# Patient Record
Sex: Female | Born: 1945
Health system: Southern US, Community
[De-identification: ages and names within clinical notes are randomized; demographics above are authoritative.]

## PROBLEM LIST (undated history)

## (undated) DIAGNOSIS — M25552 Pain in left hip: Secondary | ICD-10-CM

## (undated) DIAGNOSIS — H9319 Tinnitus, unspecified ear: Secondary | ICD-10-CM

## (undated) DIAGNOSIS — M26629 Arthralgia of temporomandibular joint, unspecified side: Secondary | ICD-10-CM

## (undated) DIAGNOSIS — E785 Hyperlipidemia, unspecified: Secondary | ICD-10-CM

## (undated) DIAGNOSIS — R42 Dizziness and giddiness: Secondary | ICD-10-CM

## (undated) DIAGNOSIS — R011 Cardiac murmur, unspecified: Secondary | ICD-10-CM

## (undated) DIAGNOSIS — I10 Essential (primary) hypertension: Secondary | ICD-10-CM

## (undated) DIAGNOSIS — M169 Osteoarthritis of hip, unspecified: Secondary | ICD-10-CM

## (undated) DIAGNOSIS — E559 Vitamin D deficiency, unspecified: Secondary | ICD-10-CM

## (undated) DIAGNOSIS — M8580 Other specified disorders of bone density and structure, unspecified site: Secondary | ICD-10-CM

## (undated) DIAGNOSIS — R413 Other amnesia: Secondary | ICD-10-CM

## (undated) HISTORY — DX: Arthralgia of temporomandibular joint, unspecified side: M26.629

## (undated) HISTORY — DX: Tinnitus, unspecified ear: H93.19

## (undated) HISTORY — PX: COLONOSCOPY: SHX174

## (undated) HISTORY — DX: Hyperlipidemia, unspecified: E78.5

## (undated) HISTORY — PX: TUBAL LIGATION: SHX77

## (undated) HISTORY — DX: Pain in left hip: M25.552

## (undated) HISTORY — DX: Other specified disorders of bone density and structure, unspecified site: M85.80

## (undated) HISTORY — DX: Other amnesia: R41.3

## (undated) HISTORY — DX: Cardiac murmur, unspecified: R01.1

## (undated) HISTORY — DX: Essential (primary) hypertension: I10

## (undated) HISTORY — DX: Vitamin D deficiency, unspecified: E55.9

---

## 1998-03-27 ENCOUNTER — Ambulatory Visit (HOSPITAL_COMMUNITY): Admission: RE | Admit: 1998-03-27 | Discharge: 1998-03-27 | Payer: Self-pay | Admitting: *Deleted

## 1999-04-10 ENCOUNTER — Other Ambulatory Visit: Admission: RE | Admit: 1999-04-10 | Discharge: 1999-04-10 | Payer: Self-pay | Admitting: *Deleted

## 1999-05-23 ENCOUNTER — Ambulatory Visit (HOSPITAL_COMMUNITY): Admission: RE | Admit: 1999-05-23 | Discharge: 1999-05-23 | Payer: Self-pay | Admitting: *Deleted

## 1999-07-17 ENCOUNTER — Emergency Department (HOSPITAL_COMMUNITY): Admission: EM | Admit: 1999-07-17 | Discharge: 1999-07-17 | Payer: Self-pay | Admitting: Emergency Medicine

## 2000-06-02 ENCOUNTER — Ambulatory Visit (HOSPITAL_COMMUNITY): Admission: RE | Admit: 2000-06-02 | Discharge: 2000-06-02 | Payer: Self-pay | Admitting: Internal Medicine

## 2000-06-02 ENCOUNTER — Encounter: Payer: Self-pay | Admitting: Internal Medicine

## 2001-10-10 ENCOUNTER — Encounter: Payer: Self-pay | Admitting: Internal Medicine

## 2001-10-10 ENCOUNTER — Ambulatory Visit (HOSPITAL_COMMUNITY): Admission: RE | Admit: 2001-10-10 | Discharge: 2001-10-10 | Payer: Self-pay | Admitting: Internal Medicine

## 2002-07-14 ENCOUNTER — Encounter: Payer: Self-pay | Admitting: Internal Medicine

## 2002-07-14 ENCOUNTER — Ambulatory Visit (HOSPITAL_COMMUNITY): Admission: RE | Admit: 2002-07-14 | Discharge: 2002-07-14 | Payer: Self-pay | Admitting: Internal Medicine

## 2004-10-31 ENCOUNTER — Encounter: Admission: RE | Admit: 2004-10-31 | Discharge: 2004-10-31 | Payer: Self-pay | Admitting: Neurology

## 2005-01-13 ENCOUNTER — Encounter: Admission: RE | Admit: 2005-01-13 | Discharge: 2005-01-13 | Payer: Self-pay | Admitting: Neurology

## 2006-02-22 ENCOUNTER — Ambulatory Visit (HOSPITAL_COMMUNITY): Admission: RE | Admit: 2006-02-22 | Discharge: 2006-02-22 | Payer: Self-pay | Admitting: Neurology

## 2007-09-24 ENCOUNTER — Emergency Department (HOSPITAL_COMMUNITY): Admission: EM | Admit: 2007-09-24 | Discharge: 2007-09-24 | Payer: Self-pay | Admitting: Family Medicine

## 2007-12-06 ENCOUNTER — Encounter: Admission: RE | Admit: 2007-12-06 | Discharge: 2007-12-06 | Payer: Self-pay | Admitting: Family Medicine

## 2010-06-05 LAB — HM PAP SMEAR: HM Pap smear: NEGATIVE

## 2011-02-18 ENCOUNTER — Emergency Department (HOSPITAL_COMMUNITY): Payer: Self-pay

## 2011-02-18 ENCOUNTER — Emergency Department (HOSPITAL_COMMUNITY)
Admission: EM | Admit: 2011-02-18 | Discharge: 2011-02-18 | Disposition: A | Discharge status: Other Acute Inpatient Hospital | Payer: Self-pay | Source: Home / Self Care | Attending: Emergency Medicine | Admitting: Emergency Medicine

## 2011-02-18 ENCOUNTER — Inpatient Hospital Stay (HOSPITAL_COMMUNITY)
Admission: EM | Admit: 2011-02-18 | Discharge: 2011-02-20 | DRG: 313 | Disposition: A | Discharge status: Home / Self Care | Payer: Self-pay | Source: Other Acute Inpatient Hospital | Attending: Cardiology | Admitting: Cardiology

## 2011-02-18 DIAGNOSIS — R0789 Other chest pain: Principal | ICD-10-CM | POA: Diagnosis present

## 2011-02-18 DIAGNOSIS — Z8249 Family history of ischemic heart disease and other diseases of the circulatory system: Secondary | ICD-10-CM

## 2011-02-18 DIAGNOSIS — R748 Abnormal levels of other serum enzymes: Secondary | ICD-10-CM | POA: Insufficient documentation

## 2011-02-18 DIAGNOSIS — I498 Other specified cardiac arrhythmias: Secondary | ICD-10-CM | POA: Insufficient documentation

## 2011-02-18 DIAGNOSIS — F17200 Nicotine dependence, unspecified, uncomplicated: Secondary | ICD-10-CM | POA: Diagnosis present

## 2011-02-18 DIAGNOSIS — M549 Dorsalgia, unspecified: Secondary | ICD-10-CM | POA: Insufficient documentation

## 2011-02-18 DIAGNOSIS — I10 Essential (primary) hypertension: Secondary | ICD-10-CM | POA: Insufficient documentation

## 2011-02-18 DIAGNOSIS — E7800 Pure hypercholesterolemia, unspecified: Secondary | ICD-10-CM | POA: Diagnosis present

## 2011-02-18 DIAGNOSIS — R079 Chest pain, unspecified: Secondary | ICD-10-CM | POA: Insufficient documentation

## 2011-02-18 DIAGNOSIS — K219 Gastro-esophageal reflux disease without esophagitis: Secondary | ICD-10-CM | POA: Diagnosis present

## 2011-02-18 LAB — CARDIAC PANEL(CRET KIN+CKTOT+MB+TROPI)
Relative Index: 3.5 — ABNORMAL HIGH (ref 0.0–2.5)
Troponin I: <0.3 ng/mL (ref <0.30)

## 2011-02-18 LAB — PROTIME-INR: INR: 0.89 (ref 0.00–1.49)

## 2011-02-18 LAB — CK TOTAL AND CKMB (NOT AT ARMC)
CK, MB: 6.4 ng/mL (ref 0.3–4.0)
Relative Index: 3.5 — ABNORMAL HIGH (ref 0.0–2.5)

## 2011-02-18 LAB — COMPREHENSIVE METABOLIC PANEL
AST: 31 U/L (ref 0–37)
Albumin: 4.4 g/dL (ref 3.5–5.2)
CO2: 26 mEq/L (ref 19–32)
GFR calc Af Amer: >60 mL/min (ref >60)
GFR calc non Af Amer: >60 mL/min (ref >60)
Potassium: 3.6 mEq/L (ref 3.5–5.1)

## 2011-02-18 LAB — DIFFERENTIAL
Lymphs Abs: 2.4 10*3/uL (ref 0.7–4.0)
Monocytes Absolute: 0.4 10*3/uL (ref 0.1–1.0)
Monocytes Relative: 7 % (ref 3–12)
Neutro Abs: 2.8 10*3/uL (ref 1.7–7.7)

## 2011-02-18 LAB — CBC
Hemoglobin: 13.9 g/dL (ref 12.0–15.0)
MCH: 32 pg (ref 26.0–34.0)
RBC: 4.34 MIL/uL (ref 3.87–5.11)
RDW: 13 % (ref 11.5–15.5)
WBC: 5.8 10*3/uL (ref 4.0–10.5)

## 2011-02-18 LAB — APTT: aPTT: 31 seconds (ref 24–37)

## 2011-02-18 LAB — TROPONIN I: Troponin I: <0.3 ng/mL (ref <0.30)

## 2011-02-19 ENCOUNTER — Inpatient Hospital Stay (HOSPITAL_COMMUNITY): Payer: Self-pay

## 2011-02-19 LAB — LIPID PANEL
HDL: 89 mg/dL (ref >39)
Total CHOL/HDL Ratio: 2.2 RATIO

## 2011-02-19 LAB — CARDIAC PANEL(CRET KIN+CKTOT+MB+TROPI)
CK, MB: 4.6 ng/mL — ABNORMAL HIGH (ref 0.3–4.0)
Relative Index: 3.4 — ABNORMAL HIGH (ref 0.0–2.5)

## 2011-02-19 LAB — CBC
MCH: 32 pg (ref 26.0–34.0)
MCV: 91.7 fL (ref 78.0–100.0)
RDW: 13 % (ref 11.5–15.5)
WBC: 5.7 10*3/uL (ref 4.0–10.5)

## 2011-02-19 LAB — BASIC METABOLIC PANEL
BUN: 9 mg/dL (ref 6–23)
CO2: 26 mEq/L (ref 19–32)
Creatinine, Ser: 0.7 mg/dL (ref 0.4–1.2)
Potassium: 3.5 mEq/L (ref 3.5–5.1)

## 2011-02-19 LAB — HEPARIN LEVEL (UNFRACTIONATED): Heparin Unfractionated: 0.94 IU/mL — ABNORMAL HIGH (ref 0.30–0.70)

## 2011-02-19 MED ORDER — TECHNETIUM TC 99M TETROFOSMIN IV KIT
30.0000 | PACK | Freq: Once | INTRAVENOUS | Status: AC | PRN
  Administered 2011-02-19: 30 via INTRAVENOUS

## 2011-02-19 MED ORDER — TECHNETIUM TC 99M TETROFOSMIN IV KIT
10.0000 | PACK | Freq: Once | INTRAVENOUS | Status: AC | PRN
  Administered 2011-02-19: 10 via INTRAVENOUS

## 2011-02-20 ENCOUNTER — Other Ambulatory Visit (HOSPITAL_COMMUNITY): Payer: Self-pay

## 2011-03-12 NOTE — Discharge Summary (Signed)
NAMESHIANNA, BALLY              ACCOUNT NO.:  [id number]  MEDICAL RECORD NO.:  [id number]           PATIENT TYPE:  I  LOCATION:  3740                         FACILITY:  [facility]  PHYSICIAN:  Eadie Repetto N. Sharyn Lull, M.D. DATE OF BIRTH:  05/23/1946  DATE OF ADMISSION:  02/18/2011 DATE OF DISCHARGE:  02/20/2011                              DISCHARGE SUMMARY   ADMITTING DIAGNOSES: 1. Chest pain, rule out myocardial infarction. 2. Uncontrolled hypertension. 3. Hypercholesteremia. 4. Tobacco abuse. 5. Positive family history of coronary artery disease.  DISCHARGE DIAGNOSES: 1. Status post atypical chest pain, myocardial infarction ruled out. 2. Negative Persantine Myoview. 3. Gastroesophageal reflux disease. 4. Hypertension. 5. Hypercholesteremia. 6. History of tobacco abuse. 7. Positive family history of coronary artery disease.  DISCHARGE HOME MEDICATIONS: 1. Percocet 1-2 tabs every 8 hours as needed for pain. 2. Lovastatin 20 mg 2 tablets daily by mouth at bedtime as before. 3. Nexium 40 mg 1 capsule daily. 4. Norvasc 10 mg 1 tablet daily. 5. Vitamin D2 50,000 units every week as before.  DIET:  Low salt, low cholesterol.  ACTIVITY:  As tolerated.  CONDITION:  At discharge, is table.  Follow up with me in 1 week.  BRIEF HISTORY AND HOSPITAL COURSE:  Ms. Debbra Riding is a 65 year old black female with past medical history significant for hypertension, hypercholesteremia.  She complained of right-sided chest pain, radiating to the back off and on since Sunday, states pain occasionally increases with movement, went to Urgent Care, was treated for GERD with Nexium without much relief, so decided to go to the ER.  The patient was noted to have minimally elevated CPK-MB and was transferred from [facility] to [facility] for further evaluation.  The patient denies any exertional chest pain or exertional dyspnea.  Denies PND, orthopnea, or leg swelling.  Denies palpitation,  lightheadedness, or syncope.  PAST MEDICAL HISTORY:  As above.  PAST SURGICAL HISTORY:  None.  ALLERGIES:  None.  MEDICATION AT HOME:  She was on amlodipine, Nexium, lovastatin, and vitamin D.  SOCIAL HISTORY:  She is married, has 2 children.  Smoked 1/2 pack per day for 25 years and now 1-2 cigarettes per day.  Drinks beer socially. 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  FAMILY HISTORY:  Mother died of massive MI at the age of 60.  One brother has heart problems.  She does not know about her dad.  PHYSICAL EXAMINATION:  GENERAL:  She was alert, awake, and oriented x3, in no acute distress. VITAL SIGNS:  Blood pressure was 141/127, pulse was 83. HEENT:  Conjunctivae pink. NECK:  Supple.  No JVD, no bruit. LUNGS:  Clear to auscultation without rhonchi or rales. CARDIOVASCULAR:  S1 and S2 was normal.  There was soft systolic murmur and S4 gallop. ABDOMEN:  Soft.  Bowel sounds were present, nontender. EXTREMITIES:  There is no clubbing, cyanosis, or edema.  LABS:  Hemoglobin was 13.9, hematocrit 40.4, white count of 5.8.  Sodium was 140, potassium 3.6, glucose 81, BUN 14, creatinine 0.89.  Two sets of cardiac enzymes were negative.  Cholesterol was 198, LDL was 94, HDL was 89.  Persantine  Myoview showed no evidence of reversible inducible ischemia with EF of 58%.  Chest x-ray showed no acute abnormalities.  BRIEF HOSPITAL COURSE:  The patient was admitted to telemetry unit.  MI was ruled out by serial enzymes and EKG.  The patient had vague right- sided musculoskeletal pain, was started on Percocet with relief of chest pain.  The patient underwent Persantine Myoview which showed no evidence of ischemia with normal ejection fraction.  The patient has been ambulating in hallway without any problems.  The patient will be discharged home on above medications and will be followed up in my office in 1 week.     Eduardo Osier. Sharyn Lull, M.D.     [facility]  D:  02/20/2011  T:  02/21/2011   Job:  [id number]  Electronically Signed by Rinaldo Cloud M.D. on 03/12/2011 09:42:33 AM

## 2011-07-29 ENCOUNTER — Other Ambulatory Visit: Payer: Self-pay | Admitting: Physician Assistant

## 2011-07-29 DIAGNOSIS — Z1231 Encounter for screening mammogram for malignant neoplasm of breast: Secondary | ICD-10-CM

## 2011-08-28 ENCOUNTER — Ambulatory Visit (HOSPITAL_COMMUNITY)
Admission: RE | Admit: 2011-08-28 | Discharge: 2011-08-28 | Disposition: A | Discharge status: Home / Self Care | Payer: Self-pay | Source: Ambulatory Visit | Attending: Physician Assistant | Admitting: Physician Assistant

## 2011-08-28 DIAGNOSIS — Z1231 Encounter for screening mammogram for malignant neoplasm of breast: Secondary | ICD-10-CM

## 2011-09-10 ENCOUNTER — Ambulatory Visit: Payer: Self-pay | Admitting: Physician Assistant

## 2011-09-10 DIAGNOSIS — I10 Essential (primary) hypertension: Secondary | ICD-10-CM

## 2011-09-10 DIAGNOSIS — E782 Mixed hyperlipidemia: Secondary | ICD-10-CM

## 2011-10-09 ENCOUNTER — Ambulatory Visit: Payer: Self-pay

## 2011-10-09 DIAGNOSIS — M545 Low back pain, unspecified: Secondary | ICD-10-CM

## 2011-10-09 DIAGNOSIS — S339XXA Sprain of unspecified parts of lumbar spine and pelvis, initial encounter: Secondary | ICD-10-CM

## 2012-02-05 ENCOUNTER — Ambulatory Visit: Payer: Self-pay | Admitting: Physician Assistant

## 2012-02-05 VITALS — BP 121/77 | HR 78 | Temp 98.1°F | Resp 18 | Ht 60.5 in | Wt 152.6 lb

## 2012-02-05 DIAGNOSIS — J329 Chronic sinusitis, unspecified: Secondary | ICD-10-CM

## 2012-02-05 DIAGNOSIS — R05 Cough: Secondary | ICD-10-CM

## 2012-02-05 DIAGNOSIS — J019 Acute sinusitis, unspecified: Secondary | ICD-10-CM

## 2012-02-05 DIAGNOSIS — N393 Stress incontinence (female) (male): Secondary | ICD-10-CM

## 2012-02-05 DIAGNOSIS — R059 Cough, unspecified: Secondary | ICD-10-CM

## 2012-02-05 MED ORDER — AMOXICILLIN 875 MG PO TABS: 1750.0000 mg | ORAL_TABLET | Freq: Two times a day (BID) | ORAL | Status: AC

## 2012-02-05 MED ORDER — GUAIFENESIN ER 1200 MG PO TB12: 1.0000 | ORAL_TABLET | Freq: Two times a day (BID) | ORAL | Status: DC | PRN

## 2012-02-05 MED ORDER — IPRATROPIUM BROMIDE 0.03 % NA SOLN: 2.0000 | Freq: Two times a day (BID) | NASAL | Status: DC

## 2012-02-05 MED ORDER — BENZONATATE 100 MG PO CAPS: 100.0000 mg | ORAL_CAPSULE | Freq: Three times a day (TID) | ORAL | Status: AC | PRN

## 2012-02-05 NOTE — Patient Instructions (Signed)
Get LOTS of rest and drink at least 64 ounces of water daily.  QUIT SMOKING!!!

## 2012-02-05 NOTE — Progress Notes (Signed)
  Subjective:    Patient ID: Brandi Harrington, female    DOB: [birth year], 66 y.o.   MRN: [id number]  HPI  Presents with 7 days of nasal/sinus congestion, fatigue and cough.  Cough produces white sputum.  Nasal mucous is light yellow.  No HA.  No fever or chills.  No diarrhea.  Urinary leakage with coughing.  Review of Systems As above.    Objective:   Physical Exam  Vital signs noted. Well-developed, well nourished BF who is awake, alert and oriented, in NAD. HEENT: /AT, PERRL, EOMI.  Sclera and conjunctiva are clear.  EAC are patent, TMs are normal in appearance. Nasal mucosa is pink and moist. OP is clear. Neck: supple, non-tender, no lymphadenopathey, thyromegaly. Heart: RRR, no murmur Lungs: with coarse wheezes throughout. Abdomen: normo-active bowel sounds, supple, non-tender, no mass or organomegaly. Extremities: no cyanosis, clubbing or edema. Skin: warm and dry without rash. Lipoma 5 cm x 6 cm on the posterior left shoulder.     Assessment & Plan:   1. Sinusitis  ipratropium (ATROVENT) 0.03 % nasal spray, Guaifenesin (MUCINEX MAXIMUM STRENGTH) 1200 MG TB12, amoxicillin (AMOXIL) 875 MG tablet  2. Cough  benzonatate (TESSALON) 100 MG capsule  3. Urinary, incontinence, stress female     Patient Instructions  Get LOTS of rest and drink at least 64 ounces of water daily.  QUIT SMOKING!!!

## 2012-03-14 ENCOUNTER — Other Ambulatory Visit: Payer: Self-pay | Admitting: Physician Assistant

## 2012-03-28 ENCOUNTER — Telehealth: Payer: Self-pay

## 2012-03-28 NOTE — Telephone Encounter (Signed)
Pt called and states she is out of cholesterol med and prescribed vitamin. She wants to know if she has to come in for a refill Please call pt to advise

## 2012-03-29 MED ORDER — LOVASTATIN 40 MG PO TABS: 40.0000 mg | ORAL_TABLET | Freq: Every day | ORAL | Status: DC

## 2012-03-29 NOTE — Telephone Encounter (Signed)
LMOM THAT RX SENT INTO PHARMACY AND THAT SHE WILL NEED OV FOR MORE.

## 2012-03-29 NOTE — Telephone Encounter (Signed)
rx for lovastatin sent into pharmacy but pt needs office visit

## 2012-05-12 ENCOUNTER — Encounter: Payer: Self-pay | Admitting: Physician Assistant

## 2012-05-12 ENCOUNTER — Ambulatory Visit: Payer: Self-pay | Admitting: Physician Assistant

## 2012-05-12 VITALS — BP 130/90 | HR 71 | Temp 98.8°F | Resp 16 | Ht 61.0 in | Wt 150.2 lb

## 2012-05-12 DIAGNOSIS — E559 Vitamin D deficiency, unspecified: Secondary | ICD-10-CM | POA: Insufficient documentation

## 2012-05-12 DIAGNOSIS — E785 Hyperlipidemia, unspecified: Secondary | ICD-10-CM

## 2012-05-12 DIAGNOSIS — M8580 Other specified disorders of bone density and structure, unspecified site: Secondary | ICD-10-CM | POA: Insufficient documentation

## 2012-05-12 DIAGNOSIS — M26629 Arthralgia of temporomandibular joint, unspecified side: Secondary | ICD-10-CM | POA: Insufficient documentation

## 2012-05-12 DIAGNOSIS — I10 Essential (primary) hypertension: Secondary | ICD-10-CM

## 2012-05-12 MED ORDER — LOVASTATIN 40 MG PO TABS: 40.0000 mg | ORAL_TABLET | Freq: Every day | ORAL | Status: DC

## 2012-05-12 MED ORDER — AMLODIPINE BESYLATE 10 MG PO TABS: 10.0000 mg | ORAL_TABLET | Freq: Every day | ORAL | Status: DC

## 2012-05-12 MED ORDER — VITAMIN D (ERGOCALCIFEROL) 1.25 MG (50000 UNIT) PO CAPS: 50000.0000 [IU] | ORAL_CAPSULE | ORAL | Status: DC

## 2012-05-12 NOTE — Progress Notes (Signed)
Subjective:    Patient ID: Brandi Harrington, female    DOB: 06-17-1946, 66 y.o.   MRN: [id number]  HPI This 66 y.o. female presents for evaluation of hypertension and hyperlipidemia.  She is still not insured, and is experiencing significant financial strain.   She needs refills of her regular medications.  She has not been able to afford the shingles vaccine yet, and I think it's appropriate to defer that until she qualifies for Medicare next year.   Review of Systems No chest pain, SOB, HA, dizziness, vision change, N/V, diarrhea, constipation, dysuria, urinary urgency or frequency, myalgias, arthralgias or rash.    Past Medical History  Diagnosis Date  . Hypertension   . Hyperlipidemia   . Osteopenia   . TMJ syndrome   . Vitamin d deficiency     Past Surgical History  Procedure Date  . Tubal ligation     Prior to Admission medications   Medication Sig Start Date End Date Taking? Authorizing Provider  amLODipine (NORVASC) 10 MG tablet Take 10 mg by mouth daily.   Yes Historical Provider, MD  CALCIUM PO Take by mouth daily.   Yes Historical Provider, MD  fish oil-omega-3 fatty acids 1000 MG capsule Take 2 g by mouth daily.   Yes Historical Provider, MD  lovastatin (MEVACOR) 40 MG tablet Take 1 tablet (40 mg total) by mouth at bedtime. 03/29/12  Yes Heather Jaquita Rector, PA-C  Multiple Vitamin (MULTIVITAMIN) tablet Take 1 tablet by mouth daily.   Yes Historical Provider, MD  Vitamin D, Ergocalciferol, (DRISDOL) 50000 UNITS CAPS Take 50,000 Units by mouth every 7 (seven) days.   Yes Historical Provider, MD    No Known Allergies  History   Social History  . Marital Status: Married    Spouse Name: N/A    Number of Children: 2  . Years of Education: N/A   Occupational History  . Owner     [employer]   Social History Main Topics  . Smoking status: Current Some Day Smoker -- 0.3 packs/day    Types: Cigarettes  . Smokeless tobacco: Never Used  . Alcohol Use: Yes  . Drug  Use: No  . Sexually Active: Yes -- Female partner(s)    Birth Control/ Protection: Post-menopausal   Other Topics Concern  . Not on file   Social History Narrative   House on the [place] Chapter 13 bankruptcy (not her business)-couldn't get a loan modification.  Husband s/p CVA x several, residual left hemiparesis, DM diagnosed at age 67, ASCVD (severe), s/p CABG     Family History  Problem Relation Age of Onset  . Diabetes Sister   . Scoliosis Sister   . Arthritis Sister   . Alcohol abuse Daughter   . Heart disease Son     dysrrhythmia       Objective:   Physical Exam  Blood pressure 130/90, pulse 71, temperature 98.8 F (37.1 C), temperature source Oral, resp. rate 16, height 5\' 1"  (1.549 m), weight 150 lb 3.2 oz (68.13 kg), SpO2 100.00%. Body mass index is 28.38 kg/(m^2). Well-developed, well nourished BF who is awake, alert and oriented, in NAD. HEENT: [place]/AT, sclera and conjunctiva are clear.   Neck: supple, non-tender, no lymphadenopathy, thyromegaly. Heart: RRR, no murmur Lungs: normal effort, CTA Extremities: no cyanosis, clubbing or edema. Skin: warm and dry without rash.  Labs deferred.  Labs in 08/2011 were normal.     Assessment & Plan:   1. HTN (hypertension)  amLODipine (NORVASC) 10  MG tablet  2. Hyperlipidemia  lovastatin (MEVACOR) 40 MG tablet  3. Vitamin d deficiency  Vitamin D, Ergocalciferol, (DRISDOL) 50000 UNITS CAPS

## 2012-05-12 NOTE — Patient Instructions (Signed)
Keep your sense of humor!  Plan to get the shingles vaccine and update your labs once you turn 67.

## 2012-08-05 ENCOUNTER — Other Ambulatory Visit: Payer: Self-pay | Admitting: Physician Assistant

## 2012-08-29 ENCOUNTER — Other Ambulatory Visit: Payer: Self-pay | Admitting: Physician Assistant

## 2012-08-29 DIAGNOSIS — Z1231 Encounter for screening mammogram for malignant neoplasm of breast: Secondary | ICD-10-CM

## 2012-10-03 ENCOUNTER — Ambulatory Visit (HOSPITAL_COMMUNITY)
Admission: RE | Admit: 2012-10-03 | Discharge: 2012-10-03 | Disposition: A | Discharge status: Home / Self Care | Payer: Self-pay | Source: Ambulatory Visit | Attending: Physician Assistant | Admitting: Physician Assistant

## 2012-10-03 DIAGNOSIS — Z1231 Encounter for screening mammogram for malignant neoplasm of breast: Secondary | ICD-10-CM

## 2012-12-25 ENCOUNTER — Other Ambulatory Visit: Payer: Self-pay | Admitting: Physician Assistant

## 2013-02-02 ENCOUNTER — Telehealth: Payer: Self-pay

## 2013-02-02 NOTE — Telephone Encounter (Signed)
Pt set up an appointment to see Chelle on May 22.  She needs a refill on her medication though.  Says she is completely out.  Can we refill?

## 2013-02-03 MED ORDER — AMLODIPINE BESYLATE 10 MG PO TABS: ORAL_TABLET | ORAL | Status: DC

## 2013-02-03 MED ORDER — LOVASTATIN 20 MG PO TABS: ORAL_TABLET | ORAL | Status: DC

## 2013-02-03 NOTE — Telephone Encounter (Signed)
Sent in

## 2013-02-09 ENCOUNTER — Ambulatory Visit (INDEPENDENT_AMBULATORY_CARE_PROVIDER_SITE_OTHER): Payer: Medicare Other | Admitting: Physician Assistant

## 2013-02-09 ENCOUNTER — Encounter: Payer: Self-pay | Admitting: Physician Assistant

## 2013-02-09 VITALS — BP 116/76 | HR 96 | Temp 98.6°F | Resp 16 | Ht 61.0 in | Wt 145.6 lb

## 2013-02-09 DIAGNOSIS — I10 Essential (primary) hypertension: Secondary | ICD-10-CM

## 2013-02-09 DIAGNOSIS — M8580 Other specified disorders of bone density and structure, unspecified site: Secondary | ICD-10-CM

## 2013-02-09 DIAGNOSIS — E785 Hyperlipidemia, unspecified: Secondary | ICD-10-CM

## 2013-02-09 DIAGNOSIS — Z1211 Encounter for screening for malignant neoplasm of colon: Secondary | ICD-10-CM

## 2013-02-09 DIAGNOSIS — Z1159 Encounter for screening for other viral diseases: Secondary | ICD-10-CM

## 2013-02-09 DIAGNOSIS — E559 Vitamin D deficiency, unspecified: Secondary | ICD-10-CM

## 2013-02-09 DIAGNOSIS — M899 Disorder of bone, unspecified: Secondary | ICD-10-CM

## 2013-02-09 DIAGNOSIS — M949 Disorder of cartilage, unspecified: Secondary | ICD-10-CM

## 2013-02-09 DIAGNOSIS — Z23 Encounter for immunization: Secondary | ICD-10-CM

## 2013-02-09 MED ORDER — LOVASTATIN 40 MG PO TABS: 40.0000 mg | ORAL_TABLET | Freq: Every day | ORAL | Status: DC

## 2013-02-09 MED ORDER — ZOSTER VACCINE LIVE 19400 UNT/0.65ML ~~LOC~~ SOLR: 0.6500 mL | Freq: Once | SUBCUTANEOUS | Status: DC

## 2013-02-09 MED ORDER — AMLODIPINE BESYLATE 10 MG PO TABS: ORAL_TABLET | ORAL | Status: DC

## 2013-02-09 NOTE — Progress Notes (Signed)
  Subjective:    Patient ID: Brandi Harrington, female    DOB: 1946/05/06, 67 y.o.   MRN: [id number]  HPI  This 67 y.o. female presents for evaluation of HTN, Hyperlipidemia and vitamin D deficiency. Has filed Bankruptcy, chapter 13.  More relaxed, less stressed.  Tolerating her medications well without adverse effects.  Has health coverage now, for the first time since I've known her.  She's ready to start getting some health maintenance items completed.  Past medical history, surgical history, family history, social history and problem list reviewed.   Review of Systems Denies chest pain, shortness of breath, HA, dizziness, vision change, nausea, vomiting, diarrhea, constipation, melena, hematochezia, dysuria, increased urinary urgency or frequency, increased hunger or thirst, unintentional weight change, unexplained myalgias or arthralgias, rash.     Objective:   Physical Exam Blood pressure 116/76, pulse 96, temperature 98.6 F (37 C), temperature source Oral, resp. rate 16, height 5\' 1"  (1.549 m), weight 145 lb 9.6 oz (66.044 kg), SpO2 100.00%. Body mass index is 27.53 kg/(m^2). Well-developed, well nourished BF who is awake, alert and oriented, in NAD. HEENT: [place]/AT, PERRL, EOMI.  Sclera and conjunctiva are clear.  EAC are patent, TMs are normal in appearance. Nasal mucosa is pink and moist. OP is clear. Neck: supple, non-tender, no lymphadenopathy, thyromegaly. Heart: RRR, no murmur Lungs: normal effort, CTA Extremities: no cyanosis, clubbing or edema. Skin: warm and dry without rash. Psychologic: good mood and appropriate affect, normal speech and behavior.     Assessment & Plan:  HTN (hypertension) - Plan: CBC with Differential, Comprehensive metabolic panel, TSH, amLODipine (NORVASC) 10 MG tablet  Hyperlipidemia - Plan: Lipid panel, lovastatin (MEVACOR) 40 MG tablet  Osteopenia - Plan: DG Bone Density  Vitamin D deficiency - Plan: Vitamin D 25 hydroxy, DG Bone  Density  Need for hepatitis C screening test - Plan: Hepatitis C antibody  Screening for colon cancer - Plan: CANCELED: Ambulatory referral to Gastroenterology (not due for several more years)  Need for shingles vaccine - Plan: zoster vaccine live, PF, (ZOSTAVAX) 46962 UNT/0.65ML injection  Fernande Bras, PA-C Physician Assistant-Certified [facility]

## 2013-02-09 NOTE — Patient Instructions (Signed)
When you return, make sure the polish is off your RIGHT thumb nail so we can look at it.

## 2013-02-10 LAB — COMPREHENSIVE METABOLIC PANEL
CO2: 23 mEq/L (ref 19–32)
Calcium: 9.7 mg/dL (ref 8.4–10.5)
Creat: 1.1 mg/dL (ref 0.50–1.10)
Glucose, Bld: 74 mg/dL (ref 70–99)
Total Bilirubin: 0.4 mg/dL (ref 0.3–1.2)
Total Protein: 7.3 g/dL (ref 6.0–8.3)

## 2013-02-10 LAB — LIPID PANEL
Cholesterol: 216 mg/dL — ABNORMAL HIGH (ref 0–200)
Total CHOL/HDL Ratio: 3.1 Ratio
Triglycerides: 90 mg/dL (ref <150)
VLDL: 18 mg/dL (ref 0–40)

## 2013-02-10 LAB — CBC WITH DIFFERENTIAL/PLATELET
Eosinophils Relative: 6 % — ABNORMAL HIGH (ref 0–5)
HCT: 38.6 % (ref 36.0–46.0)
Hemoglobin: 13.4 g/dL (ref 12.0–15.0)
MCH: 31.7 pg (ref 26.0–34.0)
MCV: 91.3 fL (ref 78.0–100.0)
Monocytes Relative: 6 % (ref 3–12)
RBC: 4.23 MIL/uL (ref 3.87–5.11)

## 2013-02-10 LAB — HEPATITIS C ANTIBODY: HCV Ab: REACTIVE — AB

## 2013-02-15 ENCOUNTER — Telehealth: Payer: Self-pay | Admitting: Physician Assistant

## 2013-02-15 DIAGNOSIS — R768 Other specified abnormal immunological findings in serum: Secondary | ICD-10-CM

## 2013-02-15 DIAGNOSIS — E559 Vitamin D deficiency, unspecified: Secondary | ICD-10-CM

## 2013-02-15 NOTE — Telephone Encounter (Signed)
Brandi Harrington, patient states you tried to call her, I see an open phone message, from today, but can not view your documentation, do you want me to call her? [phone]

## 2013-02-15 NOTE — Telephone Encounter (Signed)
Reviewed results with patient. 1. Insurance will not pay for prescription dose vitamin D, but as her level has risen to 69, she'll switch to OTC 2000 IU daily. 2. Lipids have increased.  Advised her to increase the Fish Oil she takes from 1 daily to 2 daily. 3. Hepatitis C screening was POSITIVE.  Referral to Hepatitis Clinic for additional evaluation/treatment.  She notes that she hasn't heard yet about the DEXA or GI referrals.  I'll forward this to my referrals staff to help with the Hepatitis Clinic referral and the others.

## 2013-02-28 ENCOUNTER — Encounter: Payer: Self-pay | Admitting: Physician Assistant

## 2013-05-10 ENCOUNTER — Ambulatory Visit (INDEPENDENT_AMBULATORY_CARE_PROVIDER_SITE_OTHER): Payer: Medicare Other | Admitting: Family Medicine

## 2013-05-10 VITALS — BP 100/80 | HR 72 | Temp 98.1°F | Resp 16 | Ht 60.0 in | Wt 147.6 lb

## 2013-05-10 DIAGNOSIS — Z1211 Encounter for screening for malignant neoplasm of colon: Secondary | ICD-10-CM

## 2013-05-10 DIAGNOSIS — L309 Dermatitis, unspecified: Secondary | ICD-10-CM

## 2013-05-10 DIAGNOSIS — L608 Other nail disorders: Secondary | ICD-10-CM

## 2013-05-10 DIAGNOSIS — L601 Onycholysis: Secondary | ICD-10-CM

## 2013-05-10 DIAGNOSIS — L259 Unspecified contact dermatitis, unspecified cause: Secondary | ICD-10-CM

## 2013-05-10 MED ORDER — CLOBETASOL PROPIONATE 0.05 % EX CREA: TOPICAL_CREAM | Freq: Two times a day (BID) | CUTANEOUS | Status: DC

## 2013-05-10 NOTE — Progress Notes (Signed)
  Subjective:    Patient ID: Brandi Harrington, female    DOB: Aug 04, 1946, 67 y.o.   MRN: [id number]  HPI  This 67 y.o. female presents for evaluation of a rash on her arm and to discuss the discoloration of her RIGHT thumb nail.  3 weeks of an itchy rash on the LEFT neck and arm.  Neosporin without benefit. Has tried no antihistamines.  No new skin or cleaning products.  No new pets.  No pain.  No drainage.  Recall that the discoloration of her nail is suspected due to onychomycosis.  She generally covers it with polish.  The mail has begun to lift off the finger, and she has episodically had some pain the past few days.  Has not yet heard from referrals to Hepatitis Clinic or Gastroenterology, made in late May.   Did receive the Shingles vaccine, but did not bring the documentation with her.   Saw her cardiologist, who noted her BP was elevated, and prescribed Lisinopril 20 mg in addition to the amlodipine 10 mg she takes daily.  She filled the prescription, but hasn't started it. She has a home BP cuff, but doesn't use it regularly, "I can tell when my blood pressure is up."  Working on quitting smoking.  None today, estimates only 3 cigarettes in the past week.  Review of Systems No chest pain, SOB, HA, dizziness, vision change, N/V, diarrhea, constipation, dysuria, urinary urgency or frequency, myalgias, arthralgias or rash.    Objective:   Physical Exam Blood pressure 100/80, pulse 72, temperature 98.1 F (36.7 C), temperature source Oral, resp. rate 16, height 5' (1.524 m), weight 147 lb 9.6 oz (66.951 kg), SpO2 98.00%. Body mass index is 28.83 kg/(m^2). Well-developed, well nourished BF who is awake, alert and oriented, in NAD. HEENT: [place]/AT, sclera and conjunctiva are clear.   Neck: supple, non-tender, no lymphadenopathy, thyromegaly. Heart: RRR, no murmur Lungs: normal effort, CTA Extremities: no cyanosis, clubbing or edema. Skin: warm and dry. Normopigmented raised lesions  clustered at the base of the LEFT neck and along the LEFT arm.  At first blush, these appear vesicular, but are not. No crusting or scaling.  No drainage. No erythema. Lesions on the arm are excoriated. Nail on the RIGHT thumb is hyperpigmented, hypertrophic and 50% lysed from the nailbed. Non-tender. Psychologic: good mood and appropriate affect, normal speech and behavior.       Assessment & Plan:  Dermatitis - doubt shingles. Plan: clobetasol cream (TEMOVATE) 0.05 %  Onycholysis - she'll return for nail removal at her convenience  Screening for colon cancer - Plan: Ambulatory referral to Gastroenterology (per my referrals staff, the Hepatitis made several attempts to contact and schedule the patient, but she did not call them back.  The GI referral was deemed "completed" under the assumption that the Hepatitis and GI referrals were duplicate. We are re-ordering the GI referral and will advise the patient to call the Hepatitis Clinic to schedule.).  Discussed with Dr. Katrinka Blazing, who also evaluated the patient.  Fernande Bras, PA-C Physician Assistant-Certified [facility]

## 2013-05-16 ENCOUNTER — Ambulatory Visit (INDEPENDENT_AMBULATORY_CARE_PROVIDER_SITE_OTHER): Payer: Medicare Other | Admitting: Physician Assistant

## 2013-05-16 VITALS — BP 124/72 | HR 66 | Temp 98.7°F | Resp 18 | Ht 60.0 in | Wt 150.0 lb

## 2013-05-16 DIAGNOSIS — L259 Unspecified contact dermatitis, unspecified cause: Secondary | ICD-10-CM

## 2013-05-16 DIAGNOSIS — L601 Onycholysis: Secondary | ICD-10-CM

## 2013-05-16 DIAGNOSIS — L309 Dermatitis, unspecified: Secondary | ICD-10-CM

## 2013-05-16 DIAGNOSIS — L608 Other nail disorders: Secondary | ICD-10-CM

## 2013-05-16 NOTE — Progress Notes (Signed)
  Subjective:    Patient ID: Brandi Harrington, female    DOB: [birth year], 67 y.o.   MRN: [id number]  HPI  This 67 y.o. female presents for evaluation of a rash on the LEFT neck and arm, and for removal of the LEFT thumb nail due to discoloration and lifting of the nail resulting in pain. She was seen last week with the itchy rash.  She reports it is much better with supportive care and no longer bothers her. She is ready to have the thumb nail removed.  Medications, allergies, past medical history, surgical history, family history, social history and problem list reviewed.  Review of Systems As above.    Objective:   Physical Exam  BP 124/72  Pulse 66  Temp(Src) 98.7 F (37.1 C) (Oral)  Resp 18  Ht 5' (1.524 m)  Wt 150 lb (68.04 kg)  BMI 29.3 kg/m2  SpO2 99% Skin: warm and dry. Normopigmented raised lesions clustered at the base of the LEFT neck and along the LEFT arm are resolving. No crusting or scaling. No drainage. No erythema. Lesions on the arm are excoriated. Nail on the RIGHT thumb is hyperpigmented, hypertrophic and 50% lysed from the nailbed. Mildly tender.  PROCEDURE: Verbal consent obtained. Digital block with 4 cc 2% lidocaine plain.  SP&D.  Entire nail lifted and removed.  Xeroform placed.  Cleansed and dressed.      Assessment & Plan:  Dermatitis -resolving.  Continue supportive care until completely resolved.  Onycholysis/hyperpigmentation - nail removed. Local wound care reviewed and provided.  Fernande Bras, PA-C Physician Assistant-Certified [facility]

## 2013-05-16 NOTE — Patient Instructions (Signed)
NAIL Removal care . Keep area clean, dry and bandaged for 24 hours. . After 24 hours, remove outer bandage and leave yellow gauze in place. . Soak finger in warm soapy water for 5-10 minutes, once daily for 5 days. Rebandage finger after each Cleaning. A simple bandaid is adequate. . Continue soaks until yellow gauze falls off. . Notify the office if you experience any of the following signs of infection: Swelling, redness, pus drainage, streaking, fever > 101.0 F

## 2013-05-30 NOTE — Progress Notes (Signed)
History and physical exam reviewed in detail with Porfirio Oar, PA-C.  Patient examined by myself as well. Agree with A/P.

## 2013-07-07 ENCOUNTER — Encounter: Payer: Self-pay | Admitting: Physician Assistant

## 2013-08-11 ENCOUNTER — Ambulatory Visit (INDEPENDENT_AMBULATORY_CARE_PROVIDER_SITE_OTHER): Payer: Medicare Other | Admitting: Emergency Medicine

## 2013-08-11 ENCOUNTER — Ambulatory Visit: Payer: Medicare Other

## 2013-08-11 VITALS — BP 140/82 | HR 64 | Temp 98.4°F | Resp 18 | Ht 61.5 in | Wt 149.0 lb

## 2013-08-11 DIAGNOSIS — M25552 Pain in left hip: Secondary | ICD-10-CM

## 2013-08-11 DIAGNOSIS — M79675 Pain in left toe(s): Secondary | ICD-10-CM

## 2013-08-11 DIAGNOSIS — M25559 Pain in unspecified hip: Secondary | ICD-10-CM

## 2013-08-11 DIAGNOSIS — M79609 Pain in unspecified limb: Secondary | ICD-10-CM

## 2013-08-11 MED ORDER — TRAMADOL HCL 50 MG PO TABS: 50.0000 mg | ORAL_TABLET | Freq: Three times a day (TID) | ORAL | Status: DC | PRN

## 2013-08-11 NOTE — Progress Notes (Addendum)
This chart was scribed for Brandi Chris, MD by [name], ED Scribe. This patient was seen in room Room/bed 11 and the patient's care was started at 1:10 PM.  Subjective:    Patient ID: Brandi Harrington, female    DOB: 09-Mar-1946, 67 y.o.   MRN: [id number] Chief Complaint  Patient presents with  . Hypertension    150/87 this morning   . big toe pain    x1 week   . left hip pain    x1 week   HPI Brandi Harrington is a 67 y.o. female who presents to the [facility] complaining of L 1st toe pain and soreness that began yesterday. Pt denies injury to L toe. Pt denies hx of gout. Pt denies having new shoes recently.  Pt also complains of L sided hip pain. She states pain worsens when she applies pressure. Pt states the pain is above the femur bone near the groin area. Pt denies R side hip pain. Pt denies having lower back pain. She states her pain can be described as "sharp".  Pt denies having her flu shot. Pt states she has an apt on August 24, 2013 to see her PCP and will get her shot at that time.  Pt is also concerned about HTN due to elevated BP.  Review of Systems  All other systems reviewed and are negative.   Objective:   Physical Exam CONSTITUTIONAL: Well developed/well nourished HEAD: Normocephalic/atraumatic EYES: EOMI/PERRL ENMT: Mucous membranes moist NECK: supple no meningeal signs SPINE:entire spine nontender CV: S1/S2 noted, no murmurs/rubs/gallops noted LUNGS: Lungs are clear to auscultation bilaterally, no apparent distress ABDOMEN: soft, nontender, no rebound or guarding GU:no cva tenderness NEURO: Pt is awake/alert, moves all extremitiesx4 EXTREMITIES: pulses normal, full ROM patient has pain in her left groin area when the left hip is placed into full flexion. She does not have any tenderness in her back and straight leg raising is negative she has full range of motion of the left hip. She also has tenderness over the left great toe and pain in the great  toe with extension. There's No Swelling to SKIN: warm, color normal PSYCH: no abnormalities of mood noted [facility] reading (PRIMARY) by  Dr. Cleta Alberts films are normal. Examination of left hip reveals some vascular calcification in the pelvis but no acute problems with the hip   BP while in room (pt seated): L arm 144/90 & R arm 140/80  Triage Vitals:BP 140/82  Pulse 64  Temp(Src) 98.4 F (36.9 C) (Oral)  Resp 18  Ht 5' 1.5" (1.562 m)  Wt 149 lb (67.586 kg)  BMI 27.70 kg/m2  SpO2 98% Assessment & Plan:  Patient will follow up with show on December 4. No labs were done. We'll give her Ultram to take for severe pain otherwise she is to take extra strength Tylenol 2 twice a day. Her blood pressure improved while she was here and no blood pressure medicine adjustments were made to  I personally performed the services described in this documentation, which was scribed in my presence. The recorded information has been reviewed and is accurate.

## 2013-08-24 ENCOUNTER — Ambulatory Visit (INDEPENDENT_AMBULATORY_CARE_PROVIDER_SITE_OTHER): Payer: Medicare Other | Admitting: Physician Assistant

## 2013-08-24 ENCOUNTER — Encounter: Payer: Self-pay | Admitting: Physician Assistant

## 2013-08-24 VITALS — BP 136/82 | HR 82 | Temp 97.1°F | Resp 16 | Ht 61.0 in | Wt 151.2 lb

## 2013-08-24 DIAGNOSIS — R768 Other specified abnormal immunological findings in serum: Secondary | ICD-10-CM

## 2013-08-24 DIAGNOSIS — I10 Essential (primary) hypertension: Secondary | ICD-10-CM

## 2013-08-24 DIAGNOSIS — E785 Hyperlipidemia, unspecified: Secondary | ICD-10-CM

## 2013-08-24 DIAGNOSIS — R894 Abnormal immunological findings in specimens from other organs, systems and tissues: Secondary | ICD-10-CM

## 2013-08-24 DIAGNOSIS — Z23 Encounter for immunization: Secondary | ICD-10-CM

## 2013-08-24 MED ORDER — LOVASTATIN 40 MG PO TABS: 40.0000 mg | ORAL_TABLET | Freq: Every day | ORAL | Status: DC

## 2013-08-24 MED ORDER — AMLODIPINE BESYLATE 10 MG PO TABS: ORAL_TABLET | ORAL | Status: DC

## 2013-08-24 NOTE — Patient Instructions (Signed)
Proceed with the liver specialists this month, as planned.  Get your colonoscopy after the first of the year. You also need a Bone Density Test.  I ordered it for [facility].  Please call and schedule it.

## 2013-08-24 NOTE — Progress Notes (Signed)
   Subjective:    Patient ID: Brandi Harrington, female    DOB: 01/10/1946, 67 y.o.   MRN: [id number]  Chief Complaint  Patient presents with  . Medication Refill  . Hypertension  . Toe Pain   Medications, allergies, past medical history, surgical history, family history, social history and problem list reviewed and updated.  Patient Active Problem List   Diagnosis Date Noted  . Hepatitis C antibody test positive 02/15/2013  . HTN (hypertension) 05/12/2012  . Hyperlipidemia 05/12/2012  . Osteopenia   . TMJ syndrome   . Vitamin D deficiency     HPI  Sees the Hepatology clinic 09/06/2013 for evaluation of the positive Hep C screening test. To have a colonoscopy after the new year. Also due for DEXA (ordered in 01/2013).  Husband (from whom she is now separated) is now in assisted living.  Her daughter is living with her, and is "really wrecking my nerves." "She's really pushing my buttons."  Is going to evict her.  Is cutting back on tobacco and alcohol.  None of either in 3 days.  LEFT hip pain is much better, with OTC acetaminophen.  The toe pain is better, too, but still has intermittent pain with certain movement ("like when I pull it to the RIGHT.").  Review of Systems No chest pain, SOB, HA, dizziness, vision change, N/V, diarrhea, constipation, dysuria, urinary urgency or frequency or rash.     Objective:   Physical Exam  Blood pressure 136/82, pulse 82, temperature 97.1 F (36.2 C), temperature source Oral, resp. rate 16, height 5\' 1"  (1.549 m), weight 151 lb 3.2 oz (68.584 kg), SpO2 98.00%. Body mass index is 28.58 kg/(m^2). Well-developed, well nourished BF who is awake, alert and oriented, in NAD. HEENT: J[place]re/AT, PERRL, EOMI.  Sclera and conjunctiva are clear.  EAC are patent, TMs are normal in appearance. Nasal mucosa is pink and moist. OP is clear. Neck: supple, non-tender, no lymphadenopathy, thyromegaly. Heart: RRR, no murmur Lungs: normal effort, CTA Abdomen:  normo-active bowel sounds, supple, non-tender, no mass or organomegaly. Extremities: no cyanosis, clubbing or edema. LEFT hip with FROM.  Non-tender. The LEFT great toe is normal on inspection and palpation.  Tenderness with abduction. Skin: warm and dry without rash. Psychologic: good mood and appropriate affect, normal speech and behavior.       Assessment & Plan:  1. HTN (hypertension) Controlled/stable.  Continue current treatment. - CBC with Differential - Comprehensive metabolic panel - amLODipine (NORVASC) 10 MG tablet; TAKE ONE TABLET BY MOUTH EVERY DAY  Dispense: 90 tablet; Refill: 1  2. Hyperlipidemia Await labs. - Lipid panel - lovastatin (MEVACOR) 40 MG tablet; Take 1 tablet (40 mg total) by mouth at bedtime.  Dispense: 90 tablet; Refill: 1  3. Need for prophylactic vaccination and inoculation against influenza - Flu Vaccine QUAD 36+ mos IM  4. Hepatitis C antibody test positive Proceed with appointment at Hepatitis Clinic.   Fernande Bras, PA-C Physician Assistant-Certified [facility]

## 2013-08-25 ENCOUNTER — Other Ambulatory Visit: Payer: Self-pay | Admitting: Physician Assistant

## 2013-08-25 LAB — LIPID PANEL
Cholesterol: 206 mg/dL — ABNORMAL HIGH (ref 0–200)
HDL: 80 mg/dL (ref >39)
LDL Cholesterol: 109 mg/dL — ABNORMAL HIGH (ref 0–99)
Total CHOL/HDL Ratio: 2.6 Ratio
Triglycerides: 86 mg/dL (ref <150)
VLDL: 17 mg/dL (ref 0–40)

## 2013-08-25 LAB — CBC WITH DIFFERENTIAL/PLATELET
Basophils Relative: 0 % (ref 0–1)
Eosinophils Absolute: 0.4 10*3/uL (ref 0.0–0.7)
HCT: 37.7 % (ref 36.0–46.0)
Hemoglobin: 13.2 g/dL (ref 12.0–15.0)
Lymphocytes Relative: 39 % (ref 12–46)
Lymphs Abs: 2.9 10*3/uL (ref 0.7–4.0)
MCH: 31.7 pg (ref 26.0–34.0)
MCHC: 35 g/dL (ref 30.0–36.0)
MCV: 90.6 fL (ref 78.0–100.0)
Monocytes Absolute: 0.5 10*3/uL (ref 0.1–1.0)
Monocytes Relative: 7 % (ref 3–12)
Neutro Abs: 3.6 10*3/uL (ref 1.7–7.7)
RBC: 4.16 MIL/uL (ref 3.87–5.11)
WBC: 7.4 10*3/uL (ref 4.0–10.5)

## 2013-08-25 LAB — COMPREHENSIVE METABOLIC PANEL
Albumin: 4.3 g/dL (ref 3.5–5.2)
Alkaline Phosphatase: 77 U/L (ref 39–117)
BUN: 21 mg/dL (ref 6–23)
CO2: 23 mEq/L (ref 19–32)
Glucose, Bld: 85 mg/dL (ref 70–99)
Potassium: 3.8 mEq/L (ref 3.5–5.3)
Total Bilirubin: 0.3 mg/dL (ref 0.3–1.2)
Total Protein: 7.7 g/dL (ref 6.0–8.3)

## 2013-08-27 ENCOUNTER — Encounter: Payer: Self-pay | Admitting: Physician Assistant

## 2013-09-06 ENCOUNTER — Other Ambulatory Visit: Payer: Self-pay | Admitting: Internal Medicine

## 2013-09-06 DIAGNOSIS — C220 Liver cell carcinoma: Secondary | ICD-10-CM

## 2013-09-12 ENCOUNTER — Ambulatory Visit
Admission: RE | Admit: 2013-09-12 | Discharge: 2013-09-12 | Disposition: A | Discharge status: Home / Self Care | Payer: Medicare Other | Source: Ambulatory Visit | Attending: Internal Medicine | Admitting: Internal Medicine

## 2013-09-12 DIAGNOSIS — C220 Liver cell carcinoma: Secondary | ICD-10-CM

## 2013-09-26 ENCOUNTER — Other Ambulatory Visit: Payer: Self-pay | Admitting: Physician Assistant

## 2013-09-26 DIAGNOSIS — Z1231 Encounter for screening mammogram for malignant neoplasm of breast: Secondary | ICD-10-CM

## 2013-10-03 ENCOUNTER — Encounter: Payer: Self-pay | Admitting: Physician Assistant

## 2013-10-03 DIAGNOSIS — R768 Other specified abnormal immunological findings in serum: Secondary | ICD-10-CM

## 2013-10-10 ENCOUNTER — Ambulatory Visit (HOSPITAL_COMMUNITY)
Admission: RE | Admit: 2013-10-10 | Discharge: 2013-10-10 | Disposition: A | Discharge status: Home / Self Care | Payer: Medicare HMO | Source: Ambulatory Visit | Attending: Physician Assistant | Admitting: Physician Assistant

## 2013-10-10 ENCOUNTER — Encounter: Payer: Self-pay | Admitting: Physician Assistant

## 2013-10-10 DIAGNOSIS — R768 Other specified abnormal immunological findings in serum: Secondary | ICD-10-CM

## 2013-10-10 DIAGNOSIS — Z1231 Encounter for screening mammogram for malignant neoplasm of breast: Secondary | ICD-10-CM | POA: Insufficient documentation

## 2014-02-28 ENCOUNTER — Ambulatory Visit (HOSPITAL_COMMUNITY)
Admission: RE | Admit: 2014-02-28 | Discharge: 2014-02-28 | Disposition: A | Discharge status: Home / Self Care | Payer: Medicare HMO | Source: Ambulatory Visit | Attending: Physician Assistant | Admitting: Physician Assistant

## 2014-02-28 DIAGNOSIS — Z13820 Encounter for screening for osteoporosis: Secondary | ICD-10-CM | POA: Insufficient documentation

## 2014-02-28 DIAGNOSIS — Z780 Asymptomatic menopausal state: Secondary | ICD-10-CM | POA: Insufficient documentation

## 2014-02-28 DIAGNOSIS — E559 Vitamin D deficiency, unspecified: Secondary | ICD-10-CM

## 2014-02-28 DIAGNOSIS — M8580 Other specified disorders of bone density and structure, unspecified site: Secondary | ICD-10-CM

## 2014-03-13 ENCOUNTER — Other Ambulatory Visit: Payer: Self-pay | Admitting: Physician Assistant

## 2014-03-13 DIAGNOSIS — M8580 Other specified disorders of bone density and structure, unspecified site: Secondary | ICD-10-CM

## 2014-03-13 MED ORDER — ALENDRONATE SODIUM 70 MG PO TABS: 70.0000 mg | ORAL_TABLET | ORAL | Status: DC

## 2014-03-14 ENCOUNTER — Other Ambulatory Visit: Payer: Self-pay | Admitting: Physician Assistant

## 2014-04-24 ENCOUNTER — Telehealth: Payer: Self-pay

## 2014-04-24 MED ORDER — AMLODIPINE BESYLATE 10 MG PO TABS: 10.0000 mg | ORAL_TABLET | Freq: Every day | ORAL | Status: DC

## 2014-04-24 MED ORDER — LOVASTATIN 20 MG PO TABS: 40.0000 mg | ORAL_TABLET | Freq: Every day | ORAL | Status: DC

## 2014-04-24 NOTE — Telephone Encounter (Signed)
Pt needs approval from [facility] to get a refill of Lovastatin and Norvasc please!

## 2014-04-24 NOTE — Telephone Encounter (Signed)
LM pt needs to schedule an OV. Sent in 30 day supply.

## 2014-05-24 ENCOUNTER — Other Ambulatory Visit: Payer: Self-pay | Admitting: Physician Assistant

## 2014-05-31 ENCOUNTER — Other Ambulatory Visit: Payer: Self-pay | Admitting: Physician Assistant

## 2014-06-10 ENCOUNTER — Ambulatory Visit (INDEPENDENT_AMBULATORY_CARE_PROVIDER_SITE_OTHER): Payer: Medicare HMO | Admitting: Physician Assistant

## 2014-06-10 VITALS — BP 134/76 | HR 65 | Temp 98.0°F | Resp 18 | Ht 61.0 in | Wt 152.0 lb

## 2014-06-10 DIAGNOSIS — M79644 Pain in right finger(s): Secondary | ICD-10-CM

## 2014-06-10 DIAGNOSIS — Z23 Encounter for immunization: Secondary | ICD-10-CM

## 2014-06-10 DIAGNOSIS — E785 Hyperlipidemia, unspecified: Secondary | ICD-10-CM

## 2014-06-10 DIAGNOSIS — I10 Essential (primary) hypertension: Secondary | ICD-10-CM

## 2014-06-10 DIAGNOSIS — M79609 Pain in unspecified limb: Secondary | ICD-10-CM

## 2014-06-10 LAB — POCT CBC
Granulocyte percent: 49.7 %G (ref 37–80)
HEMATOCRIT: 42.2 % (ref 37.7–47.9)
Hemoglobin: 13.7 g/dL (ref 12.2–16.2)
LYMPH, POC: 3 (ref 0.6–3.4)
MCH, POC: 31.9 pg — AB (ref 27–31.2)
MCHC: 32.6 g/dL (ref 31.8–35.4)
MCV: 97.9 fL — AB (ref 80–97)
MID (CBC): 0.6 (ref 0–0.9)
MPV: 7.6 fL (ref 0–99.8)
PLATELET COUNT, POC: 337 10*3/uL (ref 142–424)
POC GRANULOCYTE: 3.6 (ref 2–6.9)
POC LYMPH %: 42.1 % (ref 10–50)
POC MID %: 8.2 % (ref 0–12)
RBC: 4.31 M/uL (ref 4.04–5.48)
RDW, POC: 13.3 %
WBC: 7.2 10*3/uL (ref 4.6–10.2)

## 2014-06-10 MED ORDER — LOVASTATIN 40 MG PO TABS: 40.0000 mg | ORAL_TABLET | Freq: Every day | ORAL | Status: DC

## 2014-06-10 MED ORDER — AMLODIPINE BESYLATE 10 MG PO TABS: 10.0000 mg | ORAL_TABLET | Freq: Every day | ORAL | Status: DC

## 2014-06-10 MED ORDER — MELOXICAM 7.5 MG PO TABS: 7.5000 mg | ORAL_TABLET | Freq: Every day | ORAL | Status: DC

## 2014-06-10 NOTE — Progress Notes (Signed)
Subjective:    Patient ID: Brandi Harrington, female    DOB: [birth year], 68 y.o.   MRN: [id number]   PCP: Oscar Forman, PA-C  Chief Complaint  Patient presents with  . rx refills    norvasc and discuss other meds taking  . Hand Pain    rt middle finger soreness off and on for a while     Medications, allergies, past medical history, surgical history, family history, social history and problem list reviewed and updated.  Patient Active Problem List   Diagnosis Date Noted  . Hepatitis C antibody test positive 02/15/2013  . HTN (hypertension) 05/12/2012  . Hyperlipidemia 05/12/2012  . Osteopenia   . TMJ syndrome   . Vitamin D deficiency     Prior to Admission medications   Medication Sig Start Date End Date Taking? Authorizing Provider  alendronate (FOSAMAX) 70 MG tablet Take 1 tablet (70 mg total) by mouth every 7 (seven) days. Take with a full glass of water on an empty stomach. 03/13/14  Yes Shyloh Derosa S Bernetta Sutley, PA-C  amLODipine (NORVASC) 10 MG tablet Take 1 tablet (10 mg total) by mouth daily. NO MORE REFILLS WITHOUT OFFICE VISIT - 2ND NOTICE 05/24/14  Yes Tiye Huwe S Amillia Biffle, PA-C  CALCIUM PO Take by mouth daily.   Yes Historical Provider, MD  cholecalciferol (VITAMIN D) 1000 UNITS tablet Take 1,000 Units by mouth daily.   Yes Historical Provider, MD  clobetasol cream (TEMOVATE) 0.05 % Apply topically 2 (two) times daily. 05/10/13  Yes Shawndra Clute S Roddrick Sharron, PA-C  fish oil-omega-3 fatty acids 1000 MG capsule Take 2 g by mouth daily.   Yes Historical Provider, MD  lovastatin (MEVACOR) 20 MG tablet Take 2 tablets (40 mg total) by mouth daily. NO MORE REFILLS WITHOUT OFFICE VISIT - 2ND NOTICE 06/01/14  Yes Daelon Dunivan S Wafaa Deemer, PA-C  Multiple Vitamins-Minerals (CENTRUM SILVER PO) Take by mouth.   Yes Historical Provider, MD    HPI  This 68 y.o. female presents for evaluation of HTN and hyperlipidemia.   Overall, she's doing well.  Took her last dose of amlodipine today and is very concerned  that she's out.    Getting divorced. Lots of stress associated with that, dealing with the attorneys and the costs.  RIGHT middle finger pain with use.  Worst at the MCP, but extends the entire finger and into the hand.  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  Review of Systems As above. Denies chest pain, shortness of breath, HA, dizziness, vision change, nausea, vomiting, diarrhea, constipation, melena, hematochezia, dysuria, increased urinary urgency or frequency, increased hunger or thirst, unintentional weight change, other unexplained myalgias or arthralgias, rash.     Objective:   Physical Exam  Vitals reviewed. Constitutional: She is oriented to person, place, and time. Vital signs are normal. She appears well-developed and well-nourished. She is active and cooperative. No distress.  BP 134/76  Pulse 65  Temp(Src) 98 F (36.7 C) (Oral)  Resp 18  Ht 5\' 1"  (1.549 m)  Wt 152 lb (68.947 kg)  BMI 28.74 kg/m2  SpO2 99%  HENT:  Head: Normocephalic and atraumatic.  Right Ear: Hearing normal.  Left Ear: Hearing normal.  Eyes: Conjunctivae are normal. No scleral icterus.  Neck: Normal range of motion. Neck supple. No thyromegaly present.  Cardiovascular: Normal rate, regular rhythm and normal heart sounds.   Pulses:      Radial pulses are 2+ on the right side, and  2+ on the left side.  Pulmonary/Chest: Effort normal and breath sounds normal.  Musculoskeletal:       Right wrist: Normal.       Right hand: She exhibits normal range of motion, no tenderness, no bony tenderness, normal capillary refill, no deformity, no laceration and no swelling. Normal sensation noted. Normal strength noted.       Hands: Lymphadenopathy:       Head (right side): No tonsillar, no preauricular, no posterior auricular and no occipital adenopathy present.       Head (left side): No tonsillar, no preauricular, no posterior auricular and no  occipital adenopathy present.    She has no cervical adenopathy.       Right: No supraclavicular adenopathy present.       Left: No supraclavicular adenopathy present.  Neurological: She is alert and oriented to person, place, and time. No sensory deficit.  Skin: Skin is warm, dry and intact. No rash noted. No cyanosis or erythema. Nails show no clubbing.  Psychiatric: She has a normal mood and affect.          Assessment & Plan:  1. Essential hypertension Controlled. Continue current treatment. - amLODipine (NORVASC) 10 MG tablet; Take 1 tablet (10 mg total) by mouth daily.  Dispense: 30 tablet; Refill: 5 - POCT CBC - Comprehensive metabolic panel - TSH  2. Hyperlipidemia Await labs.  Healthy eating, exercise. - lovastatin (MEVACOR) 40 MG tablet; Take 1 tablet (40 mg total) by mouth daily.  Dispense: 30 tablet; Refill: 5 - Lipid panel  3. Finger pain, right Likely tendonitis.  - meloxicam (MOBIC) 7.5 MG tablet; Take 1 tablet (7.5 mg total) by mouth daily.  Dispense: 30 tablet; Refill: 0  4. Need for influenza vaccination - Flu Vaccine QUAD 36+ mos IM  5. Need for vaccination with 13-polyvalent pneumococcal conjugate vaccine Also needs Pneumovax in 6-12 months. - Pneumococcal conjugate vaccine 13-valent IM   Fernande Bras, PA-C Physician Assistant-Certified [facility]

## 2014-06-10 NOTE — Patient Instructions (Signed)
I will contact you with your lab results as soon as they are available.   If you have not heard from me in 2 weeks, please contact me.  The fastest way to get your results is to register for My Chart (see the instructions on the last page of this printout).   

## 2014-06-11 ENCOUNTER — Encounter: Payer: Self-pay | Admitting: Physician Assistant

## 2014-06-11 LAB — COMPREHENSIVE METABOLIC PANEL
ALT: 22 U/L (ref 0–35)
AST: 31 U/L (ref 0–37)
Albumin: 4.4 g/dL (ref 3.5–5.2)
Alkaline Phosphatase: 75 U/L (ref 39–117)
BILIRUBIN TOTAL: 0.5 mg/dL (ref 0.2–1.2)
BUN: 9 mg/dL (ref 6–23)
CO2: 27 mEq/L (ref 19–32)
Calcium: 9.6 mg/dL (ref 8.4–10.5)
Chloride: 105 mEq/L (ref 96–112)
Creat: 0.9 mg/dL (ref 0.50–1.10)
GLUCOSE: 71 mg/dL (ref 70–99)
Potassium: 4.3 mEq/L (ref 3.5–5.3)
Sodium: 141 mEq/L (ref 135–145)
Total Protein: 7.4 g/dL (ref 6.0–8.3)

## 2014-06-11 LAB — LIPID PANEL
Cholesterol: 190 mg/dL (ref 0–200)
HDL: 81 mg/dL (ref >39)
LDL CALC: 92 mg/dL (ref 0–99)
TRIGLYCERIDES: 86 mg/dL (ref <150)
Total CHOL/HDL Ratio: 2.3 Ratio
VLDL: 17 mg/dL (ref 0–40)

## 2014-06-11 LAB — TSH: TSH: 1.676 u[IU]/mL (ref 0.350–4.500)

## 2014-09-25 ENCOUNTER — Other Ambulatory Visit: Payer: Self-pay | Admitting: Physician Assistant

## 2014-09-25 DIAGNOSIS — Z1231 Encounter for screening mammogram for malignant neoplasm of breast: Secondary | ICD-10-CM

## 2014-10-11 ENCOUNTER — Ambulatory Visit (HOSPITAL_COMMUNITY): Payer: Medicare Other | Attending: Physician Assistant

## 2014-11-05 ENCOUNTER — Ambulatory Visit (HOSPITAL_COMMUNITY): Payer: Medicare Other | Attending: Physician Assistant

## 2014-12-14 ENCOUNTER — Ambulatory Visit (HOSPITAL_COMMUNITY)
Admission: RE | Admit: 2014-12-14 | Discharge: 2014-12-14 | Disposition: A | Discharge status: Home / Self Care | Payer: Medicare HMO | Source: Ambulatory Visit | Attending: Family Medicine | Admitting: Family Medicine

## 2014-12-14 DIAGNOSIS — Z1231 Encounter for screening mammogram for malignant neoplasm of breast: Secondary | ICD-10-CM | POA: Diagnosis present

## 2014-12-18 ENCOUNTER — Telehealth: Payer: Self-pay

## 2014-12-18 NOTE — Telephone Encounter (Signed)
That's a very unusual symptom. She'll need to come in for that to be evaluated.

## 2014-12-18 NOTE — Telephone Encounter (Signed)
Pt wants Chelle to know when she pees one side of her anus hurts. She wants to know what to do about this. Please advise at [phone]

## 2014-12-18 NOTE — Telephone Encounter (Signed)
Does she need to RTC?

## 2014-12-19 NOTE — Telephone Encounter (Signed)
Pt is going to come in to be seen.

## 2014-12-21 ENCOUNTER — Ambulatory Visit (INDEPENDENT_AMBULATORY_CARE_PROVIDER_SITE_OTHER): Payer: Medicare HMO | Admitting: Internal Medicine

## 2014-12-21 VITALS — BP 130/74 | HR 73 | Temp 98.7°F | Resp 18 | Ht 61.5 in | Wt 148.8 lb

## 2014-12-21 DIAGNOSIS — M79644 Pain in right finger(s): Secondary | ICD-10-CM

## 2014-12-21 DIAGNOSIS — K640 First degree hemorrhoids: Secondary | ICD-10-CM

## 2014-12-21 MED ORDER — MELOXICAM 7.5 MG PO TABS: 7.5000 mg | ORAL_TABLET | Freq: Every day | ORAL | Status: DC

## 2014-12-21 NOTE — Progress Notes (Signed)
Patient ID: Brandi Harrington, female    DOB: August 29, 1946, 69 y.o.   MRN: [id number]  PCP: Olene Floss  Subjective:   Chief Complaint  Patient presents with  . Boil    x  4 days     HPI Felt a lump and pain with wiping after using the restroom 4 days ago. The lump was adjacent to the anus. Has been soaking in a tub of warm water, which has seemed to help, and in fact, she reports that the pain is resolved, and the lump is much smaller.  No pain with BM. Stools are soft, no straining. No bleeding or drainage. Colonoscopy normal in 2007. When it started, her whole RIGHT leg felt weak, "like it may give out." But that has resolved.  Review of Systems Review of Systems  Constitutional: Positive for fatigue. Negative for fever and chills.  Gastrointestinal: Negative for nausea, vomiting, abdominal pain, diarrhea, constipation, blood in stool, abdominal distention and anal bleeding. Rectal pain: resolved.       Patient Active Problem List   Diagnosis Date Noted  . Hepatitis C antibody test positive 02/15/2013  . HTN (hypertension) 05/12/2012  . Hyperlipidemia 05/12/2012  . Osteopenia   . TMJ syndrome   . Vitamin D deficiency      Prior to Admission medications   Medication Sig Start Date End Date Taking? Authorizing Provider  alendronate (FOSAMAX) 70 MG tablet Take 1 tablet (70 mg total) by mouth every 7 (seven) days. Take with a full glass of water on an empty stomach. 03/13/14  Yes Teren Zurcher S Oluwatoyin Banales, PA-C  amLODipine (NORVASC) 10 MG tablet Take 1 tablet (10 mg total) by mouth daily. 06/10/14  Yes Saurabh Hettich S Cortez Flippen, PA-C  CALCIUM PO Take by mouth daily.   Yes Historical Provider, MD  cholecalciferol (VITAMIN D) 1000 UNITS tablet Take 1,000 Units by mouth daily.   Yes Historical Provider, MD  clobetasol cream (TEMOVATE) 0.05 % Apply topically 2 (two) times daily. 05/10/13  Yes Jerrianne Hartin S Andrena Margerum, PA-C  fish oil-omega-3 fatty acids 1000 MG capsule Take 2 g by mouth daily.   Yes  Historical Provider, MD  lovastatin (MEVACOR) 40 MG tablet Take 1 tablet (40 mg total) by mouth daily. 06/10/14  Yes Donalyn Schneeberger S Damieon Armendariz, PA-C  meloxicam (MOBIC) 7.5 MG tablet Take 1 tablet (7.5 mg total) by mouth daily. 06/10/14  Yes Hakop Humbarger S Kaaliyah Kita, PA-C  Multiple Vitamins-Minerals (CENTRUM SILVER PO) Take by mouth.   Yes Historical Provider, MD     No Known Allergies     Objective:  Physical Exam  Physical Exam  Constitutional: She is oriented to person, place, and time. She appears well-developed and well-nourished. She is active and cooperative. No distress.  BP 130/74 mmHg  Pulse 73  Temp(Src) 98.7 F (37.1 C) (Oral)  Resp 18  Ht 5' 1.5" (1.562 m)  Wt 148 lb 12.8 oz (67.495 kg)  BMI 27.66 kg/m2  SpO2 96%   Eyes: Conjunctivae are normal.  Pulmonary/Chest: Effort normal.  Genitourinary:    Pelvic exam was performed with patient supine.  Neurological: She is alert and oriented to person, place, and time.  Psychiatric: She has a normal mood and affect. Her speech is normal and behavior is normal.         Assessment & Plan:  1. First degree hemorrhoids Supportive care. Anticipatory guidance. Since the pain is resolved, no treatment needed at this time.   2. Finger pain, right Stable. Needs refill. - meloxicam (MOBIC) 7.5  MG tablet; Take 1 tablet (7.5 mg total) by mouth daily.  Dispense: 90 tablet; Refill: 1  This patient was also examined by Dr. Perrin Maltese.  Fernande Bras, PA-C Physician Assistant-Certified [facility]

## 2014-12-21 NOTE — Patient Instructions (Signed)

## 2014-12-25 ENCOUNTER — Ambulatory Visit: Payer: Medicare HMO | Admitting: Physician Assistant

## 2014-12-27 ENCOUNTER — Other Ambulatory Visit: Payer: Self-pay | Admitting: Physician Assistant

## 2015-01-14 ENCOUNTER — Other Ambulatory Visit: Payer: Self-pay | Admitting: Physician Assistant

## 2015-01-15 NOTE — Telephone Encounter (Signed)
Chelle, [place] to RF until pt's appt 02/26/15?

## 2015-01-18 ENCOUNTER — Ambulatory Visit (INDEPENDENT_AMBULATORY_CARE_PROVIDER_SITE_OTHER): Payer: Medicare HMO | Admitting: Physician Assistant

## 2015-01-18 VITALS — BP 126/80 | HR 74 | Temp 98.2°F | Resp 16 | Ht 60.75 in | Wt 150.0 lb

## 2015-01-18 DIAGNOSIS — Z720 Tobacco use: Secondary | ICD-10-CM | POA: Insufficient documentation

## 2015-01-18 DIAGNOSIS — K130 Diseases of lips: Secondary | ICD-10-CM

## 2015-01-18 DIAGNOSIS — L309 Dermatitis, unspecified: Secondary | ICD-10-CM

## 2015-01-18 DIAGNOSIS — R6889 Other general symptoms and signs: Secondary | ICD-10-CM

## 2015-01-18 MED ORDER — PREDNISONE 20 MG PO TABS: ORAL_TABLET | ORAL | Status: DC

## 2015-01-18 NOTE — Progress Notes (Signed)
Subjective:    Patient ID: Brandi Harrington, female    DOB: 03-23-1946, 69 y.o.   MRN: [id number]  HPI  Brandi Harrington is a 69 year old female who presents today for evaluation of skin rash and tingling lips.   Symptoms began about 2 weeks ago. She was out in her garden, and noticed a rash developed later that day. She denies any contact with weeds or other bushes while she was in the garden. Her garden consists mostly of watermelon, cucumber, and other fruits and vegetables. The rash started as a couple of spots on the back of her neck, and spread down the left side of her neck and onto her chest. There is no other involvement on her skin. The spots are itchy but not painful. She does not scratch the area. Denies any fever, chills, night sweats, or nausea. She has tried Hydrocortisone cream without relief.   Her lips started tingling about 2 weeks ago as well. She normally wears lipstick and did buy a new lipstick, but once she noticed her lips tingling she stopped using the lipstick. Also has numbness. The numbness and tingling still persist even though she discontinued the new lipstick. She has not tried any medications for her lips. The numbness and tingling come and go throughout the day.     She denies changing laundry detergents, body wash, or other skin care products. She has not changed her medications or started taking anything different.    Review of Systems  Constitutional: Negative for fever, chills, diaphoresis and fatigue.  HENT: Positive for congestion.   Respiratory: Negative for cough and shortness of breath.   Cardiovascular: Negative for chest pain.  Gastrointestinal: Negative for nausea and vomiting.  Skin: Positive for color change and rash.  Neurological: Positive for numbness (Numbness and tingling to lips ). Negative for dizziness and headaches.       Objective:   Physical Exam  Constitutional: She is oriented to person, place, and time. She appears well-developed and  well-nourished. No distress.  BP 126/80 mmHg  Pulse 74  Temp(Src) 98.2 F (36.8 C) (Oral)  Resp 16  Ht 5' 0.75" (1.543 m)  Wt 150 lb (68.04 kg)  BMI 28.58 kg/m2  SpO2 93%  HENT:  Head: Normocephalic and atraumatic.  Eyes: Conjunctivae are normal.  Neck: Neck supple.  Cardiovascular: Normal rate, regular rhythm and normal heart sounds.  Exam reveals no gallop and no friction rub.   No murmur heard. Pulmonary/Chest: Effort normal and breath sounds normal. She has no wheezes. She has no rales.  Lymphadenopathy:       Head (right side): No tonsillar adenopathy present.       Head (left side): No tonsillar adenopathy present.    She has no cervical adenopathy.       Right: No supraclavicular adenopathy present.       Left: No supraclavicular adenopathy present.  Neurological: She is alert and oriented to person, place, and time.  Skin: Skin is warm and dry. Rash noted. Rash is urticarial. Rash is not pustular.     Psychiatric: She has a normal mood and affect.      Assessment & Plan:  1. Dermatitis Hydrocortisone cream over-the-counter was unhelpful, gave prescription for oral prednisone. It does not seem that she had any contact with a specific irritant to cause the symptoms. Will re-evaluate if rash does not improve after taking Prednisone.  - predniSONE (DELTASONE) 20 MG tablet; Take 3 PO QAM x3days, 2 PO  QAM x3days, 1 PO QAM x3days  Dispense: 18 tablet; Refill: 0  2. Lip symptom Numbness and tingling. Medication list does not indicate any precipitants that could have caused these sensations. If not improved after taking Prednisone, will re-evaluate.

## 2015-01-18 NOTE — Patient Instructions (Signed)
Take the Prednisone in the mornings, with food to prevent insomnia and upset stomach. Take the Atarax (hydroxyzine) at bedtime, as it causes drowsiness. You may use an over-the-counter antihistamine (Claritin, Zyrtec, Allegra) for daytime symptoms, as well as over-the-counter hydrocortisone cream and/or calamine lotion. Stay as cool and dry as you can, as getting hot and sweaty (or taking a very hot shower) can increase the itching.

## 2015-01-18 NOTE — Progress Notes (Signed)
Patient ID: XYA GENUNG, female    DOB: 01/09/1946, 69 y.o.   MRN: [id number]  PCP: Luticia Tadros, PA-C  Subjective:   Chief Complaint  Patient presents with  . tingling in lips    x 1 week   . Rash    chest x 2 weeks   . Cough    HPI Presents for evaluation of rash and lip tingling.  Symptoms began about 2 weeks ago. She was out in her garden, and noticed a rash developed later that day. She denies any contact with weeds or other bushes while she was in the garden. Her garden consists mostly of watermelon, cucumber, and other fruits and vegetables. The rash started as a couple of spots on the back of her neck, and spread down the left side of her neck and onto her chest. There is no other involvement on her skin. The spots are itchy but not painful. She does not scratch the area. Denies any fever, chills, night sweats, or nausea. She has tried Hydrocortisone cream without relief.    Her lips started tingling about 2 weeks ago as well. She normally wears lipstick and did buy a new lipstick, but once she noticed her lips tingling she stopped using the lipstick. Also has numbness. The numbness and tingling still persist even though she discontinued the new lipstick. She has not tried any medications for her lips. The numbness and tingling come and go throughout the day.   She denies changing laundry detergents, body wash, or other skin care products. She has not changed her medications or started taking anything different.         Review of Systems Constitutional: Negative for fever, chills, diaphoresis and fatigue.  HENT: Positive for congestion.  Respiratory: Negative for cough and shortness of breath.  Cardiovascular: Negative for chest pain.  Gastrointestinal: Negative for nausea and vomiting.  Skin: Positive for color change and rash.  Neurological: Positive for numbness (Numbness and tingling to lips ). Negative for dizziness and headaches..    Patient Active  Problem List   Diagnosis Date Noted  . Tobacco use 01/18/2015  . Hepatitis C antibody test positive 02/15/2013  . HTN (hypertension) 05/12/2012  . Hyperlipidemia 05/12/2012  . Osteopenia   . TMJ syndrome   . Vitamin D deficiency      Prior to Admission medications   Medication Sig Start Date End Date Taking? Authorizing Provider  alendronate (FOSAMAX) 70 MG tablet Take 1 tablet (70 mg total) by mouth every 7 (seven) days. Take with a full glass of water on an empty stomach. 03/13/14  Yes Davied Nocito S Kerem Gilmer, PA-C  amLODipine (NORVASC) 10 MG tablet Take 1 tablet (10 mg total) by mouth daily. PATIENT NEEDS BLOOD PRESSURE CHECK UP FOR ADDITIONAL REFILLS 12/27/14  Yes Kalvin Buss S Harleen Fineberg, PA-C  CALCIUM PO Take by mouth daily.   Yes Historical Provider, MD  cholecalciferol (VITAMIN D) 1000 UNITS tablet Take 1,000 Units by mouth daily.   Yes Historical Provider, MD  clobetasol cream (TEMOVATE) 0.05 % Apply topically 2 (two) times daily. 05/10/13  Yes Tyric Rodeheaver S Aireal Slater, PA-C  fish oil-omega-3 fatty acids 1000 MG capsule Take 2 g by mouth daily.   Yes Historical Provider, MD  lovastatin (MEVACOR) 40 MG tablet TAKE ONE TABLET BY MOUTH ONCE DAILY 01/15/15  Yes Travonte Byard S Toriano Aikey, PA-C  meloxicam (MOBIC) 7.5 MG tablet Take 1 tablet (7.5 mg total) by mouth daily. 12/21/14  Yes Abdalla Naramore Tessa Lerner, PA-C  Multiple Vitamins-Minerals (CENTRUM SILVER  PO) Take by mouth.   Yes Historical Provider, MD  predniSONE (DELTASONE) 20 MG tablet Take 3 PO QAM x3days, 2 PO QAM x3days, 1 PO QAM x3days 01/18/15   Kathey Simer S Kyiah Canepa, PA-C     No Known Allergies     Objective:  Physical Exam  Constitutional: She is oriented to person, place, and time. She appears well-developed and well-nourished. She is active and cooperative. No distress.  BP 126/80 mmHg  Pulse 74  Temp(Src) 98.2 F (36.8 C) (Oral)  Resp 16  Ht 5' 0.75" (1.543 m)  Wt 150 lb (68.04 kg)  BMI 28.58 kg/m2  SpO2 93%   Eyes: Conjunctivae are normal.    Pulmonary/Chest: Effort normal.  Neurological: She is alert and oriented to person, place, and time.  Skin: Rash noted. Rash is papular.     Psychiatric: She has a normal mood and affect. Her speech is normal and behavior is normal.           Assessment & Plan:   1. Dermatitis 2. Lip symptom Undetermined etiology. Treat with oral steroid taper due to lip symptoms. Anticipate complete resolution. - predniSONE (DELTASONE) 20 MG tablet; Take 3 PO QAM x3days, 2 PO QAM x3days, 1 PO QAM x3days  Dispense: 18 tablet; Refill: 0    Fernande Bras, PA-C Physician Assistant-Certified [facility] .

## 2015-01-22 ENCOUNTER — Encounter (HOSPITAL_COMMUNITY): Payer: Self-pay | Admitting: Emergency Medicine

## 2015-01-22 ENCOUNTER — Emergency Department (HOSPITAL_COMMUNITY)
Admission: EM | Admit: 2015-01-22 | Discharge: 2015-01-22 | Disposition: A | Discharge status: Home / Self Care | Payer: Medicare HMO | Attending: Emergency Medicine | Admitting: Emergency Medicine

## 2015-01-22 DIAGNOSIS — E785 Hyperlipidemia, unspecified: Secondary | ICD-10-CM | POA: Insufficient documentation

## 2015-01-22 DIAGNOSIS — K130 Diseases of lips: Secondary | ICD-10-CM

## 2015-01-22 DIAGNOSIS — Z79899 Other long term (current) drug therapy: Secondary | ICD-10-CM | POA: Insufficient documentation

## 2015-01-22 DIAGNOSIS — I10 Essential (primary) hypertension: Secondary | ICD-10-CM | POA: Diagnosis not present

## 2015-01-22 DIAGNOSIS — Z720 Tobacco use: Secondary | ICD-10-CM | POA: Insufficient documentation

## 2015-01-22 DIAGNOSIS — R21 Rash and other nonspecific skin eruption: Secondary | ICD-10-CM | POA: Insufficient documentation

## 2015-01-22 DIAGNOSIS — Z8739 Personal history of other diseases of the musculoskeletal system and connective tissue: Secondary | ICD-10-CM | POA: Insufficient documentation

## 2015-01-22 DIAGNOSIS — R6889 Other general symptoms and signs: Secondary | ICD-10-CM

## 2015-01-22 DIAGNOSIS — E559 Vitamin D deficiency, unspecified: Secondary | ICD-10-CM | POA: Insufficient documentation

## 2015-01-22 DIAGNOSIS — R202 Paresthesia of skin: Secondary | ICD-10-CM | POA: Insufficient documentation

## 2015-01-22 LAB — BASIC METABOLIC PANEL
Anion gap: 11 (ref 5–15)
BUN: 19 mg/dL (ref 6–20)
CHLORIDE: 106 mmol/L (ref 101–111)
CO2: 21 mmol/L — ABNORMAL LOW (ref 22–32)
Calcium: 9.5 mg/dL (ref 8.9–10.3)
Creatinine, Ser: 0.95 mg/dL (ref 0.44–1.00)
GFR calc Af Amer: >60 mL/min (ref >60)
GFR calc non Af Amer: >60 mL/min (ref >60)
GLUCOSE: 129 mg/dL — AB (ref 70–99)
POTASSIUM: 3.8 mmol/L (ref 3.5–5.1)
SODIUM: 138 mmol/L (ref 135–145)

## 2015-01-22 LAB — CBC
HCT: 38.5 % (ref 36.0–46.0)
HEMOGLOBIN: 13.5 g/dL (ref 12.0–15.0)
MCH: 32.5 pg (ref 26.0–34.0)
MCHC: 35.1 g/dL (ref 30.0–36.0)
MCV: 92.8 fL (ref 78.0–100.0)
Platelets: 322 10*3/uL (ref 150–400)
RBC: 4.15 MIL/uL (ref 3.87–5.11)
RDW: 13.6 % (ref 11.5–15.5)
WBC: 8 10*3/uL (ref 4.0–10.5)

## 2015-01-22 NOTE — ED Notes (Signed)
Patient states came in today for her pressure being high at home.  Patient states she took at home and it was 159/98 this morning.   Patient states she also wants to check on a fine rash that she has had on her neck and chest x 3 wks.   Patient states she has burning and tingling to lips and up her face some times.   Patient saw her primary doctor on Friday for same and was prescribed prednisone.   Patient states is not working at this time.   Patient denies any neurological problems.

## 2015-01-22 NOTE — ED Notes (Signed)
PA at bedside.

## 2015-01-22 NOTE — Discharge Instructions (Signed)
Continue your normal home medications and prednisone. Follow up with your regular doctor once the prednisone finishes. Return to the ER for changes or worsening symptoms.   Contact Dermatitis Contact dermatitis is a reaction to certain substances that touch the skin. Contact dermatitis can be either irritant contact dermatitis or allergic contact dermatitis. Irritant contact dermatitis does not require previous exposure to the substance for a reaction to occur.Allergic contact dermatitis only occurs if you have been exposed to the substance before. Upon a repeat exposure, your body reacts to the substance.  CAUSES  Many substances can cause contact dermatitis. Irritant dermatitis is most commonly caused by repeated exposure to mildly irritating substances, such as:  Makeup.  Soaps.  Detergents.  Bleaches.  Acids.  Metal salts, such as nickel. Allergic contact dermatitis is most commonly caused by exposure to:  Poisonous plants.  Chemicals (deodorants, shampoos).  Jewelry.  Latex.  Neomycin in triple antibiotic cream.  Preservatives in products, including clothing. SYMPTOMS  The area of skin that is exposed may develop:  Dryness or flaking.  Redness.  Cracks.  Itching.  Pain or a burning sensation.  Blisters. With allergic contact dermatitis, there may also be swelling in areas such as the eyelids, mouth, or genitals.  DIAGNOSIS  Your caregiver can usually tell what the problem is by doing a physical exam. In cases where the cause is uncertain and an allergic contact dermatitis is suspected, a patch skin test may be performed to help determine the cause of your dermatitis. TREATMENT Treatment includes protecting the skin from further contact with the irritating substance by avoiding that substance if possible. Barrier creams, powders, and gloves may be helpful. Your caregiver may also recommend:  Steroid creams or ointments applied 2 times daily. For best results,  soak the rash area in cool water for 20 minutes. Then apply the medicine. Cover the area with a plastic wrap. You can store the steroid cream in the refrigerator for a "chilly" effect on your rash. That may decrease itching. Oral steroid medicines may be needed in more severe cases.  Antibiotics or antibacterial ointments if a skin infection is present.  Antihistamine lotion or an antihistamine taken by mouth to ease itching.  Lubricants to keep moisture in your skin.  Burow's solution to reduce redness and soreness or to dry a weeping rash. Mix one packet or tablet of solution in 2 cups cool water. Dip a clean washcloth in the mixture, wring it out a bit, and put it on the affected area. Leave the cloth in place for 30 minutes. Do this as often as possible throughout the day.  Taking several cornstarch or baking soda baths daily if the area is too large to cover with a washcloth. Harsh chemicals, such as alkalis or acids, can cause skin damage that is like a burn. You should flush your skin for 15 to 20 minutes with cold water after such an exposure. You should also seek immediate medical care after exposure. Bandages (dressings), antibiotics, and pain medicine may be needed for severely irritated skin.  HOME CARE INSTRUCTIONS  Avoid the substance that caused your reaction.  Keep the area of skin that is affected away from hot water, soap, sunlight, chemicals, acidic substances, or anything else that would irritate your skin.  Do not scratch the rash. Scratching may cause the rash to become infected.  You may take cool baths to help stop the itching.  Only take over-the-counter or prescription medicines as directed by your caregiver.  See  your caregiver for follow-up care as directed to make sure your skin is healing properly. SEEK MEDICAL CARE IF:   Your condition is not better after 3 days of treatment.  You seem to be getting worse.  You see signs of infection such as swelling,  tenderness, redness, soreness, or warmth in the affected area.  You have any problems related to your medicines. Document Released: 09/04/2000 Document Revised: 11/30/2011 Document Reviewed: 02/10/2011 [facility] Patient Information 2015 [place], [place]. This information is not intended to replace advice given to you by your health care provider. Make sure you discuss any questions you have with your health care provider.  Paresthesia Paresthesia is an abnormal burning or prickling sensation. This sensation is generally felt in the hands, arms, legs, or feet. However, it may occur in any part of the body. It is usually not painful. The feeling may be described as:  Tingling or numbness.  "Pins and needles."  Skin crawling.  Buzzing.  Limbs "falling asleep."  Itching. Most people experience temporary (transient) paresthesia at some time in their lives. CAUSES  Paresthesia may occur when you breathe too quickly (hyperventilation). It can also occur without any apparent cause. Commonly, paresthesia occurs when pressure is placed on a nerve. The feeling quickly goes away once the pressure is removed. For some people, however, paresthesia is a long-lasting (chronic) condition caused by an underlying disorder. The underlying disorder may be:  A traumatic, direct injury to nerves. Examples include a:  Broken (fractured) neck.  Fractured skull.  A disorder affecting the brain and spinal cord (central nervous system). Examples include:  Transverse myelitis.  Encephalitis.  Transient ischemic attack.  Multiple sclerosis.  Stroke.  Tumor or blood vessel problems, such as an arteriovenous malformation pressing against the brain or spinal cord.  A condition that damages the peripheral nerves (peripheral neuropathy). Peripheral nerves are not part of the brain and spinal cord. These conditions include:  Diabetes.  Peripheral vascular disease.  Nerve entrapment syndromes, such as  carpal tunnel syndrome.  Shingles.  Hypothyroidism.  Vitamin B12 deficiencies.  Alcoholism.  Heavy metal poisoning (lead, arsenic).  Rheumatoid arthritis.  Systemic lupus erythematosus. DIAGNOSIS  Your caregiver will attempt to find the underlying cause of your paresthesia. Your caregiver may:  Take your medical history.  Perform a physical exam.  Order various lab tests.  Order imaging tests. TREATMENT  Treatment for paresthesia depends on the underlying cause. HOME CARE INSTRUCTIONS  Avoid drinking alcohol.  You may consider massage or acupuncture to help relieve your symptoms.  Keep all follow-up appointments as directed by your caregiver. SEEK IMMEDIATE MEDICAL CARE IF:   You feel weak.  You have trouble walking or moving.  You have problems with speech or vision.  You feel confused.  You cannot control your bladder or bowel movements.  You feel numbness after an injury.  You faint.  Your burning or prickling feeling gets worse when walking.  You have pain, cramps, or dizziness.  You develop a rash. MAKE SURE YOU:  Understand these instructions.  Will watch your condition.  Will get help right away if you are not doing well or get worse. Document Released: 08/28/2002 Document Revised: 11/30/2011 Document Reviewed: 05/29/2011 [facility] Patient Information 2015 [place], [place]. This information is not intended to replace advice given to you by your health care provider. Make sure you discuss any questions you have with your health care provider.

## 2015-01-22 NOTE — ED Provider Notes (Signed)
CSN: [id number]     Arrival date & time 01/22/15  1228 History   First MD Initiated Contact with Patient 01/22/15 1507     Chief Complaint  Patient presents with  . Hypertension  . Rash  . facial tingling      (Consider location/radiation/quality/duration/timing/severity/associated sxs/prior Treatment) HPI Comments: Brandi Harrington is a 69 y.o. female with a PMHx of HTN, HLD, osteopenia, and vitamin D deficiency, who presents to the ED with complaints of 3 weeks of lip tingling which she states is both upper and lower and to both sides of her lips. She reports that this intermittent, and came on with a rash to her neck after working in her garden, which has improved after taking prednisone (saw her PCP Friday who is treating her for contact dermatitis). She initially thought that a bit home had called her symptoms, but she discontinued using the with mom and has not had improvement of her intermittently tingling. She states she notices it worse when she eats or drinks anything and when her blood pressure is high. She reports that this morning she noticed that her blood pressure was high, stating that it was 159/80. She took her amlodipine this morning. She denies any numbness of her lips or extremities, weakness, extremity tingling, hearing changes, tinnitus, dizziness, headache, vision changes, fevers, facial rash, chest pain, shortness breath, wheezing, URI symptoms, abdominal pain, nausea, vomiting, dysuria, hematuria, or any other symptoms at this time. Denies other changes in detergents or soaps, no new meds, no new perfumes or creams.  Patient is a 69 y.o. female presenting with hypertension and rash. The history is provided by the patient. No language interpreter was used.  Hypertension This is a chronic problem. The current episode started today. The problem occurs constantly. The problem has been unchanged. Associated symptoms include a rash (improving). Pertinent negatives include no abdominal  pain, arthralgias, chest pain, chills, fever, headaches, myalgias, nausea, numbness, sore throat, vertigo, visual change, vomiting or weakness. Nothing aggravates the symptoms. She has tried nothing for the symptoms. The treatment provided no relief.  Rash Associated symptoms: no abdominal pain, no diarrhea, no fever, no headaches, no joint pain, no myalgias, no nausea, no shortness of breath, no sore throat, not vomiting and not wheezing     Past Medical History  Diagnosis Date  . Hypertension   . Hyperlipidemia   . Osteopenia   . TMJ syndrome   . Vitamin D deficiency    Past Surgical History  Procedure Laterality Date  . Tubal ligation     Family History  Problem Relation Age of Onset  . Diabetes Sister   . Scoliosis Sister   . Arthritis Sister   . Alcohol abuse Daughter   . Heart disease Son     dysrrhythmia   History  Substance Use Topics  . Smoking status: Current Some Day Smoker -- 0.30 packs/day    Types: Cigarettes  . Smokeless tobacco: Never Used     Comment: smokes a few cigarettes after work when she drinks a beer  . Alcohol Use: 0.0 - 1.5 oz/week    0-3 Standard drinks or equivalent per week     Comment: socially   OB History    No data available     Review of Systems  Constitutional: Negative for fever and chills.  HENT: Negative for sore throat.   Eyes: Negative for photophobia, pain, discharge and visual disturbance.  Respiratory: Negative for shortness of breath and wheezing.   Cardiovascular: Negative  for chest pain.  Gastrointestinal: Negative for nausea, vomiting, abdominal pain, diarrhea and constipation.  Genitourinary: Negative for dysuria, hematuria, vaginal bleeding and vaginal discharge.  Musculoskeletal: Negative for myalgias and arthralgias.  Skin: Positive for rash (improving). Negative for color change.  Neurological: Negative for dizziness, vertigo, weakness, light-headedness, numbness and headaches.       Lip tingling   Psychiatric/Behavioral: Negative for confusion.   10 Systems reviewed and are negative for acute change except as noted in the HPI.    Allergies  Review of patient's allergies indicates no known allergies.  Home Medications   Prior to Admission medications   Medication Sig Start Date End Date Taking? Authorizing Provider  alendronate (FOSAMAX) 70 MG tablet Take 1 tablet (70 mg total) by mouth every 7 (seven) days. Take with a full glass of water on an empty stomach. 03/13/14  Yes Chelle S Jeffery, PA-C  amLODipine (NORVASC) 10 MG tablet Take 1 tablet (10 mg total) by mouth daily. PATIENT NEEDS BLOOD PRESSURE CHECK UP FOR ADDITIONAL REFILLS 12/27/14  Yes Chelle S Jeffery, PA-C  CALCIUM PO Take by mouth daily.   Yes Historical Provider, MD  cholecalciferol (VITAMIN D) 1000 UNITS tablet Take 1,000 Units by mouth daily.   Yes Historical Provider, MD  clobetasol cream (TEMOVATE) 0.05 % Apply topically 2 (two) times daily. 05/10/13  Yes Chelle S Jeffery, PA-C  fish oil-omega-3 fatty acids 1000 MG capsule Take 2 g by mouth daily.   Yes Historical Provider, MD  lovastatin (MEVACOR) 40 MG tablet TAKE ONE TABLET BY MOUTH ONCE DAILY 01/15/15  Yes Chelle S Jeffery, PA-C  Multiple Vitamins-Minerals (CENTRUM SILVER PO) Take by mouth.   Yes Historical Provider, MD  predniSONE (DELTASONE) 20 MG tablet Take 3 PO QAM x3days, 2 PO QAM x3days, 1 PO QAM x3days 01/18/15  Yes Chelle S Jeffery, PA-C   BP 159/81 mmHg  Pulse 53  Temp(Src) 98.2 F (36.8 C) (Oral)  Resp 21  Ht 5\' 1"  (1.549 m)  Wt 149 lb (67.586 kg)  BMI 28.17 kg/m2  SpO2 100% Physical Exam  Constitutional: She is oriented to person, place, and time. Vital signs are normal. She appears well-developed and well-nourished.  Non-toxic appearance. No distress.  Afebrile, nontoxic, NAD  HENT:  Head: Normocephalic and atraumatic.  Nose: Nose normal.  Mouth/Throat: Oropharynx is clear and moist and mucous membranes are normal.  No facial asymmetry,  symmetric tongue protrusion, no facial rashes or lesions, sensation grossly intact in all aspects of the face  Eyes: Conjunctivae and EOM are normal. Right eye exhibits no discharge. Left eye exhibits no discharge.  Neck: Normal range of motion. Neck supple.  Cardiovascular: Normal rate, regular rhythm, normal heart sounds and intact distal pulses.  Exam reveals no gallop and no friction rub.   No murmur heard. Pulmonary/Chest: Effort normal and breath sounds normal. No respiratory distress. She has no decreased breath sounds. She has no wheezes. She has no rhonchi. She has no rales.  Abdominal: Soft. Normal appearance and bowel sounds are normal. She exhibits no distension. There is no tenderness. There is no rigidity, no rebound, no guarding, no CVA tenderness, no tenderness at McBurney's point and negative Murphy's sign.  Musculoskeletal: Normal range of motion.  Neurological: She is alert and oriented to person, place, and time. She has normal strength. No cranial nerve deficit or sensory deficit. Coordination and gait normal. GCS eye subscore is 4. GCS verbal subscore is 5. GCS motor subscore is 6.  CN 2-12 grossly intact A&O  x4 GCS 15 Sensation and strength intact Gait nonataxic Coordination with finger-to-nose WNL  Skin: Skin is warm, dry and intact. No rash noted.  No visible rashes  Psychiatric: She has a normal mood and affect.  Nursing note and vitals reviewed.   ED Course  Procedures (including critical care time) Labs Review Labs Reviewed  BASIC METABOLIC PANEL - Abnormal; Notable for the following:    CO2 21 (*)    Glucose, Bld 129 (*)    All other components within normal limits  CBC    Imaging Review No results found.   EKG Interpretation None      MDM   Final diagnoses:  Tingling  Lip symptom  HTN (hypertension), benign    69 y.o. female here for b/l lip tingling intermittent x3wks. Came on with a rash to her neck, being treated for contact dermatitis  and this is improving. Nonfocal neuro exam. BP stable. EKG nonischemic. Labs unremarkable. DDx includes vitamin deficiency vs dermatitis. Could be MS although late presentation. Doubt brain tumor given that it's bilateral. Will have her continue with prednisone and f/up with PCP. Doubt need for urgent imaging or further work up at this time. Doubt need for urgent neuro consult or referral. I explained the diagnosis and have given explicit precautions to return to the ER including for any other new or worsening symptoms. The patient understands and accepts the medical plan as it's been dictated and I have answered their questions. Discharge instructions concerning home care and prescriptions have been given. The patient is STABLE and is discharged to home in good condition.  BP 161/71 mmHg  Pulse 56  Temp(Src) 98.2 F (36.8 C) (Oral)  Resp 20  Ht 5\' 1"  (1.549 m)  Wt 149 lb (67.586 kg)  BMI 28.17 kg/m2  SpO2 100%     Sascha Baugher Camprubi-Soms, PA-C 01/22/15 [place], MD 01/22/15 1723

## 2015-02-04 ENCOUNTER — Other Ambulatory Visit: Payer: Self-pay | Admitting: Physician Assistant

## 2015-02-05 ENCOUNTER — Other Ambulatory Visit: Payer: Self-pay | Admitting: Physician Assistant

## 2015-02-26 ENCOUNTER — Ambulatory Visit (INDEPENDENT_AMBULATORY_CARE_PROVIDER_SITE_OTHER): Payer: Medicare HMO | Admitting: Physician Assistant

## 2015-02-26 ENCOUNTER — Encounter: Payer: Self-pay | Admitting: Physician Assistant

## 2015-02-26 VITALS — BP 147/85 | HR 69 | Temp 98.2°F | Resp 16 | Ht 60.5 in | Wt 151.0 lb

## 2015-02-26 DIAGNOSIS — Z139 Encounter for screening, unspecified: Secondary | ICD-10-CM

## 2015-02-26 DIAGNOSIS — Z1211 Encounter for screening for malignant neoplasm of colon: Secondary | ICD-10-CM | POA: Diagnosis not present

## 2015-02-26 DIAGNOSIS — D171 Benign lipomatous neoplasm of skin and subcutaneous tissue of trunk: Secondary | ICD-10-CM

## 2015-02-26 DIAGNOSIS — I10 Essential (primary) hypertension: Secondary | ICD-10-CM

## 2015-02-26 DIAGNOSIS — E785 Hyperlipidemia, unspecified: Secondary | ICD-10-CM

## 2015-02-26 DIAGNOSIS — Z23 Encounter for immunization: Secondary | ICD-10-CM | POA: Diagnosis not present

## 2015-02-26 DIAGNOSIS — R209 Unspecified disturbances of skin sensation: Secondary | ICD-10-CM | POA: Diagnosis not present

## 2015-02-26 DIAGNOSIS — Z114 Encounter for screening for human immunodeficiency virus [HIV]: Secondary | ICD-10-CM

## 2015-02-26 DIAGNOSIS — E559 Vitamin D deficiency, unspecified: Secondary | ICD-10-CM

## 2015-02-26 DIAGNOSIS — R202 Paresthesia of skin: Secondary | ICD-10-CM

## 2015-02-26 DIAGNOSIS — M8580 Other specified disorders of bone density and structure, unspecified site: Secondary | ICD-10-CM | POA: Diagnosis not present

## 2015-02-26 DIAGNOSIS — Z0000 Encounter for general adult medical examination without abnormal findings: Secondary | ICD-10-CM

## 2015-02-26 MED ORDER — LOVASTATIN 40 MG PO TABS: 40.0000 mg | ORAL_TABLET | Freq: Every day | ORAL | Status: DC

## 2015-02-26 MED ORDER — AMLODIPINE BESYLATE 10 MG PO TABS: 10.0000 mg | ORAL_TABLET | Freq: Every day | ORAL | Status: DC

## 2015-02-26 NOTE — Patient Instructions (Addendum)
I will contact you with your lab results as soon as they are available.   If you have not heard from me in 2 weeks, please contact me.  The fastest way to get your results is to register for My Chart (see the instructions on the last page of this printout).  Keeping You Healthy  Get These Tests  Blood Pressure- Have your blood pressure checked by your healthcare provider at least once a year.  Normal blood pressure is 120/80.  Weight- Have your body mass index (BMI) calculated to screen for obesity.  BMI is a measure of body fat based on height and weight.  You can calculate your own BMI at www.nhlbisupport.com/bmi/  Cholesterol- Have your cholesterol checked every year.  Diabetes- Have your blood sugar checked every year if you have high blood pressure, high cholesterol, a family history of diabetes or if you are overweight.  Pap Test - Have a pap test every 1 to 5 years if you have been sexually active.  If you are older than 65 and recent pap tests have been normal you may not need additional pap tests.  In addition, if you have had a hysterectomy  for benign disease additional pap tests are not necessary.  Mammogram-Yearly mammograms are essential for early detection of breast cancer  Screening for Colon Cancer- Colonoscopy starting at age 50. Screening may begin sooner depending on your family history and other health conditions.  Follow up colonoscopy as directed by your Gastroenterologist.  Screening for Osteoporosis- Screening begins at age 65 with bone density scanning, sooner if you are at higher risk for developing Osteoporosis.  Get these medicines  Calcium with Vitamin D- Your body requires 1200-1500 mg of Calcium a day and 800-1000 IU of Vitamin D a day.  You can only absorb 500 mg of Calcium at a time therefore Calcium must be taken in 2 or 3 separate doses throughout the day.  Hormones- Hormone therapy has been associated with increased risk for certain cancers and heart  disease.  Talk to your healthcare provider about if you need relief from menopausal symptoms.  Aspirin- Ask your healthcare provider about taking Aspirin to prevent Heart Disease and Stroke.  Get these Immuniztions  Flu shot- Every fall  Pneumonia shot- Once after the age of 65; if you are younger ask your healthcare provider if you need a pneumonia shot.  Tetanus- Every ten years.  Zostavax- Once after the age of 60 to prevent shingles.  Take these steps  Don't smoke- Your healthcare provider can help you quit. For tips on how to quit, ask your healthcare provider or go to www.smokefree.gov or call 1-800 QUIT-NOW.  Be physically active- Exercise 5 days a week for a minimum of 30 minutes.  If you are not already physically active, start slow and gradually work up to 30 minutes of moderate physical activity.  Try walking, dancing, bike riding, swimming, etc.  Eat a healthy diet- Eat a variety of healthy foods such as fruits, vegetables, whole grains, low fat milk, low fat cheeses, yogurt, lean meats, chicken, fish, eggs, dried beans, tofu, etc.  For more information go to www.thenutritionsource.org  Dental visit- Brush and floss teeth twice daily; visit your dentist twice a year.  Eye exam- Visit your Optometrist or Ophthalmologist yearly.  Drink alcohol in moderation- Limit alcohol intake to one drink or less a day.  Never drink and drive.  Depression- Your emotional health is as important as your physical health.  If you're   feeling down or losing interest in things you normally enjoy, please talk to your healthcare provider.  Seat Belts- can save your life; always wear one  Smoke/Carbon Monoxide detectors- These detectors need to be installed on the appropriate level of your home.  Replace batteries at least once a year.  Violence- If anyone is threatening or hurting you, please tell your healthcare provider.  Living Will/ Health care power of attorney- Discuss with your  healthcare provider and family.      Did you know that you begin to benefit from quitting smoking within the first twenty minutes? It's TRUE.  At 20 minutes: -blood pressure decreases -pulse rate drops -body temperature of hands and feet increases  At 8 hours: -carbon monoxide level in blood drops to normal -oxygen level in blood increases to normal  At 24 hours: -the chance of heart attack decreases  At 48 hours: -nerve endings start regrowing -ability to smell and taste is enhanced  2 weeks-3 months: -circulation improves -walking becomes easier -lung function improves  1-9 months: -coughing, sinus congestion, fatigue and shortness of breath decreases  1 year: -excess risk of heart disease is decreased to HALF that of a smoker  5 years: Stroke risk is reduced to that of people who have never smoked  10 years: -risk of lung cancer drops to as little as half that of continuing smokers -risk of cancer of the mouth, throat, esophagus, bladder, kidney and pancreas decreases -risk of ulcer decreases  15 years -risk of heart disease is now similar to that of people who have never smoked -risk of death returns to nearly the level of people who have never smoked  

## 2015-02-26 NOTE — Progress Notes (Signed)
Subjective:    Patient ID: Brandi Harrington, female    DOB: 1946-01-14, 69 y.o.   MRN: [id number]  PCP: Quantel Mcinturff, PA-C  Chief Complaint  Patient presents with  . Annual Exam    HPI  Presents for [employer] Visit.  Mammogram 11/2014 was normal. Not sexually active in years. Pap 2011, no history of abnormality. Reports feeling stressed out about all the papers she's having to fill out this morning.  Tingling in her lips persists, but now intermittently. I treated her for possible allergic response/contact dermatitis with a prednisone taper, and then she went to the ED when she didn't improve. No acute cause was found and deficiency anemia and MS were considered. She was advised to return to see me. CBC x 2 reveals that she is not anemic.  Review of Systems  Constitutional: Negative.   HENT: Negative.        Tingling in lips  Eyes: Negative.   Respiratory: Negative.   Cardiovascular: Negative.   Gastrointestinal: Negative.   Endocrine: Negative.   Genitourinary: Negative.   Musculoskeletal: Positive for myalgias. Negative for back pain, joint swelling, gait problem, neck pain and neck stiffness.  Skin: Negative.   Allergic/Immunologic: Negative.   Neurological: Positive for numbness (tingling in lips, intermittently). Negative for dizziness, tremors, seizures, syncope, facial asymmetry, speech difficulty, weakness, light-headedness and headaches.  Hematological: Negative.   Psychiatric/Behavioral: Negative.    Depression screen [facility] 2/9 02/26/2015 01/18/2015  Decreased Interest 0 0  Down, Depressed, Hopeless 0 0  PHQ - 2 Score 0 0        Objective:   Physical Exam  Constitutional: She is oriented to person, place, and time. Vital signs are normal. She appears well-developed and well-nourished. She is active and cooperative. No distress.  BP 147/85 mmHg  Pulse 69  Temp(Src) 98.2 F (36.8 C) (Oral)  Resp 16  Ht 5' 0.5" (1.537 m)  Wt 151 lb (68.493 kg)  BMI 28.99  kg/m2  SpO2 97%   HENT:  Head: Normocephalic and atraumatic.  Right Ear: Hearing, tympanic membrane, external ear and ear canal normal. No foreign bodies.  Left Ear: Hearing, tympanic membrane, external ear and ear canal normal. No foreign bodies.  Nose: Nose normal.  Mouth/Throat: Uvula is midline, oropharynx is clear and moist and mucous membranes are normal. No oral lesions. Normal dentition. No dental abscesses or uvula swelling. No oropharyngeal exudate.  Eyes: Conjunctivae, EOM and lids are normal. Pupils are equal, round, and reactive to light. Right eye exhibits no discharge. Left eye exhibits no discharge. No scleral icterus.  Fundoscopic exam:      The right eye shows no arteriolar narrowing, no AV nicking, no exudate, no hemorrhage and no papilledema. The right eye shows red reflex.       The left eye shows no arteriolar narrowing, no AV nicking, no exudate, no hemorrhage and no papilledema. The left eye shows red reflex.  Neck: Trachea normal, normal range of motion and full passive range of motion without pain. Neck supple. No spinous process tenderness and no muscular tenderness present. No thyroid mass and no thyromegaly present.  Cardiovascular: Normal rate, regular rhythm, normal heart sounds, intact distal pulses and normal pulses.   Pulmonary/Chest: Effort normal and breath sounds normal. Right breast exhibits no inverted nipple, no mass, no nipple discharge, no skin change and no tenderness. Left breast exhibits no inverted nipple, no mass, no nipple discharge, no skin change and no tenderness. Breasts are symmetrical.  Musculoskeletal: She  exhibits no edema or tenderness.       Cervical back: Normal.       Thoracic back: Normal.       Lumbar back: Normal.       Arms: Lymphadenopathy:       Head (right side): No tonsillar, no preauricular, no posterior auricular and no occipital adenopathy present.       Head (left side): No tonsillar, no preauricular, no posterior  auricular and no occipital adenopathy present.    She has no cervical adenopathy.       Right: No supraclavicular adenopathy present.       Left: No supraclavicular adenopathy present.  Neurological: She is alert and oriented to person, place, and time. She has normal strength and normal reflexes. No cranial nerve deficit. She exhibits normal muscle tone. Coordination and gait normal.  Skin: Skin is warm, dry and intact. No rash noted. She is not diaphoretic. No cyanosis or erythema. Nails show no clubbing.  Psychiatric: She has a normal mood and affect. Her speech is normal and behavior is normal. Judgment and thought content normal.       Visual Acuity Screening   Right eye Left eye Both eyes  Without correction:     With correction: 20/25 20/25 20/20        Assessment & Plan:  1. Medicare annual wellness visit, subsequent Age appropriate anticipatory guidance provided.  2. Facial paresthesia She'll add an OTC oral antihistamine (I recommend fexofenadine). If the symptoms resolve, she can cancel the neurology evaluation. - RPR - Vitamin B12 - Ambulatory referral to Neurology  3. Essential hypertension Above goal today, but usually controlled, so suspect this is the stress of having to complete lots of papers. If BP remains elevated at the next visit, plan to adjust her medication regimen. - Comprehensive metabolic panel - TSH  4. Hyperlipidemia Await lab results. Continue Mevacor. Adjust regimen if indicated. - Lipid panel  5. Vitamin D deficiency Await lab results. Continue OTC supplementation. - Vit D  25 hydroxy (rtn osteoporosis monitoring)  6. Osteopenia See above. Repeat DEXA in 2017.  7. Screening for HIV (human immunodeficiency virus) - HIV antibody (with reflex)  8. Screening for colon cancer Home hemoccult testing. If positive, refer to GI, otherwise repeat colonoscopy in 2017.  9. Lipoma of back Stable. Not bothering her. No treatment indicated at this  time.  10. Need for vaccination against hepatitis B virus RTC in 1-2 months for dose #2 (can coincide with seasonal flu vaccine in August), dose #3 in 6 months at her follow-up. Will need Prevnar-13 then as well. - Hepatitis B vaccine adult IM  11. Screening for condition See above. - POC Hemoccult Bld/Stl (3-Cd Home Screen); Future   Fernande Bras, PA-C Physician Assistant-Certified [facility]

## 2015-02-27 LAB — COMPREHENSIVE METABOLIC PANEL
ALBUMIN: 4.5 g/dL (ref 3.5–5.2)
ALT: 20 U/L (ref 0–35)
AST: 27 U/L (ref 0–37)
Alkaline Phosphatase: 70 U/L (ref 39–117)
BILIRUBIN TOTAL: 0.5 mg/dL (ref 0.2–1.2)
BUN: 18 mg/dL (ref 6–23)
CALCIUM: 9.6 mg/dL (ref 8.4–10.5)
CHLORIDE: 107 meq/L (ref 96–112)
CO2: 25 mEq/L (ref 19–32)
Creat: 1.02 mg/dL (ref 0.50–1.10)
Glucose, Bld: 91 mg/dL (ref 70–99)
Potassium: 4.3 mEq/L (ref 3.5–5.3)
Sodium: 143 mEq/L (ref 135–145)
Total Protein: 7.2 g/dL (ref 6.0–8.3)

## 2015-02-27 LAB — VITAMIN D 25 HYDROXY (VIT D DEFICIENCY, FRACTURES): Vit D, 25-Hydroxy: 31 ng/mL (ref 30–100)

## 2015-02-27 LAB — TSH: TSH: 1.858 u[IU]/mL (ref 0.350–4.500)

## 2015-02-27 LAB — HIV ANTIBODY (ROUTINE TESTING W REFLEX): HIV: NONREACTIVE

## 2015-02-27 LAB — RPR

## 2015-02-27 LAB — LIPID PANEL
CHOLESTEROL: 226 mg/dL — AB (ref 0–200)
HDL: 92 mg/dL (ref >=46)
LDL Cholesterol: 112 mg/dL — ABNORMAL HIGH (ref 0–99)
TRIGLYCERIDES: 110 mg/dL (ref <150)
Total CHOL/HDL Ratio: 2.5 Ratio
VLDL: 22 mg/dL (ref 0–40)

## 2015-02-27 LAB — VITAMIN B12: VITAMIN B 12: 588 pg/mL (ref 211–911)

## 2015-02-28 ENCOUNTER — Encounter: Payer: Self-pay | Admitting: Physician Assistant

## 2015-03-11 ENCOUNTER — Other Ambulatory Visit: Payer: Self-pay

## 2015-03-11 DIAGNOSIS — Z139 Encounter for screening, unspecified: Secondary | ICD-10-CM

## 2015-03-11 LAB — POC HEMOCCULT BLD/STL (HOME/3-CARD/SCREEN)
Card #2 Fecal Occult Blod, POC: NEGATIVE
Card #3 Fecal Occult Blood, POC: NEGATIVE
FECAL OCCULT BLD: NEGATIVE

## 2015-03-14 ENCOUNTER — Other Ambulatory Visit: Payer: Self-pay | Admitting: Physician Assistant

## 2015-03-28 ENCOUNTER — Ambulatory Visit (INDEPENDENT_AMBULATORY_CARE_PROVIDER_SITE_OTHER): Payer: Medicare HMO | Admitting: Family Medicine

## 2015-03-28 VITALS — BP 148/94

## 2015-03-28 DIAGNOSIS — Z23 Encounter for immunization: Secondary | ICD-10-CM | POA: Diagnosis not present

## 2015-03-28 NOTE — Progress Notes (Signed)
   Subjective:    Patient ID: Brandi Harrington, female    DOB: 1946-03-26, 69 y.o.   MRN: [id number]  HPI  Patient here for immunization only    Review of Systems     Objective:   Physical Exam        Assessment & Plan:

## 2015-04-15 ENCOUNTER — Other Ambulatory Visit: Payer: Self-pay | Admitting: Physician Assistant

## 2015-06-18 ENCOUNTER — Ambulatory Visit (INDEPENDENT_AMBULATORY_CARE_PROVIDER_SITE_OTHER): Payer: Medicare HMO | Admitting: Physician Assistant

## 2015-06-18 ENCOUNTER — Encounter: Payer: Self-pay | Admitting: Physician Assistant

## 2015-06-18 VITALS — BP 109/70 | HR 72 | Temp 98.3°F | Resp 16 | Ht 61.0 in | Wt 146.8 lb

## 2015-06-18 DIAGNOSIS — B9789 Other viral agents as the cause of diseases classified elsewhere: Secondary | ICD-10-CM

## 2015-06-18 DIAGNOSIS — J069 Acute upper respiratory infection, unspecified: Secondary | ICD-10-CM | POA: Diagnosis not present

## 2015-06-18 DIAGNOSIS — Z23 Encounter for immunization: Secondary | ICD-10-CM

## 2015-06-18 DIAGNOSIS — R319 Hematuria, unspecified: Secondary | ICD-10-CM | POA: Diagnosis not present

## 2015-06-18 DIAGNOSIS — L309 Dermatitis, unspecified: Secondary | ICD-10-CM | POA: Diagnosis not present

## 2015-06-18 LAB — POCT URINALYSIS DIP (MANUAL ENTRY)
BILIRUBIN UA: NEGATIVE
Bilirubin, UA: NEGATIVE
Blood, UA: NEGATIVE
GLUCOSE UA: NEGATIVE
Leukocytes, UA: NEGATIVE
Nitrite, UA: NEGATIVE
PH UA: 5.5
Protein Ur, POC: NEGATIVE
Spec Grav, UA: 1.015
Urobilinogen, UA: 0.2

## 2015-06-18 LAB — POC MICROSCOPIC URINALYSIS (UMFC): MUCUS RE: ABSENT

## 2015-06-18 MED ORDER — CLOBETASOL PROPIONATE 0.05 % EX CREA: TOPICAL_CREAM | Freq: Two times a day (BID) | CUTANEOUS | Status: DC

## 2015-06-18 NOTE — Patient Instructions (Signed)
Use some Mucinex (DM if you need something for cough). Get plenty of rest and drink at least 64 ounces of water daily.

## 2015-06-18 NOTE — Progress Notes (Signed)
Subjective:     Patient ID: Brandi Harrington, female   DOB: [birth year], 69 y.o.   MRN: [id number] PCP: JEFFERY,CHELLE, PA-C  Chief Complaint  Patient presents with  . Follow-up    COUGHING   . Hematuria    HPI Patient presents today for cold symptoms and blood in her urine.   She has had a productive cough for about 1-2 weeks now. It does not keep her up at night. No congestion. No sinus pressure. Rhinorrhea. No ear pain. No sore throat. No chest pain or shortness or breath. 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 No fever or chills.   She has left shoulder pain when she lifts up her arm. It is always sore and the soreness never really goes away.   She noticed blood after wiping 2 months ago. She had no blood on her underwear. She has not noticed any further blood. She had a little bit of blood in her mouth 2 weeks ago, which she spit out. She had not been brushing her teeth/   Review of Systems See HPI   Patient Active Problem List   Diagnosis Date Noted  . Facial paresthesia 02/26/2015  . Lipoma of back 02/26/2015  . Tobacco use 01/18/2015  . Hepatitis C antibody test positive 02/15/2013  . HTN (hypertension) 05/12/2012  . Hyperlipidemia 05/12/2012  . Osteopenia   . TMJ syndrome   . Vitamin D deficiency     Prior to Admission medications   Medication Sig Start Date End Date Taking? Authorizing Provider  alendronate (FOSAMAX) 70 MG tablet TAKE ONE TABLET BY MOUTH ONCE A WEEK WITH A FULL GLASS OF WATER ON AN EMPTY STOMACH. 04/16/15  Yes Chelle Jeffery, PA-C  amLODipine (NORVASC) 10 MG tablet Take 1 tablet (10 mg total) by mouth daily. 02/26/15  Yes Chelle Jeffery, PA-C  aspirin 81 MG tablet Take 81 mg by mouth daily.   Yes Historical Provider, MD  CALCIUM PO Take by mouth daily.   Yes Historical Provider, MD  cholecalciferol (VITAMIN D) 1000 UNITS tablet Take 1,000 Units by mouth daily.   Yes Historical Provider, MD  fish oil-omega-3 fatty acids  1000 MG capsule Take 2 g by mouth daily.   Yes Historical Provider, MD  lovastatin (MEVACOR) 40 MG tablet Take 1 tablet (40 mg total) by mouth daily. 02/26/15  Yes Chelle Jeffery, PA-C  meloxicam (MOBIC) 7.5 MG tablet Take 7.5 mg by mouth daily.   Yes Historical Provider, MD  Multiple Vitamins-Minerals (CENTRUM SILVER PO) Take by mouth.   Yes Historical Provider, MD  clobetasol cream (TEMOVATE) 0.05 % Apply topically 2 (two) times daily. Patient not taking: Reported on 02/26/2015 05/10/13   Harrison Mons, PA-C    Not on File   Objective:  Physical Exam  Constitutional: She is oriented to person, place, and time. She appears well-developed and well-nourished.  HENT:  Head: Normocephalic and atraumatic.  Right Ear: External ear normal.  Left Ear: External ear normal.  Nose: Rhinorrhea present.  Mouth/Throat: Oropharynx is clear and moist.  Mild erythema of nasal turbinates.  Eyes: Conjunctivae are normal. Pupils are equal, round, and reactive to light.  Neck: Normal range of motion. Neck supple.  Cardiovascular: Normal rate and regular rhythm.   Pulmonary/Chest: Effort normal and breath sounds normal.  Neurological: She is alert and oriented to person, place, and time.  Skin: Skin is warm and dry.  Psychiatric: She has a normal mood and affect. Her behavior  is normal. Thought content normal.     BP 109/70 mmHg  Pulse 72  Temp(Src) 98.3 F (36.8 C) (Oral)  Resp 16  Ht 5\' 1"  (1.549 m)  Wt 146 lb 12.8 oz (66.588 kg)  BMI 27.75 kg/m2    Results for orders placed or performed in visit on 06/18/15  POCT Microscopic Urinalysis ([facility])  Result Value Ref Range   WBC,UR,HPF,POC None None WBC/hpf   RBC,UR,HPF,POC None None RBC/hpf   Bacteria Few (A) None   Mucus Absent Absent   Epithelial Cells, UR Per Microscopy None None cells/hpf  POCT urinalysis dipstick  Result Value Ref Range   Color, UA yellow yellow   Clarity, UA clear clear   Glucose, UA negative negative   Bilirubin, UA  negative negative   Ketones, POC UA negative negative   Spec Grav, UA 1.015    Blood, UA negative negative   pH, UA 5.5    Protein Ur, POC negative negative   Urobilinogen, UA 0.2    Nitrite, UA Negative Negative   Leukocytes, UA Negative Negative    Assessment & Plan:  1. Hematuria Urine analysis showed no RBCs. Patient to monitor and RTC if she notices hematuria or vaginal bleeding. - POCT Microscopic Urinalysis ([facility]) - POCT urinalysis dipstick  2. Viral URI with cough Mucinex. DM if something is needed for cough. Rest. Hydrate. RTC if symptoms worsen or do not get better.     Amber D. Race, PA-S Physician Assistant Student [facility]

## 2015-06-18 NOTE — Progress Notes (Signed)
Patient ID: Brandi Harrington, female    DOB: 03/22/1946, 69 y.o.   MRN: [id number]  PCP: Wynne Dust  Subjective:   Chief Complaint  Patient presents with  . Follow-up    COUGHING   . Hematuria    HPI Presents for evaluation of cold symptoms and blood in her urine.   She has had a productive cough for about 1-2 weeks now. It does not keep her up at night. No congestion. No sinus pressure. Rhinorrhea. No ear pain. No sore throat. No chest pain or shortness or breath. 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 No fever or chills.   She has left shoulder pain when she lifts up her arm. It is always sore and the soreness never really goes away.   She noticed blood on the toilet tissue after wiping 2 months ago. She had no blood on her underwear. She has not noticed any further blood.   She had a little bit of blood in her mouth 2 weeks ago, which she spit out. She had not been brushing her teeth, and did not cough it up. Has had no additional episodes of blood in her mouth. She does know that her partial plate is ill-fitting, and she is saving up for a new one, hopefully in January.  Her second husband, from whom she divorced last year after many unhappy years together, died about 6 weeks ago. She reports that she is not bothered by his death.  Review of Systems  Constitutional: Negative for fever, chills, diaphoresis and fatigue.  HENT: Positive for dental problem. Negative for mouth sores, nosebleeds, postnasal drip, rhinorrhea, sinus pressure, sneezing, sore throat and voice change.   Eyes: Negative for visual disturbance.  Respiratory: Positive for cough. Negative for choking, chest tightness, shortness of breath and wheezing.   Cardiovascular: Negative for chest pain, palpitations and leg swelling.  Gastrointestinal: Negative for nausea, vomiting, diarrhea, constipation, blood in stool and anal bleeding.  Genitourinary: Negative for dysuria,  urgency, frequency, hematuria, flank pain, vaginal bleeding, vaginal discharge, difficulty urinating, genital sores, menstrual problem and dyspareunia.  Musculoskeletal: Positive for arthralgias. Negative for myalgias, back pain, joint swelling, gait problem and neck pain.  Skin: Positive for rash (episodic, anterior chest. Needs refill of steroid cream.). Negative for wound.  Allergic/Immunologic: Negative for environmental allergies.  Neurological: Negative for dizziness, weakness and headaches.  Hematological: Negative for adenopathy. Does not bruise/bleed easily.  Psychiatric/Behavioral: Negative for sleep disturbance and dysphoric mood. The patient is not nervous/anxious.        Patient Active Problem List   Diagnosis Date Noted  . Facial paresthesia 02/26/2015  . Lipoma of back 02/26/2015  . Tobacco use 01/18/2015  . Hepatitis C antibody test positive 02/15/2013  . HTN (hypertension) 05/12/2012  . Hyperlipidemia 05/12/2012  . Osteopenia   . TMJ syndrome   . Vitamin D deficiency      Prior to Admission medications   Medication Sig Start Date End Date Taking? Authorizing Provider  alendronate (FOSAMAX) 70 MG tablet TAKE ONE TABLET BY MOUTH ONCE A WEEK WITH A FULL GLASS OF WATER ON AN EMPTY STOMACH. 04/16/15  Yes Chelle Jeffery, PA-C  amLODipine (NORVASC) 10 MG tablet Take 1 tablet (10 mg total) by mouth daily. 02/26/15  Yes Chelle Jeffery, PA-C  aspirin 81 MG tablet Take 81 mg by mouth daily.   Yes Historical Provider, MD  CALCIUM PO Take by mouth daily.   Yes Historical Provider, MD  cholecalciferol (VITAMIN D) 1000 UNITS tablet Take 1,000 Units by mouth daily.   Yes Historical Provider, MD  fish oil-omega-3 fatty acids 1000 MG capsule Take 2 g by mouth daily.   Yes Historical Provider, MD  lovastatin (MEVACOR) 40 MG tablet Take 1 tablet (40 mg total) by mouth daily. 02/26/15  Yes Chelle Jeffery, PA-C  meloxicam (MOBIC) 7.5 MG tablet Take 7.5 mg by mouth daily.   Yes Historical  Provider, MD  Multiple Vitamins-Minerals (CENTRUM SILVER PO) Take by mouth.   Yes Historical Provider, MD  clobetasol cream (TEMOVATE) 0.05 % Apply topically 2 (two) times daily. Patient not taking: Reported on 02/26/2015 05/10/13   Harrison Mons, PA-C     Not on File     Objective:  Physical Exam  Constitutional: She is oriented to person, place, and time. Vital signs are normal. She appears well-developed and well-nourished. She is active and cooperative. No distress.  BP 109/70 mmHg  Pulse 72  Temp(Src) 98.3 F (36.8 C) (Oral)  Resp 16  Ht 5\' 1"  (1.549 m)  Wt 146 lb 12.8 oz (66.588 kg)  BMI 27.75 kg/m2  HENT:  Head: Normocephalic and atraumatic.  Right Ear: Hearing normal.  Left Ear: Hearing normal.  Eyes: Conjunctivae are normal. No scleral icterus.  Neck: Normal range of motion. Neck supple. No thyromegaly present.  Cardiovascular: Normal rate, regular rhythm and normal heart sounds.   Pulses:      Radial pulses are 2+ on the right side, and 2+ on the left side.  Pulmonary/Chest: Effort normal and breath sounds normal.  Lymphadenopathy:       Head (right side): No tonsillar, no preauricular, no posterior auricular and no occipital adenopathy present.       Head (left side): No tonsillar, no preauricular, no posterior auricular and no occipital adenopathy present.    She has no cervical adenopathy.       Right: No supraclavicular adenopathy present.       Left: No supraclavicular adenopathy present.  Neurological: She is alert and oriented to person, place, and time. No sensory deficit.  Skin: Skin is warm, dry and intact. No rash noted. No cyanosis or erythema. Nails show no clubbing.  Psychiatric: She has a normal mood and affect. Her speech is normal and behavior is normal.       Results for orders placed or performed in visit on 06/18/15  POCT Microscopic Urinalysis ([facility])  Result Value Ref Range   WBC,UR,HPF,POC None None WBC/hpf   RBC,UR,HPF,POC None None  RBC/hpf   Bacteria Few (A) None   Mucus Absent Absent   Epithelial Cells, UR Per Microscopy None None cells/hpf  POCT urinalysis dipstick  Result Value Ref Range   Color, UA yellow yellow   Clarity, UA clear clear   Glucose, UA negative negative   Bilirubin, UA negative negative   Ketones, POC UA negative negative   Spec Grav, UA 1.015    Blood, UA negative negative   pH, UA 5.5    Protein Ur, POC negative negative   Urobilinogen, UA 0.2    Nitrite, UA Negative Negative   Leukocytes, UA Negative Negative       Assessment & Plan:   1. Hematuria No blood in the urine. She's to notify me if this recurs. - POCT Microscopic Urinalysis ([facility]) - POCT urinalysis dipstick  2. Viral URI with cough Supportive care. If persists, RTC for additional evaluation and possibly CXR.  3. Dermatitis Stable. Continue PRN use of clobetasol cream. -  clobetasol cream (TEMOVATE) 0.05 %; Apply topically 2 (two) times daily.  Dispense: 30 g; Refill: 0   Fara Chute, PA-C Physician Assistant-Certified [facility]

## 2015-06-20 ENCOUNTER — Encounter: Payer: Self-pay | Admitting: Physician Assistant

## 2015-06-20 LAB — URINE CULTURE
COLONY COUNT: NO GROWTH
ORGANISM ID, BACTERIA: NO GROWTH

## 2015-07-22 ENCOUNTER — Other Ambulatory Visit: Payer: Self-pay | Admitting: Physician Assistant

## 2015-09-16 ENCOUNTER — Ambulatory Visit (INDEPENDENT_AMBULATORY_CARE_PROVIDER_SITE_OTHER): Payer: Medicare HMO | Admitting: Family Medicine

## 2015-09-16 DIAGNOSIS — Z23 Encounter for immunization: Secondary | ICD-10-CM

## 2015-09-16 NOTE — Progress Notes (Signed)
   Subjective:    Patient ID: Brandi Harrington, female    DOB: [birth year], 69 y.o.   MRN: [id number]  HPI  Patient presents for 3rd Hepatitis B vaccine  Review of Systems     Objective:   Physical Exam        Assessment & Plan:  Patient received 3rd Hepatitis B shot in [place] and is finished with the series

## 2015-10-24 ENCOUNTER — Other Ambulatory Visit: Payer: Self-pay | Admitting: Physician Assistant

## 2016-01-09 ENCOUNTER — Other Ambulatory Visit: Payer: Self-pay | Admitting: Physician Assistant

## 2016-01-27 ENCOUNTER — Ambulatory Visit (INDEPENDENT_AMBULATORY_CARE_PROVIDER_SITE_OTHER): Payer: Medicare HMO | Admitting: Physician Assistant

## 2016-01-27 ENCOUNTER — Other Ambulatory Visit: Payer: Self-pay | Admitting: Physician Assistant

## 2016-01-27 VITALS — BP 146/82 | HR 65 | Temp 98.2°F | Resp 16 | Ht 61.0 in | Wt 149.0 lb

## 2016-01-27 DIAGNOSIS — K649 Unspecified hemorrhoids: Secondary | ICD-10-CM

## 2016-01-27 NOTE — Progress Notes (Signed)
Patient ID: Brandi Harrington, female    DOB: 06-18-1946, 69 y.o.   MRN: [id number]  PCP: Olene Floss  Subjective:   Chief Complaint  Patient presents with  . abcess    boil / x 2 wks    HPI Presents for evaluation of a possible boil.  She reports a lump at the anus. Not tender, but intermittently uncomfortable. Difficult to clean around after BM. No blood in the stool. No drainage from the lump.    Review of Systems As above.    Patient Active Problem List   Diagnosis Date Noted  . Facial paresthesia 02/26/2015  . Lipoma of back 02/26/2015  . Tobacco use 01/18/2015  . Hepatitis C antibody test positive 02/15/2013  . HTN (hypertension) 05/12/2012  . Hyperlipidemia 05/12/2012  . Osteopenia   . TMJ syndrome   . Vitamin D deficiency      Prior to Admission medications   Medication Sig Start Date End Date Taking? Authorizing Provider  alendronate (FOSAMAX) 70 MG tablet TAKE ONE TABLET BY MOUTH ONCE A WEEK WITH A FULL GLASS OF WATER ON AN EMPTY STOMACH 10/25/15  Yes Lilac Hoff, PA-C  amLODipine (NORVASC) 10 MG tablet Take 1 tablet (10 mg total) by mouth daily. 02/26/15  Yes Laniqua Torrens, PA-C  aspirin 81 MG tablet Take 81 mg by mouth daily.   Yes Historical Provider, MD  CALCIUM PO Take by mouth daily.   Yes Historical Provider, MD  cholecalciferol (VITAMIN D) 1000 UNITS tablet Take 1,000 Units by mouth daily.   Yes Historical Provider, MD  clobetasol cream (TEMOVATE) 0.05 % Apply topically 2 (two) times daily. 06/18/15  Yes Evanny Ellerbe, PA-C  fish oil-omega-3 fatty acids 1000 MG capsule Take 2 g by mouth daily.   Yes Historical Provider, MD  lovastatin (MEVACOR) 40 MG tablet Take 1 tablet (40 mg total) by mouth daily. 02/26/15  Yes Broady Lafoy, PA-C  meloxicam (MOBIC) 7.5 MG tablet Take 7.5 mg by mouth daily.   Yes Historical Provider, MD  Multiple Vitamins-Minerals (CENTRUM SILVER PO) Take by mouth.   Yes Historical Provider, MD     No Known Allergies    Objective:  Physical Exam  Constitutional: She is oriented to person, place, and time. She appears well-developed and well-nourished. She is active and cooperative. No distress.  BP 146/82 mmHg  Pulse 65  Temp(Src) 98.2 F (36.8 C) (Oral)  Resp 16  Ht 5\' 1"  (1.549 m)  Wt 149 lb (67.586 kg)  BMI 28.17 kg/m2  SpO2 96%   Eyes: Conjunctivae are normal.  Pulmonary/Chest: Effort normal.  Genitourinary:     Neurological: She is alert and oriented to person, place, and time.  Psychiatric: She has a normal mood and affect. Her speech is normal and behavior is normal.           Assessment & Plan:   1. Hemorrhoids, unspecified hemorrhoid type Anticipatory guidance provided. Counseled on hygiene. Anticipatory guidance provided. She has an appointment with me next moth. Plan to complete the pneumococcal vaccine series then (she declines it today). BP is elevated, likely due to concern about the lump. Will consider additional treatment at her next visit if BP remains above goal.   Fernande Bras, PA-C Physician Assistant-Certified [facility]

## 2016-01-27 NOTE — Patient Instructions (Addendum)
IF you received an x-ray today, you will receive an invoice from [facility]. Please contact [facility] at [phone] with questions or concerns regarding your invoice.   IF you received labwork today, you will receive an invoice from [employer]. Please contact [facility] at [phone] with questions or concerns regarding your invoice.   Our billing staff will not be able to assist you with questions regarding bills from these companies.  You will be contacted with the lab results as soon as they are available. The fastest way to get your results is to activate your My Chart account. Instructions are located on the last page of this paperwork. If you have not heard from [place] regarding the results in 2 weeks, please contact this office.    Hemorrhoids Hemorrhoids are swollen veins around the rectum or anus. There are two types of hemorrhoids:   Internal hemorrhoids. These occur in the veins just inside the rectum. They may poke through to the outside and become irritated and painful.  External hemorrhoids. These occur in the veins outside the anus and can be felt as a painful swelling or hard lump near the anus. CAUSES  Pregnancy.   Obesity.   Constipation or diarrhea.   Straining to have a bowel movement.   Sitting for long periods on the toilet.  Heavy lifting or other activity that caused you to strain.  Anal intercourse. SYMPTOMS   Pain.   Anal itching or irritation.   Rectal bleeding.   Fecal leakage.   Anal swelling.   One or more lumps around the anus.  DIAGNOSIS  Your caregiver may be able to diagnose hemorrhoids by visual examination. Other examinations or tests that may be performed include:   Examination of the rectal area with a gloved hand (digital rectal exam).   Examination of anal canal using a small tube (scope).   A blood test if you have lost a significant amount of blood.  A test  to look inside the colon (sigmoidoscopy or colonoscopy). TREATMENT Most hemorrhoids can be treated at home. However, if symptoms do not seem to be getting better or if you have a lot of rectal bleeding, your caregiver may perform a procedure to help make the hemorrhoids get smaller or remove them completely. Possible treatments include:   Placing a rubber band at the base of the hemorrhoid to cut off the circulation (rubber band ligation).   Injecting a chemical to shrink the hemorrhoid (sclerotherapy).   Using a tool to burn the hemorrhoid (infrared light therapy).   Surgically removing the hemorrhoid (hemorrhoidectomy).   Stapling the hemorrhoid to block blood flow to the tissue (hemorrhoid stapling).  HOME CARE INSTRUCTIONS   Eat foods with fiber, such as whole grains, beans, nuts, fruits, and vegetables. Ask your doctor about taking products with added fiber in them (fibersupplements).  Increase fluid intake. Drink enough water and fluids to keep your urine clear or pale yellow.   Exercise regularly.   Go to the bathroom when you have the urge to have a bowel movement. Do not wait.   Avoid straining to have bowel movements.   Keep the anal area dry and clean. Use wet toilet paper or moist towelettes after a bowel movement.   Medicated creams and suppositories may be used or applied as directed.   Only take over-the-counter or prescription medicines as directed by your caregiver.   Take warm sitz baths for 15-20 minutes, 3-4 times a day to ease  pain and discomfort.   Place ice packs on the hemorrhoids if they are tender and swollen. Using ice packs between sitz baths may be helpful.   Put ice in a plastic bag.   Place a towel between your skin and the bag.   Leave the ice on for 15-20 minutes, 3-4 times a day.   Do not use a donut-shaped pillow or sit on the toilet for long periods. This increases blood pooling and pain.  SEEK MEDICAL CARE IF:  You  have increasing pain and swelling that is not controlled by treatment or medicine.  You have uncontrolled bleeding.  You have difficulty or you are unable to have a bowel movement.  You have pain or inflammation outside the area of the hemorrhoids. MAKE SURE YOU:  Understand these instructions.  Will watch your condition.  Will get help right away if you are not doing well or get worse.   This information is not intended to replace advice given to you by your health care provider. Make sure you discuss any questions you have with your health care provider.   Document Released: 09/04/2000 Document Revised: 08/24/2012 Document Reviewed: 07/12/2012 Elsevier Interactive Patient Education [employer].

## 2016-02-27 ENCOUNTER — Encounter: Payer: Medicare HMO | Admitting: Physician Assistant

## 2016-03-03 ENCOUNTER — Ambulatory Visit (INDEPENDENT_AMBULATORY_CARE_PROVIDER_SITE_OTHER): Payer: Medicare HMO | Admitting: Physician Assistant

## 2016-03-03 ENCOUNTER — Encounter: Payer: Self-pay | Admitting: Physician Assistant

## 2016-03-03 VITALS — BP 118/76 | HR 73 | Temp 98.1°F | Resp 16 | Ht 60.5 in | Wt 150.4 lb

## 2016-03-03 DIAGNOSIS — Z0000 Encounter for general adult medical examination without abnormal findings: Secondary | ICD-10-CM | POA: Diagnosis not present

## 2016-03-03 DIAGNOSIS — I10 Essential (primary) hypertension: Secondary | ICD-10-CM | POA: Diagnosis not present

## 2016-03-03 DIAGNOSIS — Z720 Tobacco use: Secondary | ICD-10-CM | POA: Diagnosis not present

## 2016-03-03 DIAGNOSIS — E559 Vitamin D deficiency, unspecified: Secondary | ICD-10-CM | POA: Diagnosis not present

## 2016-03-03 DIAGNOSIS — M8580 Other specified disorders of bone density and structure, unspecified site: Secondary | ICD-10-CM

## 2016-03-03 DIAGNOSIS — E785 Hyperlipidemia, unspecified: Secondary | ICD-10-CM | POA: Diagnosis not present

## 2016-03-03 DIAGNOSIS — Z23 Encounter for immunization: Secondary | ICD-10-CM | POA: Diagnosis not present

## 2016-03-03 MED ORDER — AMLODIPINE BESYLATE 10 MG PO TABS: 10.0000 mg | ORAL_TABLET | Freq: Every day | ORAL | Status: DC

## 2016-03-03 MED ORDER — LOVASTATIN 40 MG PO TABS: 40.0000 mg | ORAL_TABLET | Freq: Every day | ORAL | Status: DC

## 2016-03-03 MED ORDER — MELOXICAM 7.5 MG PO TABS: 7.5000 mg | ORAL_TABLET | Freq: Every day | ORAL | Status: DC

## 2016-03-03 NOTE — Patient Instructions (Addendum)
IF you received an x-ray today, you will receive an invoice from [facility]. Please contact [facility] at [phone] with questions or concerns regarding your invoice.   IF you received labwork today, you will receive an invoice from [employer]. Please contact [facility] at [phone] with questions or concerns regarding your invoice.   Our billing staff will not be able to assist you with questions regarding bills from these companies.  You will be contacted with the lab results as soon as they are available. The fastest way to get your results is to activate your My Chart account. Instructions are located on the last page of this paperwork. If you have not heard from [place] regarding the results in 2 weeks, please contact this office.     Keeping You Healthy  Get These Tests  Blood Pressure- Have your blood pressure checked by your healthcare provider at least once a year.  Normal blood pressure is 120/80.  Weight- Have your body mass index (BMI) calculated to screen for obesity.  BMI is a measure of body fat based on height and weight.  You can calculate your own BMI at [employer].it  Cholesterol- Have your cholesterol checked every year.  Diabetes- Have your blood sugar checked every year if you have high blood pressure, high cholesterol, a family history of diabetes or if you are overweight.  Pap Test - Have a pap test every 1 to 5 years if you have been sexually active.  If you are older than 65 and recent pap tests have been normal you may not need additional pap tests.  In addition, if you have had a hysterectomy  for benign disease additional pap tests are not necessary.  Mammogram-Yearly mammograms are essential for early detection of breast cancer  Screening for Colon Cancer- Colonoscopy starting at age 35. Screening may begin sooner depending on your family history and other health conditions.  Follow up  colonoscopy as directed by your Gastroenterologist.  Screening for Osteoporosis- Screening begins at age 30 with bone density scanning, sooner if you are at higher risk for developing Osteoporosis.  Get these medicines  Calcium with Vitamin D- Your body requires 1200-1500 mg of Calcium a day and [phone] IU of Vitamin D a day.  You can only absorb 500 mg of Calcium at a time therefore Calcium must be taken in 2 or 3 separate doses throughout the day.  Hormones- Hormone therapy has been associated with increased risk for certain cancers and heart disease.  Talk to your healthcare provider about if you need relief from menopausal symptoms.  Aspirin- Ask your healthcare provider about taking Aspirin to prevent Heart Disease and Stroke.  Get these Immuniztions  Flu shot- Every fall  Pneumonia shot- Once after the age of 21; if you are younger ask your healthcare provider if you need a pneumonia shot.  Tetanus- Every ten years.  Zostavax- Once after the age of 59 to prevent shingles.  Take these steps  Don't smoke- Your healthcare provider can help you quit. For tips on how to quit, ask your healthcare provider or go to www.smokefree.gov or call 1-800 QUIT-NOW.  Be physically active- Exercise 5 days a week for a minimum of 30 minutes.  If you are not already physically active, start slow and gradually work up to 30 minutes of moderate physical activity.  Try walking, dancing, bike riding, swimming, etc.  Eat a healthy diet- Eat a variety of healthy foods such as fruits, vegetables,  whole grains, low fat milk, low fat cheeses, yogurt, lean meats, chicken, fish, eggs, dried beans, tofu, etc.  For more information go to www.thenutritionsource.org  Dental visit- Brush and floss teeth twice daily; visit your dentist twice a year.  Eye exam- Visit your Optometrist or Ophthalmologist yearly.  Drink alcohol in moderation- Limit alcohol intake to one drink or less a day.  Never drink and  drive.  Depression- Your emotional health is as important as your physical health.  If you're feeling down or losing interest in things you normally enjoy, please talk to your healthcare provider.  Seat Belts- can save your life; always wear one  Smoke/Carbon Monoxide detectors- These detectors need to be installed on the appropriate level of your home.  Replace batteries at least once a year.  Violence- If anyone is threatening or hurting you, please tell your healthcare provider.  Living Will/ Health care power of attorney- Discuss with your healthcare provider and family.    Did you know that you begin to benefit from quitting smoking within the first twenty minutes? It's TRUE.  At 20 minutes: -blood pressure decreases -pulse rate drops -body temperature of hands and feet increases  At 8 hours: -carbon monoxide level in blood drops to normal -oxygen level in blood increases to normal  At 24 hours: -the chance of heart attack decreases  At 48 hours: -nerve endings start regrowing -ability to smell and taste is enhanced  2 weeks-3 months: -circulation improves -walking becomes easier -lung function improves  1-9 months: -coughing, sinus congestion, fatigue and shortness of breath decreases  1 year: -excess risk of heart disease is decreased to HALF that of a smoker  5 years: Stroke risk is reduced to that of people who have never smoked  10 years: -risk of lung cancer drops to as little as half that of continuing smokers -risk of cancer of the mouth, throat, esophagus, bladder, kidney and pancreas decreases -risk of ulcer decreases  15 years -risk of heart disease is now similar to that of people who have never smoked -risk of death returns to nearly the level of people who have never smoked

## 2016-03-03 NOTE — Progress Notes (Signed)
   Subjective:    Patient ID: Brandi Harrington, female    DOB: 26-Dec-1945, 70 y.o.   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

## 2016-03-03 NOTE — Progress Notes (Signed)
Brandi Harrington  03/12/1946 70 y.o. female  Presents today for [employer] Visit-Subsequent.  Other items to address today: none   Cancer Screening: Cervical: last pap 2011, no history of abnormal pap Breast: yes, 2016, repeat in 11/2016 Colon: yes, 2007. Due for repeat 03/2016.  Prostate: n/a    Immunization status: up to date and documented, pneumovax due today.   ADVANCE DIRECTIVES: Discussed: yes On File: no Materials Provided: has an outdated will, living will, HCPOA; needs to get them updated since her divorce.   Last screening for diabetes: negative glucosuria 05/2015 Last EKG: 01/23/2015, no acute findings, evidence of old anteroseptal infarct, atrial enlargement, RIGHT axis deviation. Last lipid screening: 02/2015, TC 226, TG 110, HDL 92, LDL 112   Patient Active Problem List   Diagnosis Date Noted  . Facial paresthesia 02/26/2015  . Lipoma of back 02/26/2015  . Tobacco use 01/18/2015  . Hepatitis C antibody test positive 02/15/2013  . HTN (hypertension) 05/12/2012  . Hyperlipidemia 05/12/2012  . Osteopenia   . TMJ syndrome   . Vitamin D deficiency      Past Medical History  Diagnosis Date  . Hypertension   . Hyperlipidemia   . Osteopenia   . TMJ syndrome   . Vitamin D deficiency      Past Surgical History  Procedure Laterality Date  . Tubal ligation       Family History  Problem Relation Age of Onset  . Diabetes Sister   . Scoliosis Sister   . Arthritis Sister   . Alcohol abuse Daughter   . Heart disease Son     dysrrhythmia     Social History   Social History  . Marital Status: Divorced    Spouse Name: n/a  . Number of Children: 2  . Years of Education: 12th grade   Occupational History  . Owner     [employer]   Social History Main Topics  . Smoking status: Current Some Day Smoker -- 0.30 packs/day    Types: Cigarettes  . Smokeless tobacco: Never Used     Comment: smokes a few cigarettes after work when she  drinks a beer  . Alcohol Use: 0.0 - 1.5 oz/week    0-3 Standard drinks or equivalent per week     Comment: socially  . Drug Use: No  . Sexual Activity:    Partners: Male    Birth Control/ Protection: Post-menopausal   Other Topics Concern  . Not on file   Social History Narrative   House on the [place] Chapter 13 bankruptcy (not her business)-couldn't get a loan modification.  Ex-husband s/p CVA x several, residual left hemiparesis, DM diagnosed at age 89, ASCVD (severe), s/p CABG . He's ill-humored and "mean." He moved into an assisted living facility fall 2014, and they divorced in 10/2014.   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 Authorizing Provider  alendronate (FOSAMAX) 70 MG tablet TAKE ONE TABLET BY MOUTH ONCE A WEEK WITH A FULL GLASS OF WATER ON AN EMPTY STOMACH 10/25/15  Yes Thornton Dohrmann, PA-C  amLODipine (NORVASC) 10 MG tablet Take 1 tablet (10 mg total) by mouth daily. 02/26/15  Yes Evyn Kooyman, PA-C  aspirin 81 MG tablet Take 81 mg by mouth daily.   Yes Historical  Provider, MD  CALCIUM PO Take by mouth daily.   Yes Historical Provider, MD  cholecalciferol (VITAMIN D) 1000 UNITS tablet Take 1,000 Units by mouth daily.   Yes Historical Provider, MD  fish oil-omega-3 fatty acids 1000 MG capsule Take 2 g by mouth daily.   Yes Historical Provider, MD  lovastatin (MEVACOR) 40 MG tablet Take 1 tablet (40 mg total) by mouth daily. 02/26/15  Yes Babita Amaker, PA-C  meloxicam (MOBIC) 7.5 MG tablet Take 7.5 mg by mouth daily.   Yes Historical Provider, MD  Multiple Vitamins-Minerals (CENTRUM SILVER PO) Take by mouth.   Yes Historical Provider, MD  clobetasol cream (TEMOVATE) 0.05 % Apply topically 2 (two) times daily. Patient not taking: Reported on 03/03/2016 06/18/15   Porfirio Oar, PA-C  meloxicam (MOBIC) 7.5 MG  tablet TAKE ONE TABLET BY MOUTH ONCE DAILY 01/30/16   Porfirio Oar, PA-C     Depression screen [facility] 2/9 03/03/2016 01/27/2016 06/18/2015 02/26/2015 01/18/2015  Decreased Interest 0 0 0 0 0  Down, Depressed, Hopeless 0 0 0 0 0  PHQ - 2 Score 0 0 0 0 0     Fall Risk  03/03/2016 01/27/2016 06/18/2015 02/26/2015  Falls in the past year? No No No No      PHYSICAL EXAM: Physical Exam  Constitutional: She is oriented to person, place, and time. She appears well-developed and well-nourished. She is active and cooperative. No distress.  BP 118/76 mmHg  Pulse 73  Temp(Src) 98.1 F (36.7 C) (Oral)  Resp 16  Ht 5' 0.5" (1.537 m)  Wt 150 lb 6.4 oz (68.221 kg)  BMI 28.88 kg/m2  SpO2 97%   HENT:  Head: Normocephalic and atraumatic.  Right Ear: Hearing normal.  Left Ear: Hearing normal.  Mouth/Throat: Uvula is midline, oropharynx is clear and moist and mucous membranes are normal. No oral lesions. Normal dentition. No uvula swelling.  Eyes: Conjunctivae and EOM are normal. Pupils are equal, round, and reactive to light. No scleral icterus.  Neck: Phonation normal. Neck supple. No spinous process tenderness and no muscular tenderness present. No thyromegaly present.  Cardiovascular: Normal rate, regular rhythm, normal heart sounds and intact distal pulses.   Pulmonary/Chest: Effort normal and breath sounds normal.  Abdominal: Soft. Bowel sounds are normal. There is no tenderness.  Lymphadenopathy:    She has no cervical adenopathy.  Neurological: She is alert and oriented to person, place, and time.  Skin: Skin is warm and dry. No rash noted.  Psychiatric: She has a normal mood and affect. Her speech is normal and behavior is normal. Judgment and thought content normal. Cognition and memory are normal.       Visual Acuity Screening   Right eye Left eye Both eyes  Without correction:     With correction: 20/30 20/30 20/20      BP 118/76 mmHg  Pulse 73  Temp(Src) 98.1 F (36.7 C) (Oral)   Resp 16  Ht 5' 0.5" (1.537 m)  Wt 150 lb 6.4 oz (68.221 kg)  BMI 28.88 kg/m2  SpO2 97%   Education/Counseling: yes diet and exercise yes prevention of chronic diseases yes smoking/tobacco cessation yes review "Covered Medicare Preventive Services"    ASSESSMENT/PLAN: 1. Medicare annual wellness visit, subsequent Age appropriate anticipatory guidance provided.  2. Need for Tdap vaccination She will check with her secondary insurance regarding coverage for this vaccine.  3. Need for pneumococcal vaccination - Pneumococcal polysaccharide vaccine 23-valent greater than or equal to 2yo subcutaneous/IM  4. Osteopenia 5. Vitamin D  deficiency Continue vitamin D supplement. - DG Bone Density; Future  6. Tobacco use Encouraged smoking cessation. SHe isn't ready yet.  7. Hyperlipidemia Normal lipids last year on current regimen. - lovastatin (MEVACOR) 40 MG tablet; Take 1 tablet (40 mg total) by mouth daily.  Dispense: 90 tablet; Refill: 3  8. Essential hypertension Controlled. Continue current regimen. - amLODipine (NORVASC) 10 MG tablet; Take 1 tablet (10 mg total) by mouth daily.  Dispense: 90 tablet; Refill: 3   Fernande Bras, PA-C Physician Assistant-Certified [facility]

## 2016-03-04 ENCOUNTER — Telehealth: Payer: Self-pay

## 2016-03-04 NOTE — Telephone Encounter (Signed)
Spoke with pt, advised to RTC. Pt states if it is not better by tomorrow then she will come in.

## 2016-03-04 NOTE — Telephone Encounter (Signed)
Patient was seen yesterday and is having swelling in the area she had her shot. Patient states that the swelling in spreading to the front of her arm and wants to know if this is normal. Please advise!  [phone]

## 2016-04-02 ENCOUNTER — Other Ambulatory Visit: Payer: Self-pay | Admitting: Physician Assistant

## 2016-04-02 DIAGNOSIS — Z1231 Encounter for screening mammogram for malignant neoplasm of breast: Secondary | ICD-10-CM

## 2016-04-10 ENCOUNTER — Ambulatory Visit
Admission: RE | Admit: 2016-04-10 | Discharge: 2016-04-10 | Disposition: A | Discharge status: Home / Self Care | Payer: Medicare HMO | Source: Ambulatory Visit | Attending: Physician Assistant | Admitting: Physician Assistant

## 2016-04-10 DIAGNOSIS — Z1231 Encounter for screening mammogram for malignant neoplasm of breast: Secondary | ICD-10-CM

## 2016-04-17 DIAGNOSIS — Z0100 Encounter for examination of eyes and vision without abnormal findings: Secondary | ICD-10-CM | POA: Diagnosis not present

## 2016-04-17 DIAGNOSIS — H524 Presbyopia: Secondary | ICD-10-CM | POA: Diagnosis not present

## 2016-04-22 ENCOUNTER — Other Ambulatory Visit: Payer: Self-pay | Admitting: Physician Assistant

## 2016-06-03 DIAGNOSIS — E785 Hyperlipidemia, unspecified: Secondary | ICD-10-CM | POA: Diagnosis not present

## 2016-06-03 DIAGNOSIS — I10 Essential (primary) hypertension: Secondary | ICD-10-CM | POA: Diagnosis not present

## 2016-06-03 DIAGNOSIS — K219 Gastro-esophageal reflux disease without esophagitis: Secondary | ICD-10-CM | POA: Diagnosis not present

## 2016-06-03 DIAGNOSIS — M1990 Unspecified osteoarthritis, unspecified site: Secondary | ICD-10-CM | POA: Diagnosis not present

## 2016-06-25 ENCOUNTER — Ambulatory Visit (INDEPENDENT_AMBULATORY_CARE_PROVIDER_SITE_OTHER): Payer: Medicare HMO | Admitting: Physician Assistant

## 2016-06-25 ENCOUNTER — Encounter: Payer: Self-pay | Admitting: Physician Assistant

## 2016-06-25 VITALS — BP 138/72 | HR 66 | Temp 97.8°F | Resp 16 | Wt 149.8 lb

## 2016-06-25 DIAGNOSIS — J00 Acute nasopharyngitis [common cold]: Secondary | ICD-10-CM | POA: Diagnosis not present

## 2016-06-25 DIAGNOSIS — I10 Essential (primary) hypertension: Secondary | ICD-10-CM

## 2016-06-25 DIAGNOSIS — M8580 Other specified disorders of bone density and structure, unspecified site: Secondary | ICD-10-CM | POA: Diagnosis not present

## 2016-06-25 DIAGNOSIS — Z23 Encounter for immunization: Secondary | ICD-10-CM | POA: Diagnosis not present

## 2016-06-25 DIAGNOSIS — Z6829 Body mass index (BMI) 29.0-29.9, adult: Secondary | ICD-10-CM | POA: Insufficient documentation

## 2016-06-25 DIAGNOSIS — R42 Dizziness and giddiness: Secondary | ICD-10-CM

## 2016-06-25 MED ORDER — ALENDRONATE SODIUM 70 MG PO TABS: ORAL_TABLET | ORAL | 3 refills | Status: DC

## 2016-06-25 MED ORDER — LORATADINE 10 MG PO TABS: 10.0000 mg | ORAL_TABLET | Freq: Every day | ORAL | 0 refills | Status: DC

## 2016-06-25 MED ORDER — AZELASTINE HCL 0.15 % NA SOLN: 2.0000 | Freq: Two times a day (BID) | NASAL | 0 refills | Status: DC

## 2016-06-25 NOTE — Progress Notes (Signed)
Patient ID: Brandi Harrington, female    DOB: July 22, 1946, 70 y.o.   MRN: [id number]  PCP: Porfirio Oar, PA-C  Subjective:   Chief Complaint  Patient presents with  . Hypertension    last few readings at home were high    HPI Presents for evaluation of blood pressure.   Yesterday she felt "swimmy headed." Wondered if her BP was elevated, and became very anxious about it. She checked her BP at home and it was 155/90's  Went to bed early. Just felt terrible about the possibility that her BP was elevated and causing her symptoms. This morning she awoke at about 4 am and reading was 144/80's. Her neighbor checked it again about 7:45 am and it was 160's/? The "swimmy headed" feeling persists, and is mild. Not positional. No spinning. No nausea. No visual disturbance. No chest pain. No SOB.  Ate shredded wheat and a banana for breakfast. May have eaten increased salt yesterday.  Had a cold and cough about 1-2 weeks ago, but that has resolved. No residual symptoms.   Review of Systems As above.    Patient Active Problem List   Diagnosis Date Noted  . BMI 28.0-28.9,adult 06/25/2016  . Facial paresthesia 02/26/2015  . Lipoma of back 02/26/2015  . Tobacco use 01/18/2015  . Hepatitis C antibody test positive 02/15/2013  . HTN (hypertension) 05/12/2012  . Hyperlipidemia 05/12/2012  . Osteopenia   . TMJ syndrome   . Vitamin D deficiency      Prior to Admission medications   Medication Sig Start Date End Date Taking? Authorizing Provider  alendronate (FOSAMAX) 70 MG tablet TAKE ONE TABLET BY MOUTH ONCE A WEEK WITH A FULL GLASS OF WATER ON AN EMPTY STOMACH 04/24/16  Yes Terryl Molinelli, PA-C  amLODipine (NORVASC) 10 MG tablet Take 1 tablet (10 mg total) by mouth daily. 03/03/16  Yes Darrell Hauk, PA-C  aspirin 81 MG tablet Take 81 mg by mouth daily.   Yes Historical Provider, MD  CALCIUM PO Take by mouth daily.   Yes Historical Provider, MD  cholecalciferol (VITAMIN D) 1000  UNITS tablet Take 1,000 Units by mouth daily.   Yes Historical Provider, MD  fish oil-omega-3 fatty acids 1000 MG capsule Take 2 g by mouth daily.   Yes Historical Provider, MD  lovastatin (MEVACOR) 40 MG tablet Take 1 tablet (40 mg total) by mouth daily. 03/03/16  Yes Angelis Gates, PA-C  meloxicam (MOBIC) 7.5 MG tablet Take 1 tablet (7.5 mg total) by mouth daily. 03/03/16  Yes Samiah Ricklefs, PA-C  Multiple Vitamins-Minerals (CENTRUM SILVER PO) Take by mouth.   Yes Historical Provider, MD  clobetasol cream (TEMOVATE) 0.05 % Apply topically 2 (two) times daily. Patient not taking: Reported on 06/25/2016 06/18/15   Porfirio Oar, PA-C     No Known Allergies     Objective:  Physical Exam  Constitutional: She is oriented to person, place, and time. She appears well-developed and well-nourished. She is active and cooperative. No distress.  BP 138/72 (BP Location: Right Arm, Cuff Size: Normal)   Pulse 66   Temp 97.8 F (36.6 C) (Oral)   Resp 16   Wt 149 lb 12.8 oz (67.9 kg)   SpO2 98%   BMI 28.77 kg/m   HENT:  Head: Normocephalic and atraumatic.  Right Ear: Hearing, tympanic membrane, external ear and ear canal normal.  Left Ear: Hearing, tympanic membrane, external ear and ear canal normal.  Nose: Nose normal. Right sinus exhibits no maxillary sinus tenderness  and no frontal sinus tenderness. Left sinus exhibits no maxillary sinus tenderness and no frontal sinus tenderness.  Mouth/Throat: Uvula is midline, oropharynx is clear and moist and mucous membranes are normal. No oral lesions. No uvula swelling.  While she denied any URI-type symptoms, she was noted to sniffle repeatedly during the interview and exam.  Eyes: Conjunctivae, EOM and lids are normal. Pupils are equal, round, and reactive to light. No scleral icterus.  Fundoscopic exam:      The right eye shows no hemorrhage and no papilledema. The right eye shows red reflex.       The left eye shows no hemorrhage and no papilledema.  The left eye shows red reflex.  Neck: Normal range of motion. Neck supple. No thyromegaly present.  Cardiovascular: Normal rate, regular rhythm and normal heart sounds.   Pulses:      Radial pulses are 2+ on the right side, and 2+ on the left side.  Pulmonary/Chest: Effort normal and breath sounds normal.  Lymphadenopathy:       Head (right side): No tonsillar, no preauricular, no posterior auricular and no occipital adenopathy present.       Head (left side): No tonsillar, no preauricular, no posterior auricular and no occipital adenopathy present.    She has no cervical adenopathy.       Right: No supraclavicular adenopathy present.       Left: No supraclavicular adenopathy present.  Neurological: She is alert and oriented to person, place, and time. She has normal strength. No cranial nerve deficit or sensory deficit.  Skin: Skin is warm, dry and intact. No rash noted. No cyanosis or erythema. Nails show no clubbing.  Psychiatric: She has a normal mood and affect. Her speech is normal and behavior is normal.           Assessment & Plan:   1. Essential hypertension Controlled here. Suspect elevation was due to worry that it may be causing the swimmy headed feeling.  2. Intermittent lightheadedness Likely due to residual URI symptoms.  3. Acute rhinitis, unspecified type Supportive care. Anticipatory guidance. - loratadine (CLARITIN) 10 MG tablet; Take 1 tablet (10 mg total) by mouth daily.  Dispense: 30 tablet; Refill: 0 - Azelastine HCl 0.15 % SOLN; Place 2 sprays into both nostrils 2 (two) times daily.  Dispense: 30 mL; Refill: 0  4. Osteopenia, unspecified location Needs refill of alendronate. - alendronate (FOSAMAX) 70 MG tablet; TAKE ONE TABLET BY MOUTH ONCE A WEEK WITH A FULL GLASS OF WATER ON AN EMPTY STOMACH  Dispense: 12 tablet; Refill: 3  5. Need for influenza vaccination - Flu Vaccine QUAD 36+ mos IM - Care order/instruction:   Fernande Bras,  PA-C Physician Assistant-Certified [facility]

## 2016-06-25 NOTE — Patient Instructions (Signed)
     IF you received an x-ray today, you will receive an invoice from [facility]. Please contact [facility] at [phone] with questions or concerns regarding your invoice.   IF you received labwork today, you will receive an invoice from [facility]. Please contact [facility] at [phone] with questions or concerns regarding your invoice.   Our billing staff will not be able to assist you with questions regarding bills from these companies.  You will be contacted with the lab results as soon as they are available. The fastest way to get your results is to activate your My Chart account. Instructions are located on the last page of this paperwork. If you have not heard from us regarding the results in 2 weeks, please contact this office.      

## 2016-06-28 ENCOUNTER — Encounter (HOSPITAL_COMMUNITY): Payer: Self-pay

## 2016-06-28 ENCOUNTER — Other Ambulatory Visit (HOSPITAL_COMMUNITY): Payer: Medicare HMO

## 2016-06-28 ENCOUNTER — Emergency Department (HOSPITAL_COMMUNITY): Payer: Medicare HMO

## 2016-06-28 ENCOUNTER — Emergency Department (HOSPITAL_COMMUNITY)
Admission: EM | Admit: 2016-06-28 | Discharge: 2016-06-28 | Disposition: A | Discharge status: Home / Self Care | Payer: Medicare HMO | Attending: Emergency Medicine | Admitting: Emergency Medicine

## 2016-06-28 DIAGNOSIS — R791 Abnormal coagulation profile: Secondary | ICD-10-CM | POA: Insufficient documentation

## 2016-06-28 DIAGNOSIS — R05 Cough: Secondary | ICD-10-CM | POA: Diagnosis not present

## 2016-06-28 DIAGNOSIS — R42 Dizziness and giddiness: Secondary | ICD-10-CM | POA: Diagnosis not present

## 2016-06-28 DIAGNOSIS — R69 Illness, unspecified: Secondary | ICD-10-CM | POA: Diagnosis not present

## 2016-06-28 DIAGNOSIS — I10 Essential (primary) hypertension: Secondary | ICD-10-CM | POA: Insufficient documentation

## 2016-06-28 DIAGNOSIS — F17210 Nicotine dependence, cigarettes, uncomplicated: Secondary | ICD-10-CM | POA: Diagnosis not present

## 2016-06-28 DIAGNOSIS — Z7982 Long term (current) use of aspirin: Secondary | ICD-10-CM | POA: Diagnosis not present

## 2016-06-28 LAB — COMPREHENSIVE METABOLIC PANEL
ALK PHOS: 61 U/L (ref 38–126)
ALT: 23 U/L (ref 14–54)
AST: 31 U/L (ref 15–41)
Albumin: 4.2 g/dL (ref 3.5–5.0)
Anion gap: 8 (ref 5–15)
BUN: 17 mg/dL (ref 6–20)
CALCIUM: 9.1 mg/dL (ref 8.9–10.3)
CO2: 25 mmol/L (ref 22–32)
CREATININE: 1.01 mg/dL — AB (ref 0.44–1.00)
Chloride: 110 mmol/L (ref 101–111)
GFR, EST NON AFRICAN AMERICAN: 56 mL/min — AB (ref >60)
Glucose, Bld: 84 mg/dL (ref 65–99)
Potassium: 3.9 mmol/L (ref 3.5–5.1)
Sodium: 143 mmol/L (ref 135–145)
Total Bilirubin: 0.6 mg/dL (ref 0.3–1.2)
Total Protein: 7.8 g/dL (ref 6.5–8.1)

## 2016-06-28 LAB — CBC WITH DIFFERENTIAL/PLATELET
Basophils Absolute: 0.1 10*3/uL (ref 0.0–0.1)
Basophils Relative: 1 %
EOS PCT: 6 %
Eosinophils Absolute: 0.4 10*3/uL (ref 0.0–0.7)
HEMATOCRIT: 37.5 % (ref 36.0–46.0)
HEMOGLOBIN: 12.7 g/dL (ref 12.0–15.0)
LYMPHS ABS: 2.5 10*3/uL (ref 0.7–4.0)
LYMPHS PCT: 35 %
MCH: 32.3 pg (ref 26.0–34.0)
MCHC: 33.9 g/dL (ref 30.0–36.0)
MCV: 95.4 fL (ref 78.0–100.0)
Monocytes Absolute: 0.6 10*3/uL (ref 0.1–1.0)
Monocytes Relative: 8 %
NEUTROS ABS: 3.6 10*3/uL (ref 1.7–7.7)
NEUTROS PCT: 50 %
Platelets: 309 10*3/uL (ref 150–400)
RBC: 3.93 MIL/uL (ref 3.87–5.11)
RDW: 13.7 % (ref 11.5–15.5)
WBC: 7.1 10*3/uL (ref 4.0–10.5)

## 2016-06-28 LAB — URINALYSIS, ROUTINE W REFLEX MICROSCOPIC
BILIRUBIN URINE: NEGATIVE
Glucose, UA: NEGATIVE mg/dL
HGB URINE DIPSTICK: NEGATIVE
KETONES UR: NEGATIVE mg/dL
Leukocytes, UA: NEGATIVE
NITRITE: NEGATIVE
PH: 7 (ref 5.0–8.0)
Protein, ur: NEGATIVE mg/dL
Specific Gravity, Urine: 1.013 (ref 1.005–1.030)

## 2016-06-28 LAB — PROTIME-INR
INR: 0.9
PROTHROMBIN TIME: 12.1 s (ref 11.4–15.2)

## 2016-06-28 LAB — I-STAT CG4 LACTIC ACID, ED: Lactic Acid, Venous: 0.46 mmol/L — ABNORMAL LOW (ref 0.5–1.9)

## 2016-06-28 LAB — LIPASE, BLOOD: Lipase: 28 U/L (ref 11–51)

## 2016-06-28 MED ORDER — ONDANSETRON HCL 4 MG/2ML IJ SOLN
4.0000 mg | Freq: Once | INTRAMUSCULAR | Status: AC
  Administered 2016-06-28: 4 mg via INTRAVENOUS
  Filled 2016-06-28: qty 2

## 2016-06-28 MED ORDER — SODIUM CHLORIDE 0.9 % IV BOLUS (SEPSIS)
1000.0000 mL | Freq: Once | INTRAVENOUS | Status: AC
  Administered 2016-06-28: 1000 mL via INTRAVENOUS

## 2016-06-28 NOTE — Discharge Instructions (Signed)
Please follow-up with your PCP for further management and evaluation of your dizziness and lightheadedness. If any symptoms worsen or you begin having symptoms of room spinning, vision changes, headache, nausea, vomiting, weakness, numbness, tingling, trouble walking, please return to the nearest emergency.  Please stay hydrated as this helped today.

## 2016-06-28 NOTE — ED Provider Notes (Signed)
[facility] DEPT Provider Note   CSN: [id number] Arrival date & time: 06/28/16  [id number]    History   Chief Complaint Chief Complaint  Patient presents with  . Dizziness    HPI Brandi Harrington is a 70 y.o. female  with a past medical history significant for hypertension, hyperlipidemia, TMJ syndrome, and hepatitis C who presents for dizziness and tinnitus for the last 3 days. Patient reports that she was feeling somewhat lightheaded 4 days ago upon her to go to her PCP. At that visit, patient was given a flu shot. Patient said that after leaving, she had worsening of symptoms including worsened head spinning, development of ringing in her right ear, and some difficulty with ambulation due to the spinning sensation. She reports several episodes of nausea and vomiting on Friday that have slightly improved. Patient denies any history of tinnitus or dizziness episodes. She denies any history of stroke. She does smoke. She denies any recent fevers, chills, chest pain, shortness of breath, abdominal pain, constipation, diarrhea, dysuria. She does report a mild cough. Recent history of neck trauma, neck manipulation, or neck pain.   The history is provided by the patient, a relative and medical records. No language interpreter was used.  Dizziness  Quality:  Head spinning and lightheadedness Severity:  Moderate Onset quality:  Gradual Duration:  4 days Timing:  Intermittent Progression:  Waxing and waning Chronicity:  New Context: head movement and standing up   Relieved by:  Nothing Worsened by:  Movement Ineffective treatments:  None tried Associated symptoms: nausea, tinnitus and vomiting   Associated symptoms: no blood in stool, no chest pain, no diarrhea, no headaches, no palpitations, no shortness of breath, no syncope, no vision changes and no weakness   Nausea:    Severity:  Mild   Timing:  Intermittent Vomiting:    Quality:  Stomach contents   Number of occurrences:  3   Severity:   Mild   Duration:  2 days   Timing:  Intermittent   Progression:  Improving Risk factors: no hx of stroke     Past Medical History:  Diagnosis Date  . Hyperlipidemia   . Hypertension   . Osteopenia   . TMJ syndrome   . Vitamin D deficiency     Patient Active Problem List   Diagnosis Date Noted  . BMI 28.0-28.9,adult 06/25/2016  . Facial paresthesia 02/26/2015  . Lipoma of back 02/26/2015  . Tobacco use 01/18/2015  . Hepatitis C antibody test positive 02/15/2013  . HTN (hypertension) 05/12/2012  . Hyperlipidemia 05/12/2012  . Osteopenia   . TMJ syndrome   . Vitamin D deficiency     Past Surgical History:  Procedure Laterality Date  . TUBAL LIGATION      OB History    No data available       Home Medications    Prior to Admission medications   Medication Sig Start Date End Date Taking? Authorizing Provider  alendronate (FOSAMAX) 70 MG tablet TAKE ONE TABLET BY MOUTH ONCE A WEEK WITH A FULL GLASS OF WATER ON AN EMPTY STOMACH 06/25/16   Chelle Jeffery, PA-C  amLODipine (NORVASC) 10 MG tablet Take 1 tablet (10 mg total) by mouth daily. 03/03/16   Chelle Jeffery, PA-C  aspirin 81 MG tablet Take 81 mg by mouth daily.    Historical Provider, MD  Azelastine HCl 0.15 % SOLN Place 2 sprays into both nostrils 2 (two) times daily. 06/25/16   Chelle Jeffery, PA-C  CALCIUM PO Take  by mouth daily.    Historical Provider, MD  cholecalciferol (VITAMIN D) 1000 UNITS tablet Take 1,000 Units by mouth daily.    Historical Provider, MD  clobetasol cream (TEMOVATE) 0.05 % Apply topically 2 (two) times daily. Patient not taking: Reported on 06/25/2016 06/18/15   Harrison Mons, PA-C  fish oil-omega-3 fatty acids 1000 MG capsule Take 2 g by mouth daily.    Historical Provider, MD  loratadine (CLARITIN) 10 MG tablet Take 1 tablet (10 mg total) by mouth daily. 06/25/16   Chelle Jeffery, PA-C  lovastatin (MEVACOR) 40 MG tablet Take 1 tablet (40 mg total) by mouth daily. 03/03/16   Chelle Jeffery,  PA-C  meloxicam (MOBIC) 7.5 MG tablet Take 1 tablet (7.5 mg total) by mouth daily. 03/03/16   Chelle Jeffery, PA-C  Multiple Vitamins-Minerals (CENTRUM SILVER PO) Take by mouth.    Historical Provider, MD    Family History Family History  Problem Relation Age of Onset  . Diabetes Sister   . Scoliosis Sister   . Arthritis Sister   . Alcohol abuse Daughter   . Heart disease Son     dysrrhythmia    Social History Social History  Substance Use Topics  . Smoking status: Current Some Day Smoker    Packs/day: 0.30    Types: Cigarettes  . Smokeless tobacco: Never Used     Comment: smokes a few cigarettes after work when she drinks a beer  . Alcohol use 0.0 - 1.5 oz/week     Comment: socially     Allergies   Review of patient's allergies indicates no known allergies.   Review of Systems Review of Systems  Constitutional: Negative for appetite change, chills, diaphoresis, fatigue and fever.  HENT: Positive for tinnitus. Negative for congestion and nosebleeds.   Eyes: Negative for photophobia and visual disturbance.  Respiratory: Negative for cough, choking, chest tightness, shortness of breath, wheezing and stridor.   Cardiovascular: Negative for chest pain, palpitations and syncope.  Gastrointestinal: Positive for nausea and vomiting. Negative for abdominal pain, blood in stool and diarrhea.  Genitourinary: Negative for dysuria and flank pain.  Musculoskeletal: Negative for back pain, neck pain and neck stiffness.  Skin: Negative for wound.  Neurological: Positive for dizziness and light-headedness. Negative for tremors, syncope, speech difficulty, weakness, numbness and headaches.  Psychiatric/Behavioral: Negative for agitation.  All other systems reviewed and are negative.    Physical Exam Updated Vital Signs BP (!) 158/102 (BP Location: Left Arm)   Pulse 70   Temp 97.5 F (36.4 C)   Resp 16   Ht 5\' 1"  (1.549 m)   Wt 149 lb (67.6 kg)   SpO2 100%   BMI 28.15 kg/m     Physical Exam  Constitutional: She is oriented to person, place, and time. She appears well-developed and well-nourished. No distress.  HENT:  Head: Normocephalic and atraumatic.  Mouth/Throat: Oropharynx is clear and moist. No oropharyngeal exudate.  Eyes: Conjunctivae and EOM are normal. Pupils are equal, round, and reactive to light.  Neck: Normal range of motion. Neck supple.  Cardiovascular: Normal rate and regular rhythm.   No murmur heard. Pulmonary/Chest: Effort normal and breath sounds normal. No stridor. No respiratory distress. She has no wheezes. She exhibits no tenderness.  Abdominal: Soft. There is no tenderness.  Musculoskeletal: She exhibits no edema or tenderness.  Neurological: She is alert and oriented to person, place, and time. She has normal reflexes. She is not disoriented. She displays normal reflexes. No cranial nerve deficit or sensory  deficit. She exhibits normal muscle tone. Coordination and gait normal. GCS eye subscore is 4. GCS verbal subscore is 5. GCS motor subscore is 6.  Skin: Skin is warm and dry. Capillary refill takes less than 2 seconds. She is not diaphoretic.  Psychiatric: She has a normal mood and affect.  Nursing note and vitals reviewed.    ED Treatments / Results  Labs (all labs ordered are listed, but only abnormal results are displayed) Labs Reviewed  COMPREHENSIVE METABOLIC PANEL - Abnormal; Notable for the following:       Result Value   Creatinine, Ser 1.01 (*)    GFR calc non Af Amer 56 (*)    All other components within normal limits  I-STAT CG4 LACTIC ACID, ED - Abnormal; Notable for the following:    Lactic Acid, Venous 0.46 (*)    All other components within normal limits  CBC WITH DIFFERENTIAL/PLATELET  URINALYSIS, ROUTINE W REFLEX MICROSCOPIC (NOT AT [facility])  PROTIME-INR  LIPASE, BLOOD    EKG  EKG Interpretation None       Radiology Dg Chest 2 View  Result Date: 06/28/2016 CLINICAL DATA:  Cough and dizziness.   Hypertension. EXAM: CHEST  2 VIEW COMPARISON:  Feb 18, 2011 FINDINGS: There is no edema or consolidation. Heart size and pulmonary vascularity are normal. No adenopathy. There is upper thoracic levoscoliosis. IMPRESSION: No edema or consolidation. Electronically Signed   By: Lowella Grip III M.D.   On: 06/28/2016 10:48   Ct Head Wo Contrast  Result Date: 06/28/2016 CLINICAL DATA:  Dizziness and abnormal sound in the right ear. EXAM: CT HEAD WITHOUT CONTRAST TECHNIQUE: Contiguous axial images were obtained from the base of the skull through the vertex without intravenous contrast. COMPARISON:  MRI 10/31/2004 FINDINGS: Brain: No evidence of malformation, atrophy, old or acute small or large vessel infarction, mass lesion, hemorrhage, hydrocephalus or extra-axial collection. No evidence of pituitary lesion. Vascular: There is atherosclerotic calcification of the major vessels at the base of the brain. Skull: Normal.  No fracture or focal bone lesion. Sinuses/Orbits: Visualized sinuses are clear. No fluid in the middle ears or mastoids. Visualized orbits are normal. Other: None significant IMPRESSION: Normal head CT except for calcification within the major vessels at the base of the brain, common at this age. Electronically Signed   By: Nelson Chimes M.D.   On: 06/28/2016 11:23    Procedures Procedures (including critical care time)  Medications Ordered in ED Medications  ondansetron (ZOFRAN) injection 4 mg (4 mg Intravenous Given 06/28/16 1134)  sodium chloride 0.9 % bolus 1,000 mL (0 mLs Intravenous Stopped 06/28/16 1556)     Initial Impression / Assessment and Plan / ED Course  I have reviewed the triage vital signs and the nursing notes.  Pertinent labs & imaging results that were available during my care of the patient were reviewed by me and considered in my medical decision making (see chart for details).  Clinical Course  Value Comment By Time  CT Head Wo Contrast (Reviewed)  Gwenyth Allegra Sona Nations, MD 10/08 1827    Brandi Harrington is a 70 y.o. female  with a past medical history significant for hypertension, hyperlipidemia, TMJ syndrome, and hepatitis C who presents for dizziness and tinnitus for the last 3 days.   History and exam are seen above. Patient's physical exam significant for worsening dizziness with head movement. Normal coordination on finger-nose-finger. Normal strength, sensation, and reflexes. Normal extraocular movements. Normal vision with no diplopia or blurred vision.  Given patient's age and risk factors, stroke is considered as etiology of dizziness. Patient also describes a component of lightheadedness and dehydration versus occult infection are also considered. Doubt meningitis given lack of nuchal rigidity or pain. With no recent manipulation of the neck, doubt vertebral artery dissection. Patient had mild cough so will obtain x-ray and labs for occult infection. Patient will also have CT of the head to look for abnormality.  Workup results are seen above.  Lab testing showed no acute abnormalities including no electron abnormalities or signs of infection. Her and showed no UTI. CT showed no evidence of acute intracranial abnormality and no hemorrhage. Chest x-ray showed no edema or consolidation.  Patient felt much better after fluids.  Reassured that her symptoms may be related to dehydration however, given the possibility of dizziness due to stroke, MRI was recommended. MRI was ordered however, due to delays in image collection, patient decided she was ready to be discharged.  She was ambulated by me without difficulty with no dizziness.  Given resolution of symptoms, patient was also counseled that this may be a TIA however, she felt comfortable going home and following up with her PCP for further outpatient management. She was given strict return precautions for any new or worsening symptoms including those of stroke. She and her family  understood risks of missing stroke at this time and felt comfortable going home. They had no other questions or concerns and were discharged in good condition.   Final Clinical Impressions(s) / ED Diagnoses   Final diagnoses:  Dizziness  Lightheadedness    New Prescriptions Discharge Medication List as of 06/28/2016  4:56 PM      Clinical Impression: 1. Dizziness   2. Lightheadedness     Disposition: Discharge  Condition: Good  I have discussed the results, Dx and Tx plan with the pt(& family if present). He/she/they expressed understanding and agree(s) with the plan. Discharge instructions discussed at great length. Strict return precautions discussed and pt &/or family have verbalized understanding of the instructions. No further questions at time of discharge.    Discharge Medication List as of 06/28/2016  4:56 PM      Follow Up: Harrison Mons, PA-C [address] [place] [id number] [phone]  Schedule an appointment as soon as possible for a visit    [facility] [place] [id number] [place] [phone]  If symptoms worsen     Courtney Paris, MD 06/28/16 [phone]

## 2016-06-28 NOTE — ED Triage Notes (Signed)
Patient reports that she has had dizziness and right ear "hissing" since receiving the flu shot 3 days.

## 2016-06-28 NOTE — ED Notes (Signed)
RN will collect labs at start of IV

## 2016-06-29 ENCOUNTER — Emergency Department (HOSPITAL_COMMUNITY)
Admission: EM | Admit: 2016-06-29 | Discharge: 2016-06-30 | Disposition: A | Discharge status: Home / Self Care | Payer: Medicare HMO | Attending: Emergency Medicine | Admitting: Emergency Medicine

## 2016-06-29 ENCOUNTER — Encounter (HOSPITAL_COMMUNITY): Payer: Self-pay | Admitting: Emergency Medicine

## 2016-06-29 DIAGNOSIS — H9311 Tinnitus, right ear: Secondary | ICD-10-CM

## 2016-06-29 DIAGNOSIS — Z79899 Other long term (current) drug therapy: Secondary | ICD-10-CM | POA: Diagnosis not present

## 2016-06-29 DIAGNOSIS — Z7982 Long term (current) use of aspirin: Secondary | ICD-10-CM | POA: Insufficient documentation

## 2016-06-29 DIAGNOSIS — I10 Essential (primary) hypertension: Secondary | ICD-10-CM | POA: Diagnosis not present

## 2016-06-29 DIAGNOSIS — F17210 Nicotine dependence, cigarettes, uncomplicated: Secondary | ICD-10-CM | POA: Insufficient documentation

## 2016-06-29 DIAGNOSIS — H81399 Other peripheral vertigo, unspecified ear: Secondary | ICD-10-CM | POA: Diagnosis not present

## 2016-06-29 DIAGNOSIS — R69 Illness, unspecified: Secondary | ICD-10-CM | POA: Diagnosis not present

## 2016-06-29 MED ORDER — MECLIZINE HCL 25 MG PO TABS
25.0000 mg | ORAL_TABLET | Freq: Once | ORAL | Status: AC
  Administered 2016-06-29: 25 mg via ORAL
  Filled 2016-06-29: qty 1

## 2016-06-29 NOTE — ED Notes (Signed)
Triage RN notified of patient's vital change.

## 2016-06-29 NOTE — ED Provider Notes (Signed)
[facility] DEPT Provider Note   CSN: [id number] Arrival date & time: 06/29/16  1749  By signing my name below, I, Dora Sims, attest that this documentation has been prepared under the direction and in the presence of physician practitioner, Delora Fuel, MD. Electronically Signed: Dora Sims, Scribe. 06/29/2016. 11:38 PM.  History   Chief Complaint Chief Complaint  Patient presents with  . Dizziness  . roaring in ear    The history is provided by the patient. No language interpreter was used.     HPI Comments: Brandi Harrington is a 70 y.o. female who presents to the Emergency Department complaining of sudden onset, constant, gradually improving, dizziness beginning 3 days ago. Pt reports she experiences dizziness upon standing and reports good relief of her dizziness with laying down. She notes she was seen here yesterday for the same reason but was not prescribed any medications. She states she does not feel like the room is spinning, but she does feel off balance upon standing. Pt reports her dizziness presented shortly after receiving a flu shot on 06/26/16. She notes she has had to crawl several times due to dizziness but has not fallen. She also notes an associated "wave-like, roaring" sensation in her right ear. She states she has never experienced similar symptoms in the past. Pt has not tried any treatments for her symptoms. She denies nausea, headache, right ear pain, left ear issues, or any other associated symptoms.  PCP: Harrison Mons, PA-C.  Past Medical History:  Diagnosis Date  . Hyperlipidemia   . Hypertension   . Osteopenia   . TMJ syndrome   . Vitamin D deficiency     Patient Active Problem List   Diagnosis Date Noted  . BMI 28.0-28.9,adult 06/25/2016  . Facial paresthesia 02/26/2015  . Lipoma of back 02/26/2015  . Tobacco use 01/18/2015  . Hepatitis C antibody test positive 02/15/2013  . HTN (hypertension) 05/12/2012  . Hyperlipidemia 05/12/2012  .  Osteopenia   . TMJ syndrome   . Vitamin D deficiency     Past Surgical History:  Procedure Laterality Date  . TUBAL LIGATION      OB History    No data available       Home Medications    Prior to Admission medications   Medication Sig Start Date End Date Taking? Authorizing Provider  alendronate (FOSAMAX) 70 MG tablet TAKE ONE TABLET BY MOUTH ONCE A WEEK WITH A FULL GLASS OF WATER ON AN EMPTY STOMACH 06/25/16   Chelle Jeffery, PA-C  amLODipine (NORVASC) 10 MG tablet Take 1 tablet (10 mg total) by mouth daily. 03/03/16   Chelle Jeffery, PA-C  aspirin 81 MG tablet Take 81 mg by mouth daily.    Historical Provider, MD  Azelastine HCl 0.15 % SOLN Place 2 sprays into both nostrils 2 (two) times daily. 06/25/16   Chelle Jeffery, PA-C  cholecalciferol (VITAMIN D) 1000 UNITS tablet Take 1,000 Units by mouth daily.    Historical Provider, MD  clobetasol cream (TEMOVATE) 0.05 % Apply topically 2 (two) times daily. Patient not taking: Reported on 06/25/2016 06/18/15   Harrison Mons, PA-C  fish oil-omega-3 fatty acids 1000 MG capsule Take 2 g by mouth daily.    Historical Provider, MD  loratadine (CLARITIN) 10 MG tablet Take 1 tablet (10 mg total) by mouth daily. 06/25/16   Chelle Jeffery, PA-C  lovastatin (MEVACOR) 40 MG tablet Take 1 tablet (40 mg total) by mouth daily. 03/03/16   Chelle Jeffery, PA-C  meloxicam (MOBIC)  7.5 MG tablet Take 1 tablet (7.5 mg total) by mouth daily. 03/03/16   Chelle Jeffery, PA-C  Multiple Vitamins-Minerals (CENTRUM SILVER PO) Take by mouth.    Historical Provider, MD    Family History Family History  Problem Relation Age of Onset  . Diabetes Sister   . Scoliosis Sister   . Arthritis Sister   . Alcohol abuse Daughter   . Heart disease Son     dysrrhythmia    Social History Social History  Substance Use Topics  . Smoking status: Current Some Day Smoker    Packs/day: 0.30    Types: Cigarettes  . Smokeless tobacco: Never Used     Comment: smokes a few  cigarettes after work when she drinks a beer  . Alcohol use 0.0 - 1.5 oz/week     Comment: socially     Allergies   Review of patient's allergies indicates no known allergies.   Review of Systems Review of Systems  HENT: Negative for ear pain.   Gastrointestinal: Negative for nausea.  Neurological: Positive for dizziness (upon standing). Negative for headaches.  All other systems reviewed and are negative.   Physical Exam Updated Vital Signs BP 146/97   Pulse (!) 58   Temp 97.9 F (36.6 C) (Oral)   Resp 18   Ht 5\' 2"  (1.575 m)   Wt 151 lb 9 oz (68.7 kg)   SpO2 100%   BMI 27.72 kg/m   Physical Exam  Constitutional: She is oriented to person, place, and time. She appears well-developed and well-nourished.  HENT:  Head: Normocephalic and atraumatic.  Eyes: EOM are normal. Pupils are equal, round, and reactive to light.  Neck: Normal range of motion. Neck supple. No JVD present.  Faint right carotid bruit  Cardiovascular: Normal rate, regular rhythm and normal heart sounds.   No murmur heard. Pulmonary/Chest: Effort normal and breath sounds normal. She has no wheezes. She has no rales. She exhibits no tenderness.  Abdominal: Soft. Bowel sounds are normal. She exhibits no distension and no mass. There is no tenderness.  Musculoskeletal: Normal range of motion. She exhibits no edema.  Lymphadenopathy:    She has no cervical adenopathy.  Neurological: She is alert and oriented to person, place, and time. No cranial nerve deficit. She exhibits normal muscle tone. Coordination normal.  Dizziness reproduced by passive head movement. Romberg generally unsteady but does not fall in any one direction consistently.  Skin: Skin is warm and dry. No rash noted.  Psychiatric: She has a normal mood and affect. Her behavior is normal. Judgment and thought content normal.  Nursing note and vitals reviewed.   ED Treatments / Results   Procedures Procedures (including critical care  time)  DIAGNOSTIC STUDIES: Oxygen Saturation is 100% on RA, normal by my interpretation.    COORDINATION OF CARE: 11:49 PM Discussed treatment plan with pt at bedside and pt agreed to plan.  Medications Ordered in ED Medications  meclizine (ANTIVERT) tablet 25 mg (not administered)     Initial Impression / Assessment and Plan / ED Course  I have reviewed the triage vital signs and the nursing notes.  Pertinent labs & imaging results that were available during my care of the patient were reviewed by me and considered in my medical decision making (see chart for details).  Clinical Course   Mode ago which appears to be peripheral vertigo. Old records are reviewed and she was seen in the ED last night and did have a negative CT scan.  CT is not good for visualizing posterior fossa, but symptoms have not worsened. Full laboratory workup was done yesterday and there is no indication to repeat anything. She is given a dose of meclizine with substantial improvement. There is also a carotid bruit present and I'm concerned that her tinnitus may be related to the bruit. She is advised to have her PCP ordered carotid duplex scan. Consider ENT referral if the carotid duplex scan is negative. She is discharged with prescription for meclizine.  I personally performed the services described in this documentation, which was scribed in my presence. The recorded information has been reviewed and is accurate.     Final Clinical Impressions(s) / ED Diagnoses   Final diagnoses:  Peripheral vertigo, unspecified laterality  Tinnitus of right ear    New Prescriptions New Prescriptions   MECLIZINE (ANTIVERT) 25 MG TABLET    Take 1 tablet (25 mg total) by mouth 3 (three) times daily as needed for dizziness.     Delora Fuel, MD [id number]

## 2016-06-29 NOTE — ED Triage Notes (Signed)
Patient states that she still having dizziness and roaring in ears.  Patient states that symptoms started last Wed when she got flu shot. patient states that she was seen here yesterday.

## 2016-06-30 ENCOUNTER — Telehealth: Payer: Self-pay | Admitting: Physician Assistant

## 2016-06-30 DIAGNOSIS — R0989 Other specified symptoms and signs involving the circulatory and respiratory systems: Secondary | ICD-10-CM

## 2016-06-30 MED ORDER — MECLIZINE HCL 25 MG PO TABS: 25.0000 mg | ORAL_TABLET | Freq: Three times a day (TID) | ORAL | 0 refills | Status: DC | PRN

## 2016-06-30 NOTE — Discharge Instructions (Signed)
Talk with your doctor about scheduling a carotid duplex scan to see if the carotid artery is narrowed. Consider getting a referral to an ENT doctor to evaluate the roaring in your ear.

## 2016-06-30 NOTE — Telephone Encounter (Signed)
Please advise this patient that I have received the notes from her recent visits in the ED with the persistent dizziness. I am ordering the [place] of her carotid arteries, as recommended.

## 2016-07-01 NOTE — Telephone Encounter (Signed)
Left VM with facility to schedule on 10/10 due to no answer. Either I or they will call pt once an apt is made.

## 2016-07-01 NOTE — Telephone Encounter (Signed)
Attempted to call patient and phone # is disconnected.

## 2016-07-02 NOTE — Telephone Encounter (Signed)
Please try reaching her daughter or other emergency contact. If unable to reach, please send her a letter asking her to contact us.

## 2016-07-03 ENCOUNTER — Ambulatory Visit (INDEPENDENT_AMBULATORY_CARE_PROVIDER_SITE_OTHER): Payer: Medicare HMO | Admitting: Physician Assistant

## 2016-07-03 VITALS — BP 150/90 | HR 73 | Temp 98.3°F | Resp 17 | Ht 62.0 in | Wt 151.0 lb

## 2016-07-03 DIAGNOSIS — H9311 Tinnitus, right ear: Secondary | ICD-10-CM | POA: Diagnosis not present

## 2016-07-03 DIAGNOSIS — R0989 Other specified symptoms and signs involving the circulatory and respiratory systems: Secondary | ICD-10-CM

## 2016-07-03 DIAGNOSIS — R42 Dizziness and giddiness: Secondary | ICD-10-CM | POA: Diagnosis not present

## 2016-07-03 NOTE — Patient Instructions (Signed)
     IF you received an x-ray today, you will receive an invoice from [facility]. Please contact [facility] at [phone] with questions or concerns regarding your invoice.   IF you received labwork today, you will receive an invoice from [facility]. Please contact [facility] at [phone] with questions or concerns regarding your invoice.   Our billing staff will not be able to assist you with questions regarding bills from these companies.  You will be contacted with the lab results as soon as they are available. The fastest way to get your results is to activate your My Chart account. Instructions are located on the last page of this paperwork. If you have not heard from us regarding the results in 2 weeks, please contact this office.      

## 2016-07-03 NOTE — Telephone Encounter (Signed)
Attempted to get pt - number discontinued. Attempted to call [facility] - unable to leave message Attempted to call son - line continuously busy  Sent letter.

## 2016-07-03 NOTE — Progress Notes (Signed)
Patient ID: Brandi Harrington, female    DOB: [birth year], 70 y.o.   MRN: [id number]  PCP: Porfirio Oar, PA-C  Subjective:   Chief Complaint  Patient presents with  . Follow-up    Hospital  . Tinnitus    Rt onset 1 week    HPI Presents for ED follow-up.  I saw her on 10/05 with light-headedness, concerned that her BP was elevated, accompanied by her daughter. Her vitals were normal in the office and she was reassured. She was given a seasonal flu vaccine.  The following day, she developed dizziness, nausea/vomiting and tinnitus. After two days of that, she presented to the ED on 06/28/2016.  BP there was 158/102. CT head was normal. Urine revealed no infection. CXR showed no infiltrate. Electrolytes were normal. MRI was recommended, but due to delays in image collection, and because she was feeling better, was discharged home.  She returned to the ED the following day with roaring in the ear. Dizziness was resolved with lying down. Nausea was resolved. No ear pain. A faint RIGHT carotid bruit was noted and dizziness was reproduced with passive head movement. Rhomberg was generally unsteady, but she did not fall consistently in any one direction. BP was 146/98. Carotid duplex was recommended, with ENT referral if scan negative. She was discharged with meclizine and instructions to follow-up here. I ordered the carotid US, but was unable to reach the patient by phone. An unable to reach letter was put in the mail to the patient just this morning.  The roaring is only in the RIGHT ear. It is constant and doesn't lessen when she watches TV or listens to the radio. No ear pain. No runny nose, stuffy nose, sore throat or cough. No headache, but she relates that she's been upset today, trying to arrange getting here, so feels like her head is "swimming." Was not dizzy this morning, but as she got upset today, she developed the "swimmy" feeling, as above, but overall thinks that the dizziness is  improving. She is taking the meclizine, with benefit, but without resolution of the symptoms.    Review of Systems As above. No CP, SOB. No new muscle or joint pain. No urinary urgency, frequency, burning. No vision changes. No weakness.    Patient Active Problem List   Diagnosis Date Noted  . BMI 28.0-28.9,adult 06/25/2016  . Facial paresthesia 02/26/2015  . Lipoma of back 02/26/2015  . Tobacco use 01/18/2015  . Hepatitis C antibody test positive 02/15/2013  . HTN (hypertension) 05/12/2012  . Hyperlipidemia 05/12/2012  . Osteopenia   . TMJ syndrome   . Vitamin D deficiency      Prior to Admission medications   Medication Sig Start Date End Date Taking? Authorizing Provider  alendronate (FOSAMAX) 70 MG tablet TAKE ONE TABLET BY MOUTH ONCE A WEEK WITH A FULL GLASS OF WATER ON AN EMPTY STOMACH 06/25/16  Yes Gregor Dershem, PA-C  amLODipine (NORVASC) 10 MG tablet Take 1 tablet (10 mg total) by mouth daily. 03/03/16  Yes Baylon Santelli, PA-C  aspirin 81 MG tablet Take 81 mg by mouth daily.   Yes Historical Provider, MD  Azelastine HCl 0.15 % SOLN Place 2 sprays into both nostrils 2 (two) times daily. 06/25/16  Yes Layden Caterino, PA-C  cholecalciferol (VITAMIN D) 1000 UNITS tablet Take 1,000 Units by mouth daily.   Yes Historical Provider, MD  fish oil-omega-3 fatty acids 1000 MG capsule Take 2 g by mouth daily.   Yes Historical Provider,  MD  loratadine (CLARITIN) 10 MG tablet Take 1 tablet (10 mg total) by mouth daily. 06/25/16  Yes Jonah Nestle, PA-C  lovastatin (MEVACOR) 40 MG tablet Take 1 tablet (40 mg total) by mouth daily. 03/03/16  Yes Matix Henshaw, PA-C  meclizine (ANTIVERT) 25 MG tablet Take 1 tablet (25 mg total) by mouth 3 (three) times daily as needed for dizziness. 06/30/16  Yes Dione Booze, MD  meloxicam (MOBIC) 7.5 MG tablet Take 1 tablet (7.5 mg total) by mouth daily. 03/03/16  Yes Miral Hoopes, PA-C  Multiple Vitamins-Minerals (CENTRUM SILVER PO) Take by mouth.    Yes Historical Provider, MD     No Known Allergies     Objective:  Physical Exam  Constitutional: She is oriented to person, place, and time. She appears well-developed and well-nourished. She is active and cooperative. No distress.  BP (!) 150/90 (BP Location: Right Arm, Patient Position: Sitting, Cuff Size: Normal)   Pulse 73   Temp 98.3 F (36.8 C) (Oral)   Resp 17   Ht 5\' 2"  (1.575 m)   Wt 151 lb (68.5 kg)   SpO2 99%   BMI 27.62 kg/m   HENT:  Head: Normocephalic and atraumatic.  Right Ear: Hearing normal.  Left Ear: Hearing normal.  Eyes: Conjunctivae are normal. No scleral icterus.  Neck: Normal range of motion. Neck supple. No thyromegaly present.  Cardiovascular: Normal rate, regular rhythm and normal heart sounds.   Pulses:      Radial pulses are 2+ on the right side, and 2+ on the left side.  No carotid bruit appreciated  Pulmonary/Chest: Effort normal and breath sounds normal.  Lymphadenopathy:       Head (right side): No tonsillar, no preauricular, no posterior auricular and no occipital adenopathy present.       Head (left side): No tonsillar, no preauricular, no posterior auricular and no occipital adenopathy present.    She has no cervical adenopathy.       Right: No supraclavicular adenopathy present.       Left: No supraclavicular adenopathy present.  Neurological: She is alert and oriented to person, place, and time. She has normal strength. No sensory deficit. Coordination and gait normal.  Reflex Scores:      Bicep reflexes are 2+ on the right side and 2+ on the left side.      Patellar reflexes are 2+ on the right side and 2+ on the left side.      Achilles reflexes are 2+ on the right side and 2+ on the left side. Rhomberg with consistent leaning to the back, but self-corrected.  Skin: Skin is warm, dry and intact. No rash noted. No cyanosis or erythema. Nails show no clubbing.  Psychiatric: She has a normal mood and affect. Her speech is normal and  behavior is normal.           Assessment & Plan:   1. Tinnitus of right ear - Ambulatory referral to ENT  2. Carotid bruit, unspecified laterality Proceed with carotid artery dopplers. (it turns out that her telephone number had 2 digits transposed in our records, and her son's number has changed. I updated her contact information as well as that of her emergency contacts)  3. Lightheadedness If this persists and carotid dopplers are negative, would proceed with MRI brain.   Fernande Bras, PA-C Physician Assistant-Certified [facility]

## 2016-07-04 NOTE — Progress Notes (Signed)
Sent to [facility] for scheduling!

## 2016-07-08 DIAGNOSIS — R42 Dizziness and giddiness: Secondary | ICD-10-CM | POA: Diagnosis not present

## 2016-07-08 DIAGNOSIS — H9311 Tinnitus, right ear: Secondary | ICD-10-CM | POA: Diagnosis not present

## 2016-07-10 ENCOUNTER — Ambulatory Visit (HOSPITAL_COMMUNITY)
Admission: RE | Admit: 2016-07-10 | Discharge: 2016-07-10 | Disposition: A | Discharge status: Home / Self Care | Payer: Medicare HMO | Source: Ambulatory Visit | Attending: Vascular Surgery | Admitting: Vascular Surgery

## 2016-07-10 ENCOUNTER — Other Ambulatory Visit: Payer: Self-pay | Admitting: Physician Assistant

## 2016-07-10 DIAGNOSIS — I6523 Occlusion and stenosis of bilateral carotid arteries: Secondary | ICD-10-CM | POA: Insufficient documentation

## 2016-07-10 DIAGNOSIS — R42 Dizziness and giddiness: Secondary | ICD-10-CM | POA: Diagnosis not present

## 2016-07-10 DIAGNOSIS — R0989 Other specified symptoms and signs involving the circulatory and respiratory systems: Secondary | ICD-10-CM

## 2016-08-01 DIAGNOSIS — H52209 Unspecified astigmatism, unspecified eye: Secondary | ICD-10-CM | POA: Diagnosis not present

## 2016-08-01 DIAGNOSIS — H5203 Hypermetropia, bilateral: Secondary | ICD-10-CM | POA: Diagnosis not present

## 2016-08-01 DIAGNOSIS — H524 Presbyopia: Secondary | ICD-10-CM | POA: Diagnosis not present

## 2016-08-01 DIAGNOSIS — Z0100 Encounter for examination of eyes and vision without abnormal findings: Secondary | ICD-10-CM | POA: Diagnosis not present

## 2016-09-01 ENCOUNTER — Encounter: Payer: Self-pay | Admitting: Physician Assistant

## 2016-09-01 ENCOUNTER — Ambulatory Visit (INDEPENDENT_AMBULATORY_CARE_PROVIDER_SITE_OTHER): Payer: Medicare HMO | Admitting: Physician Assistant

## 2016-09-01 VITALS — BP 130/80 | HR 76 | Temp 98.7°F | Resp 16 | Ht 60.5 in | Wt 154.4 lb

## 2016-09-01 DIAGNOSIS — E559 Vitamin D deficiency, unspecified: Secondary | ICD-10-CM | POA: Diagnosis not present

## 2016-09-01 DIAGNOSIS — M8580 Other specified disorders of bone density and structure, unspecified site: Secondary | ICD-10-CM

## 2016-09-01 DIAGNOSIS — E785 Hyperlipidemia, unspecified: Secondary | ICD-10-CM

## 2016-09-01 DIAGNOSIS — I10 Essential (primary) hypertension: Secondary | ICD-10-CM

## 2016-09-01 DIAGNOSIS — Z6828 Body mass index (BMI) 28.0-28.9, adult: Secondary | ICD-10-CM | POA: Diagnosis not present

## 2016-09-01 LAB — POCT URINALYSIS DIP (MANUAL ENTRY)
Bilirubin, UA: NEGATIVE
Blood, UA: NEGATIVE
Glucose, UA: NEGATIVE
Ketones, POC UA: NEGATIVE
LEUKOCYTES UA: NEGATIVE
NITRITE UA: NEGATIVE
PH UA: 5.5
PROTEIN UA: NEGATIVE
Spec Grav, UA: 1.015
UROBILINOGEN UA: 0.2

## 2016-09-01 NOTE — Patient Instructions (Addendum)
Being Mortal by Eda Keys, MD    IF you received an x-ray today, you will receive an invoice from [facility]. Please contact [facility] at [phone] with questions or concerns regarding your invoice.   IF you received labwork today, you will receive an invoice from [employer]. Please contact [facility] at [phone] with questions or concerns regarding your invoice.   Our billing staff will not be able to assist you with questions regarding bills from these companies.  You will be contacted with the lab results as soon as they are available. The fastest way to get your results is to activate your My Chart account. Instructions are located on the last page of this paperwork. If you have not heard from [place] regarding the results in 2 weeks, please contact this office.

## 2016-09-01 NOTE — Progress Notes (Signed)
Patient ID: Brandi Harrington, female    DOB: 1945-12-27, 70 y.o.   MRN: [id number]  PCP: Porfirio Oar, PA-C  Chief Complaint  Patient presents with  . Hypertension    Subjective:   Presents for evaluation of HTN, hyperlipidemia.  I saw her recently with dizziness thought related to her BP, but it was normal here, and I thought she had some ETD. ED evaluation did not identify other cause. Since then, the symptoms have resolved.  Has had coffee and cream today.   Review of Systems  Constitutional: Negative.   HENT: Negative for sore throat.   Eyes: Negative for visual disturbance.  Respiratory: Negative for cough, chest tightness, shortness of breath and wheezing.   Cardiovascular: Negative for chest pain and palpitations.  Gastrointestinal: Negative for abdominal pain, diarrhea, nausea and vomiting.  Genitourinary: Negative for dysuria, frequency, hematuria and urgency.  Musculoskeletal: Negative for arthralgias and myalgias.  Skin: Negative for rash.  Neurological: Negative for dizziness, weakness and headaches.  Psychiatric/Behavioral: Negative for decreased concentration. The patient is not nervous/anxious.        Patient Active Problem List   Diagnosis Date Noted  . BMI 28.0-28.9,adult 06/25/2016  . Facial paresthesia 02/26/2015  . Lipoma of back 02/26/2015  . Tobacco use 01/18/2015  . Hepatitis C antibody test positive 02/15/2013  . HTN (hypertension) 05/12/2012  . Hyperlipidemia 05/12/2012  . Osteopenia   . TMJ syndrome   . Vitamin D deficiency      Prior to Admission medications   Medication Sig Start Date End Date Taking? Authorizing Provider  alendronate (FOSAMAX) 70 MG tablet TAKE ONE TABLET BY MOUTH ONCE A WEEK WITH A FULL GLASS OF WATER ON AN EMPTY STOMACH 06/25/16   Tyera Hansley, PA-C  amLODipine (NORVASC) 10 MG tablet Take 1 tablet (10 mg total) by mouth daily. 03/03/16   Merrit Waugh, PA-C  aspirin 81 MG tablet Take 81 mg by mouth daily.     Historical Provider, MD  Azelastine HCl 0.15 % SOLN Place 2 sprays into both nostrils 2 (two) times daily. 06/25/16   Hassell Patras, PA-C  cholecalciferol (VITAMIN D) 1000 UNITS tablet Take 1,000 Units by mouth daily.    Historical Provider, MD  fish oil-omega-3 fatty acids 1000 MG capsule Take 2 g by mouth daily.    Historical Provider, MD  loratadine (CLARITIN) 10 MG tablet Take 1 tablet (10 mg total) by mouth daily. 06/25/16   Willow Shidler, PA-C  lovastatin (MEVACOR) 40 MG tablet Take 1 tablet (40 mg total) by mouth daily. 03/03/16   Nalleli Largent, PA-C  meclizine (ANTIVERT) 25 MG tablet Take 1 tablet (25 mg total) by mouth 3 (three) times daily as needed for dizziness. 06/30/16   Dione Booze, MD  meloxicam (MOBIC) 7.5 MG tablet Take 1 tablet (7.5 mg total) by mouth daily. 03/03/16   Jamontae Thwaites, PA-C  Multiple Vitamins-Minerals (CENTRUM SILVER PO) Take by mouth.    Historical Provider, MD     No Known Allergies     Objective:  Physical Exam  Constitutional: She is oriented to person, place, and time. She appears well-developed and well-nourished. She is active and cooperative. No distress.  BP 130/80 (BP Location: Left Arm, Cuff Size: Normal)   Pulse 76   Temp 98.7 F (37.1 C) (Oral)   Resp 16   Ht 5' 0.5" (1.537 m)   Wt 154 lb 6.4 oz (70 kg)   SpO2 95%   BMI 29.66 kg/m   HENT:  Head: Normocephalic  and atraumatic.  Right Ear: Hearing normal.  Left Ear: Hearing normal.  Eyes: Conjunctivae are normal. No scleral icterus.  Neck: Normal range of motion. Neck supple. No thyromegaly present.  Cardiovascular: Normal rate, regular rhythm and normal heart sounds.   Pulses:      Radial pulses are 2+ on the right side, and 2+ on the left side.  Pulmonary/Chest: Effort normal and breath sounds normal.  Lymphadenopathy:       Head (right side): No tonsillar, no preauricular, no posterior auricular and no occipital adenopathy present.       Head (left side): No tonsillar, no  preauricular, no posterior auricular and no occipital adenopathy present.    She has no cervical adenopathy.       Right: No supraclavicular adenopathy present.       Left: No supraclavicular adenopathy present.  Neurological: She is alert and oriented to person, place, and time. No sensory deficit.  Skin: Skin is warm, dry and intact. No rash noted. No cyanosis or erythema. Nails show no clubbing.  Psychiatric: She has a normal mood and affect. Her speech is normal and behavior is normal.           Assessment & Plan:   1. Essential hypertension Controlled. - Comprehensive metabolic panel - CBC with Differential/Platelet - POCT urinalysis dipstick - Urine Microscopic  2. Hyperlipidemia, unspecified hyperlipidemia type Await labs. Adjust regimen as indicated by results. - Comprehensive metabolic panel - Lipid panel  3. Vitamin D deficiency Await labs. Adjust regimen as indicated by results. - VITAMIN D 25 Hydroxy (Vit-D Deficiency, Fractures)  4. BMI 28.0-28.9,adult Healthy eating and regular exercise.  5. Osteopenia, unspecified location Continue alendronate. Update DEXA. Continue vitamin D supplementation. - DG Bone Density; Future   Return in about 6 months (around 03/02/2017) for re-evaluation of blood pressure, cholesterol.   Fernande Bras, PA-C Physician Assistant-Certified [facility]

## 2016-09-02 LAB — CBC WITH DIFFERENTIAL/PLATELET
BASOS ABS: 0 10*3/uL (ref 0.0–0.2)
BASOS: 0 %
EOS (ABSOLUTE): 0.4 10*3/uL (ref 0.0–0.4)
Eos: 6 %
Hematocrit: 37.9 % (ref 34.0–46.6)
Hemoglobin: 12.7 g/dL (ref 11.1–15.9)
Immature Grans (Abs): 0 10*3/uL (ref 0.0–0.1)
Immature Granulocytes: 0 %
LYMPHS ABS: 2.8 10*3/uL (ref 0.7–3.1)
Lymphs: 42 %
MCH: 31.9 pg (ref 26.6–33.0)
MCHC: 33.5 g/dL (ref 31.5–35.7)
MCV: 95 fL (ref 79–97)
MONOS ABS: 0.5 10*3/uL (ref 0.1–0.9)
Monocytes: 8 %
NEUTROS ABS: 3 10*3/uL (ref 1.4–7.0)
Neutrophils: 44 %
PLATELETS: 334 10*3/uL (ref 150–379)
RBC: 3.98 x10E6/uL (ref 3.77–5.28)
RDW: 14.5 % (ref 12.3–15.4)
WBC: 6.8 10*3/uL (ref 3.4–10.8)

## 2016-09-02 LAB — COMPREHENSIVE METABOLIC PANEL
A/G RATIO: 1.5 (ref 1.2–2.2)
ALT: 21 IU/L (ref 0–32)
AST: 29 IU/L (ref 0–40)
Albumin: 4.4 g/dL (ref 3.6–4.8)
Alkaline Phosphatase: 69 IU/L (ref 39–117)
BUN/Creatinine Ratio: 17 (ref 12–28)
BUN: 16 mg/dL (ref 8–27)
Bilirubin Total: <0.2 mg/dL (ref 0.0–1.2)
CALCIUM: 9.5 mg/dL (ref 8.7–10.3)
CO2: 24 mmol/L (ref 18–29)
CREATININE: 0.95 mg/dL (ref 0.57–1.00)
Chloride: 102 mmol/L (ref 96–106)
GFR, EST AFRICAN AMERICAN: 71 mL/min/{1.73_m2} (ref >59)
GFR, EST NON AFRICAN AMERICAN: 62 mL/min/{1.73_m2} (ref >59)
GLOBULIN, TOTAL: 3 g/dL (ref 1.5–4.5)
Glucose: 60 mg/dL — ABNORMAL LOW (ref 65–99)
POTASSIUM: 4.3 mmol/L (ref 3.5–5.2)
Sodium: 143 mmol/L (ref 134–144)
TOTAL PROTEIN: 7.4 g/dL (ref 6.0–8.5)

## 2016-09-02 LAB — LIPID PANEL
CHOL/HDL RATIO: 2.2 ratio (ref 0.0–4.4)
Cholesterol, Total: 212 mg/dL — ABNORMAL HIGH (ref 100–199)
HDL: 95 mg/dL (ref >39)
LDL CALC: 106 mg/dL — AB (ref 0–99)
TRIGLYCERIDES: 53 mg/dL (ref 0–149)
VLDL Cholesterol Cal: 11 mg/dL (ref 5–40)

## 2016-09-02 LAB — URINALYSIS, MICROSCOPIC ONLY: CASTS: NONE SEEN /LPF

## 2016-09-02 LAB — VITAMIN D 25 HYDROXY (VIT D DEFICIENCY, FRACTURES): VIT D 25 HYDROXY: 43.6 ng/mL (ref 30.0–100.0)

## 2016-09-05 ENCOUNTER — Encounter: Payer: Self-pay | Admitting: Physician Assistant

## 2016-09-12 ENCOUNTER — Encounter: Payer: Self-pay | Admitting: Physician Assistant

## 2016-09-12 DIAGNOSIS — H9311 Tinnitus, right ear: Secondary | ICD-10-CM | POA: Insufficient documentation

## 2016-09-14 ENCOUNTER — Encounter (HOSPITAL_COMMUNITY): Payer: Self-pay | Admitting: Emergency Medicine

## 2016-09-14 ENCOUNTER — Emergency Department (HOSPITAL_COMMUNITY)
Admission: EM | Admit: 2016-09-14 | Discharge: 2016-09-14 | Disposition: A | Discharge status: Home / Self Care | Payer: Medicare HMO | Attending: Emergency Medicine | Admitting: Emergency Medicine

## 2016-09-14 DIAGNOSIS — Z7982 Long term (current) use of aspirin: Secondary | ICD-10-CM | POA: Insufficient documentation

## 2016-09-14 DIAGNOSIS — H6691 Otitis media, unspecified, right ear: Secondary | ICD-10-CM | POA: Insufficient documentation

## 2016-09-14 DIAGNOSIS — H9201 Otalgia, right ear: Secondary | ICD-10-CM | POA: Diagnosis present

## 2016-09-14 DIAGNOSIS — F17200 Nicotine dependence, unspecified, uncomplicated: Secondary | ICD-10-CM | POA: Insufficient documentation

## 2016-09-14 DIAGNOSIS — R69 Illness, unspecified: Secondary | ICD-10-CM | POA: Diagnosis not present

## 2016-09-14 DIAGNOSIS — R42 Dizziness and giddiness: Secondary | ICD-10-CM

## 2016-09-14 HISTORY — DX: Dizziness and giddiness: R42

## 2016-09-14 MED ORDER — MECLIZINE HCL 25 MG PO TABS
25.0000 mg | ORAL_TABLET | Freq: Once | ORAL | Status: AC
  Administered 2016-09-14: 25 mg via ORAL
  Filled 2016-09-14: qty 1

## 2016-09-14 MED ORDER — PREDNISONE 20 MG PO TABS: 20.0000 mg | ORAL_TABLET | Freq: Every day | ORAL | 0 refills | Status: DC

## 2016-09-14 MED ORDER — AMOXICILLIN-POT CLAVULANATE 875-125 MG PO TABS
1.0000 | ORAL_TABLET | Freq: Once | ORAL | Status: AC
  Administered 2016-09-14: 1 via ORAL
  Filled 2016-09-14: qty 1

## 2016-09-14 MED ORDER — PREDNISONE 20 MG PO TABS
40.0000 mg | ORAL_TABLET | Freq: Once | ORAL | Status: AC
  Administered 2016-09-14: 40 mg via ORAL
  Filled 2016-09-14: qty 2

## 2016-09-14 MED ORDER — MECLIZINE HCL 25 MG PO TABS: 25.0000 mg | ORAL_TABLET | Freq: Three times a day (TID) | ORAL | 0 refills | Status: DC | PRN

## 2016-09-14 MED ORDER — AMOXICILLIN-POT CLAVULANATE 875-125 MG PO TABS: 1.0000 | ORAL_TABLET | Freq: Two times a day (BID) | ORAL | 0 refills | Status: DC

## 2016-09-14 NOTE — ED Notes (Signed)
Patient states she was her 08-30-16 and diagnosed with vertigo.  Symptoms today is very similar to those on 08-30-16.

## 2016-09-14 NOTE — ED Provider Notes (Signed)
[facility] DEPT Provider Note   CSN: [id number] Arrival date & time: 09/14/16  1024     History   Chief Complaint Chief Complaint  Patient presents with  . Dizziness  . Otalgia  . Emesis    HPI Brandi Harrington is a 70 y.o. female.  Third visit to the emergency department in December for vertigo-like symptoms. Symptoms have come and gone. She has been evaluated by a otolaryngologist and he "did not see anything wrong".  Review of systems positive for right ear pain. No neurological deficits, stiff neck, fever, chills. Vertigo described as a sense of room spinning. She has run out of her Antivert      Past Medical History:  Diagnosis Date  . Vertigo     There are no active problems to display for this patient.   History reviewed. No pertinent surgical history.  OB History    No data available       Home Medications    Prior to Admission medications   Medication Sig Start Date End Date Taking? Authorizing Provider  alendronate (FOSAMAX) 70 MG tablet Take 70 mg by mouth once a week. Take with a full glass of water on an empty stomach on Wednesday.   Yes Historical Provider, MD  amLODipine (NORVASC) 10 MG tablet Take 10 mg by mouth daily.   Yes Historical Provider, MD  aspirin EC 81 MG tablet Take 81 mg by mouth daily.   Yes Historical Provider, MD  cholecalciferol (VITAMIN D) 1000 units tablet Take 1,000 Units by mouth every evening.   Yes Historical Provider, MD  lovastatin (MEVACOR) 40 MG tablet Take 40 mg by mouth daily.   Yes Historical Provider, MD  Multiple Vitamins-Minerals (MULTIVITAMIN ADULTS) TABS Take 1 tablet by mouth daily.   Yes Historical Provider, MD  Omega-3 Fatty Acids (FISH OIL) 1000 MG CAPS Take 2 capsules by mouth daily.   Yes Historical Provider, MD  amoxicillin-clavulanate (AUGMENTIN) 875-125 MG tablet Take 1 tablet by mouth every 12 (twelve) hours. 09/14/16   Nat Christen, MD  meclizine (ANTIVERT) 25 MG tablet Take 1 tablet (25 mg total) by mouth  3 (three) times daily as needed for dizziness. 09/14/16   Nat Christen, MD  predniSONE (DELTASONE) 20 MG tablet Take 1 tablet (20 mg total) by mouth daily with breakfast. 09/14/16   Nat Christen, MD    Family History History reviewed. No pertinent family history.  Social History Social History  Substance Use Topics  . Smoking status: Current Some Day Smoker  . Smokeless tobacco: Never Used  . Alcohol use Yes     Comment: occasional     Allergies   Patient has no known allergies.   Review of Systems Review of Systems  All other systems reviewed and are negative.    Physical Exam Updated Vital Signs BP 155/89 (BP Location: Left Arm)   Pulse 74   Temp 98 F (36.7 C) (Oral)   Resp 18   SpO2 96%   Physical Exam  Constitutional: She is oriented to person, place, and time. She appears well-developed and well-nourished.  HENT:  Head: Normocephalic and atraumatic.  Right tympanic membrane reveals a thin layer of fluid on the inferior aspect  Eyes: Conjunctivae are normal.  Neck: Neck supple.  Cardiovascular: Normal rate and regular rhythm.   Pulmonary/Chest: Effort normal and breath sounds normal.  Abdominal: Soft. Bowel sounds are normal.  Musculoskeletal: Normal range of motion.  Neurological: She is alert and oriented to person, place, and time.  Slightly unstable when standing.  Skin: Skin is warm and dry. Rash: discharge medications Augmentin.  Psychiatric: She has a normal mood and affect. Her behavior is normal.  Nursing note and vitals reviewed.    ED Treatments / Results  Labs (all labs ordered are listed, but only abnormal results are displayed) Labs Reviewed - No data to display  EKG  EKG Interpretation None       Radiology No results found.  Procedures Procedures (including critical care time)  Medications Ordered in ED Medications  amoxicillin-clavulanate (AUGMENTIN) 875-125 MG per tablet 1 tablet (1 tablet Oral Given 09/14/16 1243)    predniSONE (DELTASONE) tablet 40 mg (40 mg Oral Given 09/14/16 1244)  meclizine (ANTIVERT) tablet 25 mg (25 mg Oral Given 09/14/16 1244)     Initial Impression / Assessment and Plan / ED Course  I have reviewed the triage vital signs and the nursing notes.  Pertinent labs & imaging results that were available during my care of the patient were reviewed by me and considered in my medical decision making (see chart for details).  Clinical Course    Patient is hemodynamically stable I suspect her vertigo is secondary to a right otitis media. Discharge medication Augmentin, Prednisone 20 mg, Antivert 25 mg  Final Clinical Impressions(s) / ED Diagnoses   Final diagnoses:  Right otitis media, unspecified otitis media type  Vertigo    New Prescriptions New Prescriptions   AMOXICILLIN-CLAVULANATE (AUGMENTIN) 875-125 MG TABLET    Take 1 tablet by mouth every 12 (twelve) hours.   MECLIZINE (ANTIVERT) 25 MG TABLET    Take 1 tablet (25 mg total) by mouth 3 (three) times daily as needed for dizziness.   PREDNISONE (DELTASONE) 20 MG TABLET    Take 1 tablet (20 mg total) by mouth daily with breakfast.     Nat Christen, MD 09/14/16 1414

## 2016-09-14 NOTE — ED Triage Notes (Signed)
Pt reports vertigo (room spinning sensation), emesis and R ear pain since Sunday at 0500. Tolerating PO fluids. Hx of vertigo, feels like same.

## 2016-09-14 NOTE — Discharge Instructions (Signed)
Prescription for antibiotic, dizziness medication, prednisone. Return if worse.

## 2016-09-14 NOTE — ED Notes (Signed)
Patient c/o of being dizzy at this time. Patient states, "i am dizzy just sitting here."

## 2016-09-15 ENCOUNTER — Other Ambulatory Visit: Payer: Medicare HMO

## 2016-09-15 ENCOUNTER — Encounter (HOSPITAL_COMMUNITY): Payer: Self-pay | Admitting: Emergency Medicine

## 2016-09-23 ENCOUNTER — Ambulatory Visit
Admission: RE | Admit: 2016-09-23 | Discharge: 2016-09-23 | Disposition: A | Discharge status: Home / Self Care | Payer: Medicare HMO | Source: Ambulatory Visit | Attending: Physician Assistant | Admitting: Physician Assistant

## 2016-09-23 DIAGNOSIS — Z13820 Encounter for screening for osteoporosis: Secondary | ICD-10-CM | POA: Diagnosis not present

## 2016-09-23 DIAGNOSIS — Z780 Asymptomatic menopausal state: Secondary | ICD-10-CM | POA: Diagnosis not present

## 2016-09-23 DIAGNOSIS — M8580 Other specified disorders of bone density and structure, unspecified site: Secondary | ICD-10-CM

## 2016-09-27 ENCOUNTER — Encounter: Payer: Self-pay | Admitting: Physician Assistant

## 2016-09-29 ENCOUNTER — Encounter: Payer: Self-pay | Admitting: Physician Assistant

## 2016-09-29 ENCOUNTER — Ambulatory Visit (INDEPENDENT_AMBULATORY_CARE_PROVIDER_SITE_OTHER): Payer: Medicare HMO | Admitting: Physician Assistant

## 2016-09-29 VITALS — BP 135/84 | HR 74 | Temp 98.1°F | Resp 18 | Ht 60.5 in | Wt 156.0 lb

## 2016-09-29 DIAGNOSIS — R42 Dizziness and giddiness: Secondary | ICD-10-CM

## 2016-09-29 DIAGNOSIS — H9311 Tinnitus, right ear: Secondary | ICD-10-CM | POA: Diagnosis not present

## 2016-09-29 MED ORDER — MECLIZINE HCL 25 MG PO TABS: 25.0000 mg | ORAL_TABLET | Freq: Three times a day (TID) | ORAL | 0 refills | Status: DC | PRN

## 2016-09-29 NOTE — Progress Notes (Signed)
Patient ID: Brandi Harrington, female    DOB: 10-Apr-1946, 71 y.o.   MRN: [id number]  PCP: Porfirio Oar, PA-C  Chief Complaint  Patient presents with  . Follow-up    Vertigo    Subjective:   Presents for evaluation of vertigo.  This has been a problem intermittently since early October and has resulted in several ED and PCP visits. She describes the room spinning, making it difficult to walk. Associated with ringing in the ears. Initially it was thought due to elevated BP, but her symptoms persist with normal BP readings.  Labs 06/2016: Lipase: normal CBC with differential: normal PT-INR: normal CMET notable for creatinine 1.01, otherwise normal Lactic Acid: 0.46 (mildly low) UA: normal  CT brain was performed 06/28/2016 and notable for "calcification of the major vessels at the base of the brain, common at this age."  Carotid artery duplex scan 07/10/2016 "Bilateral internal carotid artery velocities suggest a <40% stenosis."  Meclizine has not helped her symptoms.  She was referred to ENT but was dissatisfied with the visit and has not returned for re-evaluation. Hearing screening was normal, but she declined audiologic testing at that visit.   Review of Systems As above.    Patient Active Problem List   Diagnosis Date Noted  . Tinnitus, right ear 09/12/2016  . BMI 28.0-28.9,adult 06/25/2016  . Facial paresthesia 02/26/2015  . Lipoma of back 02/26/2015  . Tobacco use 01/18/2015  . Hepatitis C antibody test positive 02/15/2013  . HTN (hypertension) 05/12/2012  . Hyperlipidemia 05/12/2012  . Osteopenia   . TMJ syndrome   . Vitamin D deficiency     Prior to Admission medications   Medication Sig Start Date End Date Taking? Authorizing Provider  alendronate (FOSAMAX) 70 MG tablet TAKE ONE TABLET BY MOUTH ONCE A WEEK WITH A FULL GLASS OF WATER ON AN EMPTY STOMACH 06/25/16  Yes Gladys Deckard, PA-C  amLODipine (NORVASC) 10 MG tablet Take 1 tablet (10 mg total) by  mouth daily. 03/03/16  Yes Shawn Dannenberg, PA-C  aspirin EC 81 MG tablet Take 81 mg by mouth daily.   Yes Historical Provider, MD  cholecalciferol (VITAMIN D) 1000 units tablet Take 1,000 Units by mouth every evening.   Yes Historical Provider, MD  fish oil-omega-3 fatty acids 1000 MG capsule Take 2 g by mouth daily.   Yes Historical Provider, MD  lovastatin (MEVACOR) 40 MG tablet Take 1 tablet (40 mg total) by mouth daily. 03/03/16  Yes Kedra Mcglade, PA-C  meclizine (ANTIVERT) 25 MG tablet Take 1 tablet (25 mg total) by mouth 3 (three) times daily as needed for dizziness. 09/14/16  Yes Donnetta Hutching, MD  Multiple Vitamins-Minerals (MULTIVITAMIN ADULTS) TABS Take 1 tablet by mouth daily.   Yes Historical Provider, MD       Not on File     Objective:  Physical Exam  Constitutional: She is oriented to person, place, and time. She appears well-developed and well-nourished. She is active and cooperative. No distress.  BP 135/84 (BP Location: Right Arm, Patient Position: Sitting, Cuff Size: Normal)   Pulse 74   Temp 98.1 F (36.7 C) (Oral)   Resp 18   Ht 5' 0.5" (1.537 m)   Wt 156 lb (70.8 kg)   SpO2 99%   BMI 29.97 kg/m   HENT:  Head: Normocephalic and atraumatic.  Right Ear: Hearing normal.  Left Ear: Hearing normal.  Eyes: Conjunctivae are normal. No scleral icterus.  Neck: Normal range of motion. Neck supple.  No thyromegaly present.  Cardiovascular: Normal rate, regular rhythm and normal heart sounds.   Pulses:      Radial pulses are 2+ on the right side, and 2+ on the left side.  Pulmonary/Chest: Effort normal and breath sounds normal.  Lymphadenopathy:       Head (right side): No tonsillar, no preauricular, no posterior auricular and no occipital adenopathy present.       Head (left side): No tonsillar, no preauricular, no posterior auricular and no occipital adenopathy present.    She has no cervical adenopathy.       Right: No supraclavicular adenopathy present.       Left:  No supraclavicular adenopathy present.  Neurological: She is alert and oriented to person, place, and time. She has normal strength. No cranial nerve deficit or sensory deficit. Gait normal.  Skin: Skin is warm, dry and intact. No rash noted. No cyanosis or erythema. Nails show no clubbing.  Psychiatric: She has a normal mood and affect. Her speech is normal and behavior is normal.           Assessment & Plan:   1. Tinnitus of right ear 2. Vertigo Refer to ENT, a different practice, per her request. Continue meclizine, PRN. Rest. Hydrate. - Ambulatory referral to ENT - meclizine (ANTIVERT) 25 MG tablet; Take 1 tablet (25 mg total) by mouth 3 (three) times daily as needed for dizziness.  Dispense: 30 tablet; Refill: 0   Fernande Bras, PA-C Physician Assistant-Certified Primary Care at [facility]

## 2016-09-29 NOTE — Patient Instructions (Signed)
     IF you received an x-ray today, you will receive an invoice from [facility]. Please contact [facility] at [phone] with questions or concerns regarding your invoice.   IF you received labwork today, you will receive an invoice from [facility]. Please contact [facility] at [phone] with questions or concerns regarding your invoice.   Our billing staff will not be able to assist you with questions regarding bills from these companies.  You will be contacted with the lab results as soon as they are available. The fastest way to get your results is to activate your My Chart account. Instructions are located on the last page of this paperwork. If you have not heard from us regarding the results in 2 weeks, please contact this office.     

## 2016-10-20 DIAGNOSIS — H9191 Unspecified hearing loss, right ear: Secondary | ICD-10-CM | POA: Insufficient documentation

## 2016-10-20 DIAGNOSIS — R42 Dizziness and giddiness: Secondary | ICD-10-CM | POA: Insufficient documentation

## 2016-10-20 DIAGNOSIS — H903 Sensorineural hearing loss, bilateral: Secondary | ICD-10-CM | POA: Diagnosis not present

## 2016-10-20 DIAGNOSIS — H9311 Tinnitus, right ear: Secondary | ICD-10-CM | POA: Diagnosis not present

## 2016-10-22 ENCOUNTER — Other Ambulatory Visit: Payer: Self-pay | Admitting: Otolaryngology

## 2016-10-22 DIAGNOSIS — H9311 Tinnitus, right ear: Secondary | ICD-10-CM

## 2016-10-29 ENCOUNTER — Ambulatory Visit
Admission: RE | Admit: 2016-10-29 | Discharge: 2016-10-29 | Disposition: A | Discharge status: Home / Self Care | Payer: Medicare HMO | Source: Ambulatory Visit | Attending: Otolaryngology | Admitting: Otolaryngology

## 2016-10-29 DIAGNOSIS — H9311 Tinnitus, right ear: Secondary | ICD-10-CM | POA: Diagnosis not present

## 2016-10-29 MED ORDER — GADOBENATE DIMEGLUMINE 529 MG/ML IV SOLN
13.0000 mL | Freq: Once | INTRAVENOUS | Status: AC | PRN
  Administered 2016-10-29: 13 mL via INTRAVENOUS

## 2016-10-30 ENCOUNTER — Ambulatory Visit (INDEPENDENT_AMBULATORY_CARE_PROVIDER_SITE_OTHER): Payer: Medicare HMO | Admitting: Family Medicine

## 2016-10-30 VITALS — BP 130/80 | HR 64 | Temp 97.5°F | Resp 17 | Ht 60.5 in | Wt 151.0 lb

## 2016-10-30 DIAGNOSIS — R2681 Unsteadiness on feet: Secondary | ICD-10-CM | POA: Diagnosis not present

## 2016-10-30 DIAGNOSIS — R109 Unspecified abdominal pain: Secondary | ICD-10-CM | POA: Diagnosis not present

## 2016-10-30 DIAGNOSIS — R42 Dizziness and giddiness: Secondary | ICD-10-CM

## 2016-10-30 DIAGNOSIS — R1031 Right lower quadrant pain: Secondary | ICD-10-CM | POA: Diagnosis not present

## 2016-10-30 LAB — POCT URINALYSIS DIP (MANUAL ENTRY)
BILIRUBIN UA: NEGATIVE
Blood, UA: NEGATIVE
GLUCOSE UA: NEGATIVE
Ketones, POC UA: NEGATIVE
Leukocytes, UA: NEGATIVE
Nitrite, UA: NEGATIVE
Protein Ur, POC: NEGATIVE
UROBILINOGEN UA: 0.2
pH, UA: 5.5

## 2016-10-30 LAB — POCT CBC
Granulocyte percent: 40.3 %G (ref 37–80)
HEMATOCRIT: 38.3 % (ref 37.7–47.9)
HEMOGLOBIN: 13.6 g/dL (ref 12.2–16.2)
Lymph, poc: 3.5 — AB (ref 0.6–3.4)
MCH: 33.1 pg — AB (ref 27–31.2)
MCHC: 35.6 g/dL — AB (ref 31.8–35.4)
MCV: 93.1 fL (ref 80–97)
MID (cbc): 2.7 — AB (ref 0–0.9)
MPV: 7.4 fL (ref 0–99.8)
POC GRANULOCYTE: 2.7 (ref 2–6.9)
POC LYMPH PERCENT: 51.8 %L — AB (ref 10–50)
POC MID %: 7.9 % (ref 0–12)
Platelet Count, POC: 298 10*3/uL (ref 142–424)
RBC: 4.11 M/uL (ref 4.04–5.48)
RDW, POC: 12.7 %
WBC: 6.8 10*3/uL (ref 4.6–10.2)

## 2016-10-30 LAB — POC MICROSCOPIC URINALYSIS (UMFC): MUCUS RE: ABSENT

## 2016-10-30 NOTE — Progress Notes (Signed)
Chief Complaint  Patient presents with  . Dizziness  . Abdominal Pain    HPI   Pt reports that she has been having vertigo  She reports that this morning she woke up and noted an abdominal pain on the RLQ and it was really bad "to me it was 10/10" She reports that the pain has been nagging and would not go away.  She states that she took a meloxicam 7.5mg  at 10:15am and the pain was relieved but now the pain has returned but is not as severe as it was this morning.  She reports that she feels the pain on the inside but it feels like it goes from her groin to around her back.  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 Hyperlipidemia   . Hypertension   . Osteopenia   . TMJ syndrome   . Vertigo 09/14/2016  . Vitamin D deficiency     Current Outpatient Prescriptions  Medication Sig Dispense Refill  . alendronate (FOSAMAX) 70 MG tablet TAKE ONE TABLET BY MOUTH ONCE A WEEK WITH A FULL GLASS OF WATER ON AN EMPTY STOMACH 12 tablet 3  . amLODipine (NORVASC) 10 MG tablet Take 1 tablet (10 mg total) by mouth daily. 90 tablet 3  . aspirin EC 81 MG tablet Take 81 mg by mouth daily.    . cholecalciferol (VITAMIN D) 1000 units tablet Take 1,000 Units by mouth every evening.    . fish oil-omega-3 fatty acids 1000 MG capsule Take 2 g by mouth daily.    Marland Kitchen lovastatin (MEVACOR) 40 MG tablet Take 1 tablet (40 mg total) by mouth daily. 90 tablet 3  . meclizine (ANTIVERT) 25 MG tablet Take 1 tablet (25 mg total) by mouth 3 (three) times daily as needed for dizziness. 30 tablet 0  . Multiple Vitamins-Minerals (MULTIVITAMIN ADULTS) TABS Take 1 tablet by mouth daily.     No current facility-administered medications for this visit.     Allergies: Not on File  Past Surgical History:  Procedure Laterality  Date  . TUBAL LIGATION      Social History   Social History  . Marital status: Divorced    Spouse name: n/a  . Number of children: 2  . Years of education: 12th grade   Occupational History  . [employer]   Social History Main Topics  . Smoking status: Current Some Day Smoker    Packs/day: 0.30    Types: Cigarettes  . Smokeless tobacco: Never Used     Comment: smokes a few cigarettes after work when she drinks a beer  . Alcohol use Yes     Comment: occasional  . Drug use: No  . Sexual activity: Yes    Partners: Male    Birth control/ protection: Post-menopausal   Other Topics Concern  . None   Social History Narrative   ** Merged History Encounter **       House on the [employer] bankruptcy (not her business)-couldn't get a loan modification.  Ex-husband s/p CVA x several, residual left hemiparesis, DM diagnosed at age 55, ASCVD (severe), s/p CABG . He's ill-humored and "mean." He moved into    an assisted living facility fall 2014, and  they divorced in 10/2014. 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 She appears well-developed and well-nourished.  HENT:  Head: Normocephalic and atraumatic.  Right Ear: External ear normal.  Left Ear: External ear normal.  Nose: Nose normal.  Mouth/Throat: Oropharynx is clear and moist.  Eyes: Conjunctivae and EOM are normal.  Neck: Normal range of motion. No thyromegaly present.  Cardiovascular: Normal rate, regular rhythm and normal heart sounds.   No murmur heard. Pulmonary/Chest: Effort normal and breath sounds normal. No respiratory distress. She has no wheezes.    Abdominal: Soft. Bowel sounds are normal. She exhibits no distension. There is no tenderness. There is no guarding.  Musculoskeletal: Normal range of motion. She exhibits no edema.  Neurological: She is alert and oriented to person, place, and time. No cranial nerve deficit. Coordination normal.  Meandering gait noted with ambulation  Skin: Skin is warm. Capillary refill takes less than 2 seconds. No erythema.  Psychiatric: She has a normal mood and affect. Her behavior is normal. Judgment and thought content normal.   Lab Results  Component Value Date   WBC 6.8 10/30/2016   HGB 13.6 10/30/2016   HCT 38.3 10/30/2016   MCV 93.1 10/30/2016   PLT 334 09/01/2016    Assessment and Plan Brandi Harrington was seen today for dizziness and abdominal pain.  Diagnoses and all orders for this visit:  RLQ abdominal pain Right flank pain No UTI, no acute abdomen, no leukocytosis Gave anticipatory guidance for acute abdomen -     POCT Microscopic Urinalysis ([facility]) -     POCT urinalysis dipstick -     POCT CBC  Vertigo- advised follow up with ENT Likely patient needs vestibular rehab Reviewed MRI    Gait instability- fall precautions reviewed No acute findings on MRI     Brandi Harrington A Nolon Rod

## 2016-10-30 NOTE — Patient Instructions (Addendum)
     IF you received an x-ray today, you will receive an invoice from [facility]. Please contact [facility] at [phone] with questions or concerns regarding your invoice.   IF you received labwork today, you will receive an invoice from [facility]. Please contact [facility] at [phone] with questions or concerns regarding your invoice.   Our billing staff will not be able to assist you with questions regarding bills from these companies.  You will be contacted with the lab results as soon as they are available. The fastest way to get your results is to activate your My Chart account. Instructions are located on the last page of this paperwork. If you have not heard from [place] regarding the results in 2 weeks, please contact this office.      Abdominal Pain, Adult Abdominal pain can be caused by many things. Often, abdominal pain is not serious and it gets better with no treatment or by being treated at home. However, sometimes abdominal pain is serious. Your health care provider will do a medical history and a physical exam to try to determine the cause of your abdominal pain. Follow these instructions at home:  Take over-the-counter and prescription medicines only as told by your health care provider. Do not take a laxative unless told by your health care provider.  Drink enough fluid to keep your urine clear or pale yellow.  Watch your condition for any changes.  Keep all follow-up visits as told by your health care provider. This is important. Contact a health care provider if:  Your abdominal pain changes or gets worse.  You are not hungry or you lose weight without trying.  You are constipated or have diarrhea for more than 2-3 days.  You have pain when you urinate or have a bowel movement.  Your abdominal pain wakes you up at night.  Your pain gets worse with meals, after eating, or with certain foods.  You are throwing up and cannot keep anything  down.  You have a fever. Get help right away if:  Your pain does not go away as soon as your health care provider told you to expect.  You cannot stop throwing up.  Your pain is only in areas of the abdomen, such as the right side or the left lower portion of the abdomen.  You have bloody or black stools, or stools that look like tar.  You have severe pain, cramping, or bloating in your abdomen.  You have signs of dehydration, such as:  Dark urine, very little urine, or no urine.  Cracked lips.  Dry mouth.  Sunken eyes.  Sleepiness.  Weakness. This information is not intended to replace advice given to you by your health care provider. Make sure you discuss any questions you have with your health care provider. Document Released: 06/17/2005 Document Revised: 03/27/2016 Document Reviewed: 02/19/2016 Elsevier Interactive Patient Education  2017 [employer].

## 2016-11-17 ENCOUNTER — Ambulatory Visit (INDEPENDENT_AMBULATORY_CARE_PROVIDER_SITE_OTHER): Payer: Medicare HMO | Admitting: Physician Assistant

## 2016-11-17 ENCOUNTER — Encounter: Payer: Self-pay | Admitting: Physician Assistant

## 2016-11-17 VITALS — BP 122/80 | HR 67 | Temp 98.8°F | Resp 16 | Ht 60.0 in | Wt 154.0 lb

## 2016-11-17 DIAGNOSIS — R42 Dizziness and giddiness: Secondary | ICD-10-CM

## 2016-11-17 DIAGNOSIS — M5030 Other cervical disc degeneration, unspecified cervical region: Secondary | ICD-10-CM | POA: Insufficient documentation

## 2016-11-17 MED ORDER — PREDNISONE 20 MG PO TABS: ORAL_TABLET | ORAL | 0 refills | Status: DC

## 2016-11-17 NOTE — Patient Instructions (Addendum)
If the symptoms persist following the prednisone, let me know. I'll refer you for the physical therapy.  Don't take the meloxicam while you are taking the prednisone. If you need something additional for pain while you are taking the prednisone, stick with acetaminophen (Tylenol).    IF you received an x-ray today, you will receive an invoice from [facility]. Please contact [facility] at [phone] with questions or concerns regarding your invoice.   IF you received labwork today, you will receive an invoice from [facility]. Please contact [facility] at [phone] with questions or concerns regarding your invoice.   Our billing staff will not be able to assist you with questions regarding bills from these companies.  You will be contacted with the lab results as soon as they are available. The fastest way to get your results is to activate your My Chart account. Instructions are located on the last page of this paperwork. If you have not heard from [place] regarding the results in 2 weeks, please contact this office.

## 2016-11-17 NOTE — Progress Notes (Signed)
Patient ID: Brandi Harrington, female    DOB: [birth year], 71 y.o.   MRN: [id number]  PCP: Porfirio Oar, PA-C  Chief Complaint  Patient presents with  . Follow-up    vertigo/ pt not feeling bad    Subjective:   Presents for evaluation of vertigo, but states that she's really not here for that, she wants to know if vertigo could cause neck pain.  Having soreness in the back of the neck with rotation of her head. No trauma or injury, no new activity.  Vertigo persists, but is stable. She is frustrated. She isn't driving due to the vertigo. Meclizine doesn't help, but she continues to take it every morning and sometimes again later in the day. Gets up once during the night to urinate. Goes back to sleep easily.   She did se ENT. Dr. Pollyann Kennedy advised she could try prednisone, but she wanted to follow-up with me first. She felt well cared for and was pleased with her visit, just wanted my consent.  MRI 10/29/2016 ordered by Dr. Pollyann Kennedy: IMPRESSION: Normal MRI of the internal auditory canals with and without contrast. Old small bilateral cerebellar infarcts.  Grade 1 C3-4 anterolisthesis on degenerative basis. Moderate to severe facet arthropathy and degenerative discs. Otherwise negative MRI of the head with and without contrast for age.   Review of Systems As above.    Patient Active Problem List   Diagnosis Date Noted  . Tinnitus, right ear 09/12/2016  . BMI 28.0-28.9,adult 06/25/2016  . Facial paresthesia 02/26/2015  . Lipoma of back 02/26/2015  . Tobacco use 01/18/2015  . Hepatitis C antibody test positive 02/15/2013  . HTN (hypertension) 05/12/2012  . Hyperlipidemia 05/12/2012  . Osteopenia   . TMJ syndrome   . Vitamin D deficiency      Prior to Admission medications   Medication Sig Start Date End Date Taking? Authorizing Provider  alendronate (FOSAMAX) 70 MG tablet TAKE ONE TABLET BY MOUTH ONCE A WEEK WITH A FULL GLASS OF WATER ON AN EMPTY STOMACH 06/25/16  Yes  Shinita Mac, PA-C  amLODipine (NORVASC) 10 MG tablet Take 1 tablet (10 mg total) by mouth daily. 03/03/16  Yes Arielys Wandersee, PA-C  aspirin EC 81 MG tablet Take 81 mg by mouth daily.   Yes Historical Provider, MD  cholecalciferol (VITAMIN D) 1000 units tablet Take 1,000 Units by mouth every evening.   Yes Historical Provider, MD  fish oil-omega-3 fatty acids 1000 MG capsule Take 2 g by mouth daily.   Yes Historical Provider, MD  lovastatin (MEVACOR) 40 MG tablet Take 1 tablet (40 mg total) by mouth daily. 03/03/16  Yes Kyleigha Markert, PA-C  meclizine (ANTIVERT) 25 MG tablet Take 1 tablet (25 mg total) by mouth 3 (three) times daily as needed for dizziness. 09/29/16  Yes Reem Fleury, PA-C  Multiple Vitamins-Minerals (MULTIVITAMIN ADULTS) TABS Take 1 tablet by mouth daily.   Yes Historical Provider, MD     Not on File     Objective:  Physical Exam  Constitutional: She is oriented to person, place, and time. She appears well-developed and well-nourished. She is active and cooperative. No distress.  BP 122/80   Pulse 67   Temp 98.8 F (37.1 C) (Oral)   Resp 16   Ht 5' (1.524 m)   Wt 154 lb (69.9 kg)   SpO2 99%   BMI 30.08 kg/m   HENT:  Head: Normocephalic and atraumatic.  Right Ear: Hearing normal.  Left Ear: Hearing normal.  Eyes: Conjunctivae are normal. No scleral icterus.  Neck: Normal range of motion. Neck supple. No thyromegaly present.  Cardiovascular: Normal rate, regular rhythm and normal heart sounds.   Pulses:      Radial pulses are 2+ on the right side, and 2+ on the left side.  Pulmonary/Chest: Effort normal and breath sounds normal.  Musculoskeletal:       Cervical back: She exhibits pain (with AROM (rotation to right and left)). She exhibits normal range of motion, no tenderness, no bony tenderness, no swelling and no spasm.  Lymphadenopathy:       Head (right side): No tonsillar, no preauricular, no posterior auricular and no occipital adenopathy present.        Head (left side): No tonsillar, no preauricular, no posterior auricular and no occipital adenopathy present.    She has no cervical adenopathy.       Right: No supraclavicular adenopathy present.       Left: No supraclavicular adenopathy present.  Neurological: She is alert and oriented to person, place, and time. No sensory deficit.  Skin: Skin is warm, dry and intact. No rash noted. No cyanosis or erythema. Nails show no clubbing.  Psychiatric: She has a normal mood and affect. Her speech is normal and behavior is normal.       Assessment & Plan:   1. Vertigo Trial of prednisone. If no improvement, plan referral for PT. - predniSONE (DELTASONE) 20 MG tablet; Take 3 PO QAM x3days, 2 PO QAM x3days, 1 PO QAM x3days  Dispense: 18 tablet; Refill: 0  2. Neck pain, likely due to DDD. Expect prednisone will help.    Fernande Bras, PA-C Physician Assistant-Certified Primary Care at [facility]

## 2016-11-18 ENCOUNTER — Ambulatory Visit: Payer: Self-pay

## 2016-11-18 DIAGNOSIS — R42 Dizziness and giddiness: Secondary | ICD-10-CM | POA: Diagnosis not present

## 2016-11-18 DIAGNOSIS — K219 Gastro-esophageal reflux disease without esophagitis: Secondary | ICD-10-CM | POA: Diagnosis not present

## 2016-11-18 DIAGNOSIS — M1990 Unspecified osteoarthritis, unspecified site: Secondary | ICD-10-CM | POA: Diagnosis not present

## 2016-11-18 DIAGNOSIS — E785 Hyperlipidemia, unspecified: Secondary | ICD-10-CM | POA: Diagnosis not present

## 2016-11-18 DIAGNOSIS — I10 Essential (primary) hypertension: Secondary | ICD-10-CM | POA: Diagnosis not present

## 2016-11-18 DIAGNOSIS — I639 Cerebral infarction, unspecified: Secondary | ICD-10-CM | POA: Diagnosis not present

## 2017-02-17 DIAGNOSIS — K219 Gastro-esophageal reflux disease without esophagitis: Secondary | ICD-10-CM | POA: Diagnosis not present

## 2017-02-17 DIAGNOSIS — M1990 Unspecified osteoarthritis, unspecified site: Secondary | ICD-10-CM | POA: Diagnosis not present

## 2017-02-17 DIAGNOSIS — E785 Hyperlipidemia, unspecified: Secondary | ICD-10-CM | POA: Diagnosis not present

## 2017-02-17 DIAGNOSIS — I639 Cerebral infarction, unspecified: Secondary | ICD-10-CM | POA: Diagnosis not present

## 2017-02-17 DIAGNOSIS — I10 Essential (primary) hypertension: Secondary | ICD-10-CM | POA: Diagnosis not present

## 2017-02-17 DIAGNOSIS — R42 Dizziness and giddiness: Secondary | ICD-10-CM | POA: Diagnosis not present

## 2017-02-23 ENCOUNTER — Ambulatory Visit (INDEPENDENT_AMBULATORY_CARE_PROVIDER_SITE_OTHER): Payer: Medicare HMO | Admitting: Physician Assistant

## 2017-02-23 ENCOUNTER — Encounter: Payer: Self-pay | Admitting: Physician Assistant

## 2017-02-23 VITALS — BP 135/85 | HR 80 | Resp 16 | Ht 61.0 in | Wt 154.4 lb

## 2017-02-23 DIAGNOSIS — Z1211 Encounter for screening for malignant neoplasm of colon: Secondary | ICD-10-CM | POA: Diagnosis not present

## 2017-02-23 DIAGNOSIS — E785 Hyperlipidemia, unspecified: Secondary | ICD-10-CM | POA: Diagnosis not present

## 2017-02-23 DIAGNOSIS — E559 Vitamin D deficiency, unspecified: Secondary | ICD-10-CM | POA: Diagnosis not present

## 2017-02-23 DIAGNOSIS — I10 Essential (primary) hypertension: Secondary | ICD-10-CM | POA: Diagnosis not present

## 2017-02-23 DIAGNOSIS — L309 Dermatitis, unspecified: Secondary | ICD-10-CM

## 2017-02-23 DIAGNOSIS — Z6829 Body mass index (BMI) 29.0-29.9, adult: Secondary | ICD-10-CM

## 2017-02-23 DIAGNOSIS — Z720 Tobacco use: Secondary | ICD-10-CM | POA: Diagnosis not present

## 2017-02-23 DIAGNOSIS — M5030 Other cervical disc degeneration, unspecified cervical region: Secondary | ICD-10-CM

## 2017-02-23 MED ORDER — KETOCONAZOLE 2 % EX CREA: 1.0000 "application " | TOPICAL_CREAM | Freq: Two times a day (BID) | CUTANEOUS | 0 refills | Status: DC

## 2017-02-23 MED ORDER — MELOXICAM 15 MG PO TABS: 15.0000 mg | ORAL_TABLET | Freq: Every day | ORAL | 1 refills | Status: DC

## 2017-02-23 NOTE — Patient Instructions (Addendum)
     IF you received an x-ray today, you will receive an invoice from [facility]. Please contact [facility] at [phone] with questions or concerns regarding your invoice.   IF you received labwork today, you will receive an invoice from [facility]. Please contact [facility] at [phone] with questions or concerns regarding your invoice.   Our billing staff will not be able to assist you with questions regarding bills from these companies.  You will be contacted with the lab results as soon as they are available. The fastest way to get your results is to activate your My Chart account. Instructions are located on the last page of this paperwork. If you have not heard from us regarding the results in 2 weeks, please contact this office.    Did you know that you begin to benefit from quitting smoking within the first twenty minutes? It's TRUE.  At 20 minutes: -blood pressure decreases -pulse rate drops -body temperature of hands and feet increases  At 8 hours: -carbon monoxide level in blood drops to normal -oxygen level in blood increases to normal  At 24 hours: -the chance of heart attack decreases  At 48 hours: -nerve endings start regrowing -ability to smell and taste is enhanced  2 weeks-3 months: -circulation improves -walking becomes easier -lung function improves  1-9 months: -coughing, sinus congestion, fatigue and shortness of breath decreases  1 year: -excess risk of heart disease is decreased to HALF that of a smoker  5 years: Stroke risk is reduced to that of people who have never smoked  10 years: -risk of lung cancer drops to as little as half that of continuing smokers -risk of cancer of the mouth, throat, esophagus, bladder, kidney and pancreas decreases -risk of ulcer decreases  15 years -risk of heart disease is now similar to that of people who have never smoked -risk of death returns to nearly the level of people who have  never smoked   

## 2017-02-23 NOTE — Progress Notes (Signed)
Patient ID: Brandi Harrington, female    DOB: [birth year], 71 y.o.   MRN: [id number]  PCP: Porfirio Oar, PA-C  Chief Complaint  Patient presents with  . Follow-up    blood pressure and cholesterol  . Rash    along L side of neck and chest  . Medication Refill    meloxicam    Subjective:   Presents for evaluation of HTN, a recurrent rash on the LEFT neck and upper chest, and medication refills. She is accompanied today by two grandchildren for whom she is babysitting..  The rash is one she's had before, a tinea, she thinks. Topical product was helpful. It's itchy, especially when she gets hot.  She continues to tolerate her medications for BP and cholesterol. Vertigo persists, and she is unhappy about that. Doesn't always feel comfortable driving, can't use the treadmill, but is getting adjusted..    Review of Systems  Constitutional: Negative.   HENT: Negative for sore throat.   Eyes: Negative for visual disturbance.  Respiratory: Negative for cough, chest tightness, shortness of breath and wheezing.   Cardiovascular: Negative for chest pain and palpitations.  Gastrointestinal: Negative for abdominal pain, diarrhea, nausea and vomiting.  Genitourinary: Negative for dysuria, frequency, hematuria and urgency.  Musculoskeletal: Positive for neck pain. Negative for arthralgias and myalgias.  Skin: Positive for rash.  Neurological: Positive for dizziness (vertigo). Negative for weakness and headaches.  Psychiatric/Behavioral: Negative for decreased concentration. The patient is not nervous/anxious.        Patient Active Problem List   Diagnosis Date Noted  . DDD (degenerative disc disease), cervical 11/17/2016  . Unilateral hearing loss, right 10/20/2016  . Vertigo 10/20/2016  . Tinnitus, right ear 09/12/2016  . BMI 28.0-28.9,adult 06/25/2016  . Facial paresthesia 02/26/2015  . Lipoma of back 02/26/2015  . Tobacco use 01/18/2015  . Hepatitis C antibody test positive  02/15/2013  . HTN (hypertension) 05/12/2012  . Hyperlipidemia 05/12/2012  . Osteopenia   . TMJ syndrome   . Vitamin D deficiency      Prior to Admission medications   Medication Sig Start Date End Date Taking? Authorizing Provider  alendronate (FOSAMAX) 70 MG tablet TAKE ONE TABLET BY MOUTH ONCE A WEEK WITH A FULL GLASS OF WATER ON AN EMPTY STOMACH 06/25/16  Yes Rae Plotner, PA-C  amLODipine (NORVASC) 10 MG tablet Take 1 tablet (10 mg total) by mouth daily. 03/03/16  Yes Porfirio Oar, PA-C  aspirin EC 81 MG tablet Take 81 mg by mouth daily.   Yes [provider]  fish oil-omega-3 fatty acids 1000 MG capsule Take 2 g by mouth daily.   Yes [provider]  lovastatin (MEVACOR) 40 MG tablet Take 1 tablet (40 mg total) by mouth daily. 03/03/16  Yes Cordelia Bessinger, PA-C  meclizine (ANTIVERT) 25 MG tablet Take 1 tablet (25 mg total) by mouth 3 (three) times daily as needed for dizziness. 09/29/16  Yes Nely Dedmon, PA-C  Multiple Vitamins-Minerals (MULTIVITAMIN ADULTS) TABS Take 1 tablet by mouth daily.   Yes [provider]  cholecalciferol (VITAMIN D) 1000 units tablet Take 1,000 Units by mouth every evening.    [provider]  predniSONE (DELTASONE) 20 MG tablet Take 3 PO QAM x3days, 2 PO QAM x3days, 1 PO QAM x3days Patient not taking: Reported on 02/23/2017 11/17/16   Porfirio Oar, PA-C     No Known Allergies     Objective:  Physical Exam  Constitutional: She is oriented to person, place, and  time. She appears well-developed and well-nourished. She is active and cooperative. No distress.  BP 135/85 (BP Location: Right Arm, Patient Position: Sitting, Cuff Size: Large)   Pulse 80   Resp 16   Ht 5\' 1"  (1.549 m)   Wt 154 lb 6.4 oz (70 kg)   SpO2 100%   BMI 29.17 kg/m   HENT:  Head: Normocephalic and atraumatic.  Right Ear: Hearing normal.  Left Ear: Hearing normal.  Eyes: Conjunctivae are normal. No scleral icterus.  Neck: Normal range  of motion. Neck supple. No thyromegaly present.  Cardiovascular: Normal rate, regular rhythm and normal heart sounds.   Pulses:      Radial pulses are 2+ on the right side, and 2+ on the left side.  Pulmonary/Chest: Effort normal and breath sounds normal.  Lymphadenopathy:       Head (right side): No tonsillar, no preauricular, no posterior auricular and no occipital adenopathy present.       Head (left side): No tonsillar, no preauricular, no posterior auricular and no occipital adenopathy present.    She has no cervical adenopathy.       Right: No supraclavicular adenopathy present.       Left: No supraclavicular adenopathy present.  Neurological: She is alert and oriented to person, place, and time. No sensory deficit.  Skin: Skin is warm, dry and intact. Rash noted. Rash is macular (oval lesions with fine scale of the LEFT neck and upper chest). No cyanosis or erythema. Nails show no clubbing.  Psychiatric: She has a normal mood and affect. Her speech is normal and behavior is normal.           Assessment & Plan:   Problem List Items Addressed This Visit    HTN (hypertension) - Primary (Chronic)    Controlled. No changes today. Smoking cessation encouraged.      Relevant Orders   CBC (Completed)   Comprehensive metabolic panel (Completed)   TSH (Completed)   T4, free (Completed)   Hyperlipidemia (Chronic)    Await labs. Adjust regimen as indicated by results.       Relevant Orders   Comprehensive metabolic panel (Completed)   Lipid panel (Completed)   Vitamin D deficiency    Await labs. Adjust regimen as indicated by results.       Relevant Orders   VITAMIN D 25 Hydroxy (Vit-D Deficiency, Fractures) (Completed)   Tobacco use    Again encouraged cessation. She is not presently interested.      BMI 29.0-29.9,adult    Encouraged healthy eating and exercise. Explore options other than the treadmill.      DDD (degenerative disc disease), cervical    Continue  meloxicam.      Relevant Medications   meloxicam (MOBIC) 15 MG tablet    Other Visit Diagnoses    Dermatitis       Relevant Medications   ketoconazole (NIZORAL) 2 % cream   Screening for colon cancer       Relevant Orders   Ambulatory referral to Gastroenterology       Return in about 6 months (around 08/25/2017) for re-evaluation of blood pressure and cholesterol.   Fernande Bras, PA-C Primary Care at [facility]

## 2017-02-24 LAB — T4, FREE: Free T4: 1.24 ng/dL (ref 0.82–1.77)

## 2017-02-24 LAB — CBC
HEMATOCRIT: 36.6 % (ref 34.0–46.6)
HEMOGLOBIN: 12.5 g/dL (ref 11.1–15.9)
MCH: 31.9 pg (ref 26.6–33.0)
MCHC: 34.2 g/dL (ref 31.5–35.7)
MCV: 93 fL (ref 79–97)
Platelets: 322 10*3/uL (ref 150–379)
RBC: 3.92 x10E6/uL (ref 3.77–5.28)
RDW: 14.1 % (ref 12.3–15.4)
WBC: 6.5 10*3/uL (ref 3.4–10.8)

## 2017-02-24 LAB — COMPREHENSIVE METABOLIC PANEL
ALBUMIN: 4.1 g/dL (ref 3.6–4.8)
ALK PHOS: 67 IU/L (ref 39–117)
ALT: 20 IU/L (ref 0–32)
AST: 30 IU/L (ref 0–40)
Albumin/Globulin Ratio: 1.6 (ref 1.2–2.2)
BUN / CREAT RATIO: 16 (ref 12–28)
BUN: 14 mg/dL (ref 8–27)
Bilirubin Total: <0.2 mg/dL (ref 0.0–1.2)
CO2: 22 mmol/L (ref 18–29)
CREATININE: 0.89 mg/dL (ref 0.57–1.00)
Calcium: 9.3 mg/dL (ref 8.7–10.3)
Chloride: 107 mmol/L — ABNORMAL HIGH (ref 96–106)
GFR calc non Af Amer: 66 mL/min/{1.73_m2} (ref >59)
GFR, EST AFRICAN AMERICAN: 76 mL/min/{1.73_m2} (ref >59)
GLOBULIN, TOTAL: 2.6 g/dL (ref 1.5–4.5)
GLUCOSE: 98 mg/dL (ref 65–99)
Potassium: 4 mmol/L (ref 3.5–5.2)
SODIUM: 146 mmol/L — AB (ref 134–144)
TOTAL PROTEIN: 6.7 g/dL (ref 6.0–8.5)

## 2017-02-24 LAB — TSH: TSH: 1.09 u[IU]/mL (ref 0.450–4.500)

## 2017-02-24 LAB — LIPID PANEL
CHOL/HDL RATIO: 2.3 ratio (ref 0.0–4.4)
Cholesterol, Total: 182 mg/dL (ref 100–199)
HDL: 80 mg/dL (ref >39)
LDL CALC: 86 mg/dL (ref 0–99)
Triglycerides: 78 mg/dL (ref 0–149)
VLDL CHOLESTEROL CAL: 16 mg/dL (ref 5–40)

## 2017-02-24 LAB — VITAMIN D 25 HYDROXY (VIT D DEFICIENCY, FRACTURES): Vit D, 25-Hydroxy: 39.6 ng/mL (ref 30.0–100.0)

## 2017-02-24 NOTE — Assessment & Plan Note (Signed)
Controlled. No changes today. Smoking cessation encouraged.

## 2017-02-24 NOTE — Assessment & Plan Note (Signed)
Again encouraged cessation. She is not presently interested.

## 2017-02-24 NOTE — Assessment & Plan Note (Signed)
Continue meloxicam

## 2017-02-24 NOTE — Assessment & Plan Note (Signed)
Await labs. Adjust regimen as indicated by results.  

## 2017-02-24 NOTE — Assessment & Plan Note (Signed)
Encouraged healthy eating and exercise. Explore options other than the treadmill.

## 2017-02-26 ENCOUNTER — Encounter: Payer: Self-pay | Admitting: Physician Assistant

## 2017-03-01 ENCOUNTER — Encounter (HOSPITAL_COMMUNITY): Payer: Self-pay

## 2017-03-01 DIAGNOSIS — R51 Headache: Secondary | ICD-10-CM | POA: Insufficient documentation

## 2017-03-01 DIAGNOSIS — Z5321 Procedure and treatment not carried out due to patient leaving prior to being seen by health care provider: Secondary | ICD-10-CM | POA: Diagnosis not present

## 2017-03-01 NOTE — ED Triage Notes (Signed)
Pt here for headache on right side of head, sts hx of vertigo but no dizziness now.

## 2017-03-02 ENCOUNTER — Emergency Department (HOSPITAL_COMMUNITY)
Admission: EM | Admit: 2017-03-02 | Discharge: 2017-03-02 | Disposition: A | Discharge status: Home / Self Care | Payer: Medicare HMO | Attending: Emergency Medicine | Admitting: Emergency Medicine

## 2017-03-02 ENCOUNTER — Telehealth: Payer: Self-pay | Admitting: Physician Assistant

## 2017-03-02 NOTE — ED Notes (Signed)
Patient's family member updated on wait time.

## 2017-03-02 NOTE — ED Notes (Signed)
Pt stated she has to be at work at 5 so is leaving. Pt and family has left.

## 2017-03-02 NOTE — Telephone Encounter (Signed)
Pt is calling to talk with Chelle about her hospital visit last night   Best [phone]

## 2017-03-03 ENCOUNTER — Encounter (HOSPITAL_COMMUNITY): Payer: Self-pay

## 2017-03-03 ENCOUNTER — Ambulatory Visit (INDEPENDENT_AMBULATORY_CARE_PROVIDER_SITE_OTHER): Payer: Medicare HMO | Admitting: Physician Assistant

## 2017-03-03 ENCOUNTER — Telehealth: Payer: Self-pay | Admitting: Physician Assistant

## 2017-03-03 ENCOUNTER — Ambulatory Visit (HOSPITAL_COMMUNITY)
Admission: RE | Admit: 2017-03-03 | Discharge: 2017-03-03 | Disposition: A | Discharge status: Home / Self Care | Payer: Medicare HMO | Source: Ambulatory Visit | Attending: Physician Assistant | Admitting: Physician Assistant

## 2017-03-03 ENCOUNTER — Encounter: Payer: Self-pay | Admitting: Physician Assistant

## 2017-03-03 VITALS — BP 146/86 | HR 77 | Temp 98.6°F | Resp 16 | Ht 62.0 in | Wt 154.0 lb

## 2017-03-03 DIAGNOSIS — J322 Chronic ethmoidal sinusitis: Secondary | ICD-10-CM | POA: Diagnosis not present

## 2017-03-03 DIAGNOSIS — R51 Headache: Secondary | ICD-10-CM

## 2017-03-03 DIAGNOSIS — R42 Dizziness and giddiness: Secondary | ICD-10-CM | POA: Diagnosis not present

## 2017-03-03 DIAGNOSIS — J329 Chronic sinusitis, unspecified: Secondary | ICD-10-CM | POA: Diagnosis not present

## 2017-03-03 DIAGNOSIS — R011 Cardiac murmur, unspecified: Secondary | ICD-10-CM

## 2017-03-03 DIAGNOSIS — R519 Headache, unspecified: Secondary | ICD-10-CM

## 2017-03-03 NOTE — Telephone Encounter (Signed)
Called [employer] to get prior auth for STAT CT for pt this afternoon scheduled at 3:30pm. Brandi Harrington is pending and the reference number is [id number]. Clinicals are requested to be faxed to [phone]. Turn around time is 4 hours. I did let them know this was a STAT. For questions, Bernadene Person can be contacted at [phone].

## 2017-03-03 NOTE — Progress Notes (Signed)
Patient ID: Brandi Harrington, female    DOB: February 22, 1946, 71 y.o.   MRN: [id number]  PCP: Porfirio Oar, PA-C  Chief Complaint  Patient presents with  . Headache    On MOnday pt felt a sharp pain on the right side of her head that radiated to the left side - pt was seen in the ER     Subjective:   Presents for evaluation of headache.  03/01/2017, while seated at the kitchen table and  talking to her daughter, she developed sudden onset of pain in the RIGHT side of her head. The sensation was that someone had struck the side of her head. She had some mild visual change in the RIGHT eye, but no weakness, no slurring of the speech, facial droop.  The headache resolved after 15-20 minutes and has not recurred.  She continues to experience vertigo, an ongoing issue for a number of months, not worsened by the head pain.  She went to the emergency department on 03/01/2017, and due to high volume, had not yet been seen by 4 am 03/02/2017. She left in order to work as scheduled at 5 am.    Review of Systems As above. No CP, SOB, nausea, vomiting, diarrhea.    Patient Active Problem List   Diagnosis Date Noted  . DDD (degenerative disc disease), cervical 11/17/2016  . Unilateral hearing loss, right 10/20/2016  . Vertigo 10/20/2016  . Tinnitus, right ear 09/12/2016  . BMI 29.0-29.9,adult 06/25/2016  . Facial paresthesia 02/26/2015  . Lipoma of back 02/26/2015  . Tobacco use 01/18/2015  . Hepatitis C antibody test positive 02/15/2013  . HTN (hypertension) 05/12/2012  . Hyperlipidemia 05/12/2012  . Osteopenia   . TMJ syndrome   . Vitamin D deficiency      Prior to Admission medications   Medication Sig Start Date End Date Taking? Authorizing Provider  alendronate (FOSAMAX) 70 MG tablet TAKE ONE TABLET BY MOUTH ONCE A WEEK WITH A FULL GLASS OF WATER ON AN EMPTY STOMACH 06/25/16  Yes Lyndall Bellot, PA-C  amLODipine (NORVASC) 10 MG tablet Take 1 tablet (10 mg total) by mouth  daily. 03/03/16  Yes Porfirio Oar, PA-C  aspirin EC 81 MG tablet Take 81 mg by mouth daily.   Yes [provider]  cholecalciferol (VITAMIN D) 1000 units tablet Take 1,000 Units by mouth every evening.   Yes [provider]  fish oil-omega-3 fatty acids 1000 MG capsule Take 2 g by mouth daily.   Yes [provider]  ketoconazole (NIZORAL) 2 % cream Apply 1 application topically 2 (two) times daily. 02/23/17  Yes Tarence Searcy, PA-C  lovastatin (MEVACOR) 40 MG tablet Take 1 tablet (40 mg total) by mouth daily. 03/03/16  Yes Filemon Breton, PA-C  meclizine (ANTIVERT) 25 MG tablet Take 1 tablet (25 mg total) by mouth 3 (three) times daily as needed for dizziness. 09/29/16  Yes Glennis Borger, PA-C  meloxicam (MOBIC) 15 MG tablet Take 1 tablet (15 mg total) by mouth daily. 02/23/17  Yes Hykeem Ojeda, PA-C  Multiple Vitamins-Minerals (MULTIVITAMIN ADULTS) TABS Take 1 tablet by mouth daily.   Yes [provider]     No Known Allergies     Objective:  Physical Exam  Constitutional: She is oriented to person, place, and time. She appears well-developed and well-nourished. She is active and cooperative. No distress.  BP (!) 146/86   Pulse 77   Temp 98.6 F (37 C) (Oral)   Resp 16  Ht 5\' 2"  (1.575 m)   Wt 154 lb (69.9 kg)   SpO2 98%   BMI 28.17 kg/m   HENT:  Head: Normocephalic and atraumatic.  Right Ear: Hearing normal.  Left Ear: Hearing normal.  Eyes: Conjunctivae are normal. No scleral icterus.  Neck: Normal range of motion. Neck supple. No thyromegaly present.  Cardiovascular: Normal rate and regular rhythm.   Murmur heard.  Systolic murmur is present with a grade of 2/6  Pulses:      Radial pulses are 2+ on the right side, and 2+ on the left side.  Murmur heard in the aortic space.  Pulmonary/Chest: Effort normal and breath sounds normal.  Lymphadenopathy:       Head (right side): No tonsillar, no preauricular, no posterior auricular and no  occipital adenopathy present.       Head (left side): No tonsillar, no preauricular, no posterior auricular and no occipital adenopathy present.    She has no cervical adenopathy.       Right: No supraclavicular adenopathy present.       Left: No supraclavicular adenopathy present.  Neurological: She is alert and oriented to person, place, and time. She has normal strength. No sensory deficit. GCS eye subscore is 4. GCS verbal subscore is 5. GCS motor subscore is 6.  Skin: Skin is warm, dry and intact. No rash noted. No cyanosis or erythema. Nails show no clubbing.  Psychiatric: She has a normal mood and affect. Her speech is normal and behavior is normal.       Assessment & Plan:   Problem List Items Addressed This Visit    Undiagnosed cardiac murmurs    Referral to cardiology for additional evaluation      Relevant Orders   Ambulatory referral to Cardiology    Other Visit Diagnoses    Acute nonintractable headache, unspecified headache type    -  Primary   Description of thunderclap type headache is concerning. Resolved. No neurologic deficits. CT scan today.   Relevant Orders   CT Head Wo Contrast       Return for re-evaluation, TBD by CT results.   Fernande Bras, PA-C Primary Care at [facility]

## 2017-03-03 NOTE — Telephone Encounter (Signed)
Phone number for Tillie Rung at [facility] is [phone] ext. [phone]. [facility] scheduling is [phone].

## 2017-03-03 NOTE — Telephone Encounter (Signed)
Received message left from Cameron at [facility] pre-cert center at 70:92HV that pt was coming in for a CT and insurance required pre-auth. I was able to listen to voicemail at a little after 2pm and started on auth at that time by calling [employer] at 2:22pm. [facility] required additional clinical review and said to fax over notes and the turn around time would be 4 hours. I called Tillie Rung back and left a message at 2:59pm to let her know this because pt was scheduled for CT at 3:30pm. I then called [facility] Scheduling to see if I could notify someone there about this to try and push the appt to a later time when auth was received. I was told the CT had already been performed. I am still waiting to hear back to see if [facility] approved the request but wanted to notate this in the event of any issues.

## 2017-03-03 NOTE — Progress Notes (Signed)
Subjective:    Patient ID: Brandi Harrington, female    DOB: 1946-03-29, 71 y.o.   MRN: [id number] PCP: Harrison Mons, PA-C Chief Complaint  Patient presents with  . Headache    On MOnday pt felt a sharp pain on the right side of her head that radiated to the left side - pt was seen in the ER     HPI: 71 y/o F presents today complaining of a sharp pain that she felt in the right side of her head on 03/01/2017 while talking to her daughter. She states that it felt like "someone smacked her up the side of the head" and then the pain radiated to the left, and give her blurry vision. It only lasted a few seconds. Also stated that it "felt like tingling and then just left". She went to [facility] ED that night just before midnight, so that they could take pictures of her head, complaining of the headache on the right side of her head. There she told the triage nurse that she had a history of vertigo but was not experiencing it at that time. She left the ED before being evaluated because she had to go to work at 5 am (6/12).  She went to [facility] ED on 6/11 just before midnight, complaining of HA on right side of her head. She told the triage nurse that she does have a history of vertigo but was not experiencing any at that time. She had to go to work at 5am (on 6/12) and therefore left the ED before being evaluated.   Today in the office she denies headache but states that her vertigo is worse than normal. Denies nausea or vomiting.  Patient Active Problem List   Diagnosis Date Noted  . Undiagnosed cardiac murmurs 03/03/2017  . DDD (degenerative disc disease), cervical 11/17/2016  . Unilateral hearing loss, right 10/20/2016  . Vertigo 10/20/2016  . Tinnitus, right ear 09/12/2016  . BMI 29.0-29.9,adult 06/25/2016  . Facial paresthesia 02/26/2015  . Lipoma of back 02/26/2015  . Tobacco use 01/18/2015  . Hepatitis C antibody test positive 02/15/2013  . HTN (hypertension) 05/12/2012  .  Hyperlipidemia 05/12/2012  . Osteopenia   . TMJ syndrome   . Vitamin D deficiency    Past Medical History:  Diagnosis Date  . Hyperlipidemia   . Hypertension   . Osteopenia   . TMJ syndrome   . Vertigo 09/14/2016  . Vitamin D deficiency    Prior to Admission medications   Medication Sig Start Date End Date Taking? Authorizing Provider  alendronate (FOSAMAX) 70 MG tablet TAKE ONE TABLET BY MOUTH ONCE A WEEK WITH A FULL GLASS OF WATER ON AN EMPTY STOMACH 06/25/16  Yes Jeffery, Chelle, PA-C  amLODipine (NORVASC) 10 MG tablet Take 1 tablet (10 mg total) by mouth daily. 03/03/16  Yes Harrison Mons, PA-C  aspirin EC 81 MG tablet Take 81 mg by mouth daily.   Yes [provider]  cholecalciferol (VITAMIN D) 1000 units tablet Take 1,000 Units by mouth every evening.   Yes [provider]  fish oil-omega-3 fatty acids 1000 MG capsule Take 2 g by mouth daily.   Yes [provider]  ketoconazole (NIZORAL) 2 % cream Apply 1 application topically 2 (two) times daily. 02/23/17  Yes Jeffery, Chelle, PA-C  lovastatin (MEVACOR) 40 MG tablet Take 1 tablet (40 mg total) by mouth daily. 03/03/16  Yes Jeffery, Chelle, PA-C  meclizine (ANTIVERT) 25 MG tablet Take 1 tablet (  25 mg total) by mouth 3 (three) times daily as needed for dizziness. 09/29/16  Yes Jeffery, Chelle, PA-C  meloxicam (MOBIC) 15 MG tablet Take 1 tablet (15 mg total) by mouth daily. 02/23/17  Yes Jeffery, Chelle, PA-C  Multiple Vitamins-Minerals (MULTIVITAMIN ADULTS) TABS Take 1 tablet by mouth daily.   Yes [provider]   No Known Allergies    Review of Systems  Constitutional: Negative for chills and fever.  HENT: Negative.   Eyes: Positive for visual disturbance.  Respiratory: Negative.  Negative for cough, chest tightness and shortness of breath.   Cardiovascular: Negative.  Negative for chest pain and palpitations.  Gastrointestinal: Negative.  Negative for abdominal pain, blood in stool,  constipation, diarrhea, nausea and vomiting.  Endocrine: Negative.   Genitourinary: Negative for dysuria, frequency, hematuria and urgency.  Musculoskeletal: Negative.   Skin: Negative.   Allergic/Immunologic: Negative.   Neurological: Positive for dizziness. Negative for speech difficulty and weakness.  Hematological: Negative.   Psychiatric/Behavioral: Negative.  Negative for sleep disturbance.       Objective:   Physical Exam  Constitutional: She is oriented to person, place, and time. She appears well-developed and well-nourished. No distress.  BP (!) 146/86   Pulse 77   Temp 98.6 F (37 C) (Oral)   Resp 16   Ht 5\' 2"  (1.575 m)   Wt 154 lb (69.9 kg)   SpO2 98%   BMI 28.17 kg/m    HENT:  Head: Normocephalic and atraumatic.  Neck: Normal range of motion. Neck supple. No thyromegaly present.  Cardiovascular: Normal rate, regular rhythm and intact distal pulses.   Murmur (systolic best heard over right 2nd intercostal space.) heard. Pulmonary/Chest: Effort normal and breath sounds normal. No respiratory distress.  Lymphadenopathy:    She has no cervical adenopathy.  Neurological: She is alert and oriented to person, place, and time.  Skin: Skin is warm and dry. She is not diaphoretic.  Psychiatric: She has a normal mood and affect. Her behavior is normal. Judgment and thought content normal.       Assessment & Plan:  1. Acute nonintractable headache, unspecified headache type New onset, sudden headache. Evaluate for SAH.  - CT Head Wo Contrast; Future  2. Undiagnosed cardiac murmurs New murmur. Was not heard by provider at previous visit on 02/23/2017.  - Ambulatory referral to Cardiology  Follow up pending results of CT and please see cardiology.

## 2017-03-03 NOTE — Patient Instructions (Addendum)
  CT of head @ [facility] is to go to main entrance and check in at desk, let them know what she is there for and they will direct her to Radiology. patient is a work in so there will be a wait   IF you received an x-ray today, you will receive an invoice from [facility] Radiology. Please contact [facility]iology at [phone] with questions or concerns regarding your invoice.   IF you received labwork today, you will receive an invoice from [place]. Please contact [facility] at [phone] with questions or concerns regarding your invoice.   Our billing staff will not be able to assist you with questions regarding bills from these companies.  You will be contacted with the lab results as soon as they are available. The fastest way to get your results is to activate your My Chart account. Instructions are located on the last page of this paperwork. If you have not heard from [place] regarding the results in 2 weeks, please contact this office.

## 2017-03-03 NOTE — Telephone Encounter (Signed)
Left message on VM that CT scan is NORMAL.

## 2017-03-03 NOTE — Assessment & Plan Note (Signed)
Referral to cardiology for additional evaluation

## 2017-03-04 ENCOUNTER — Telehealth: Payer: Self-pay | Admitting: Physician Assistant

## 2017-03-04 NOTE — Telephone Encounter (Signed)
Authorization received via fax from [facility] for pt's CT W/O Contrast performed on 6/13 as STAT. The authorization number for this is [id number] and is valid from 03/03/17-06/01/17.

## 2017-03-04 NOTE — Telephone Encounter (Signed)
error 

## 2017-03-06 DIAGNOSIS — I10 Essential (primary) hypertension: Secondary | ICD-10-CM | POA: Diagnosis not present

## 2017-03-06 DIAGNOSIS — E785 Hyperlipidemia, unspecified: Secondary | ICD-10-CM | POA: Diagnosis not present

## 2017-03-06 DIAGNOSIS — I639 Cerebral infarction, unspecified: Secondary | ICD-10-CM | POA: Diagnosis not present

## 2017-03-06 DIAGNOSIS — R42 Dizziness and giddiness: Secondary | ICD-10-CM | POA: Diagnosis not present

## 2017-03-06 NOTE — Telephone Encounter (Signed)
Chelle , please call this patient. Thanks.

## 2017-03-08 ENCOUNTER — Other Ambulatory Visit: Payer: Self-pay | Admitting: Physician Assistant

## 2017-03-08 DIAGNOSIS — I10 Essential (primary) hypertension: Secondary | ICD-10-CM

## 2017-03-08 NOTE — Telephone Encounter (Signed)
This message is from 6 days ago. The patient presented to the ED,but left without being seen. I then saw her in the office on 03/03/2017. As such, this message is no longer relevant.

## 2017-03-26 ENCOUNTER — Telehealth: Payer: Self-pay | Admitting: Family Medicine

## 2017-03-26 NOTE — Telephone Encounter (Signed)
Called patient to advise colonoscopy she is overdue, her record shows it was due on 03/2016. Patient will schedule.

## 2017-03-26 NOTE — Telephone Encounter (Signed)
PT CALLING BACK STATING THAT SHE DOESN'T REMEMBER WHEN SHE HAD A COLONOSCOPY BUT HAS HAD A SCREENING FOR COLON CANCER

## 2017-04-07 ENCOUNTER — Other Ambulatory Visit: Payer: Self-pay | Admitting: Physician Assistant

## 2017-04-07 DIAGNOSIS — Z1231 Encounter for screening mammogram for malignant neoplasm of breast: Secondary | ICD-10-CM

## 2017-04-21 ENCOUNTER — Ambulatory Visit
Admission: RE | Admit: 2017-04-21 | Discharge: 2017-04-21 | Disposition: A | Discharge status: Home / Self Care | Payer: Medicare HMO | Source: Ambulatory Visit | Attending: Physician Assistant | Admitting: Physician Assistant

## 2017-04-21 DIAGNOSIS — Z1231 Encounter for screening mammogram for malignant neoplasm of breast: Secondary | ICD-10-CM

## 2017-05-06 ENCOUNTER — Encounter: Payer: Self-pay | Admitting: Physician Assistant

## 2017-05-07 ENCOUNTER — Other Ambulatory Visit: Payer: Self-pay | Admitting: Physician Assistant

## 2017-05-07 DIAGNOSIS — E785 Hyperlipidemia, unspecified: Secondary | ICD-10-CM

## 2017-05-21 DIAGNOSIS — E785 Hyperlipidemia, unspecified: Secondary | ICD-10-CM | POA: Diagnosis not present

## 2017-05-21 DIAGNOSIS — R42 Dizziness and giddiness: Secondary | ICD-10-CM | POA: Diagnosis not present

## 2017-05-21 DIAGNOSIS — I639 Cerebral infarction, unspecified: Secondary | ICD-10-CM | POA: Diagnosis not present

## 2017-05-21 DIAGNOSIS — I10 Essential (primary) hypertension: Secondary | ICD-10-CM | POA: Diagnosis not present

## 2017-05-21 DIAGNOSIS — M1990 Unspecified osteoarthritis, unspecified site: Secondary | ICD-10-CM | POA: Diagnosis not present

## 2017-05-21 DIAGNOSIS — K219 Gastro-esophageal reflux disease without esophagitis: Secondary | ICD-10-CM | POA: Diagnosis not present

## 2017-06-02 ENCOUNTER — Other Ambulatory Visit: Payer: Self-pay | Admitting: Physician Assistant

## 2017-06-02 DIAGNOSIS — M5030 Other cervical disc degeneration, unspecified cervical region: Secondary | ICD-10-CM

## 2017-06-02 DIAGNOSIS — M8580 Other specified disorders of bone density and structure, unspecified site: Secondary | ICD-10-CM

## 2017-06-08 DIAGNOSIS — E785 Hyperlipidemia, unspecified: Secondary | ICD-10-CM | POA: Diagnosis not present

## 2017-06-08 DIAGNOSIS — I10 Essential (primary) hypertension: Secondary | ICD-10-CM | POA: Diagnosis not present

## 2017-07-27 ENCOUNTER — Ambulatory Visit (INDEPENDENT_AMBULATORY_CARE_PROVIDER_SITE_OTHER): Payer: Medicare HMO

## 2017-07-27 VITALS — BP 120/82 | HR 78 | Temp 97.8°F | Ht 62.0 in | Wt 153.5 lb

## 2017-07-27 DIAGNOSIS — Z23 Encounter for immunization: Secondary | ICD-10-CM

## 2017-07-27 DIAGNOSIS — Z1211 Encounter for screening for malignant neoplasm of colon: Secondary | ICD-10-CM

## 2017-07-27 DIAGNOSIS — Z0000 Encounter for general adult medical examination without abnormal findings: Secondary | ICD-10-CM | POA: Diagnosis not present

## 2017-07-27 NOTE — Progress Notes (Signed)
Subjective:   Brandi Harrington is a 71 y.o. female who presents for Medicare Annual (Subsequent) preventive examination.  Review of Systems:  N/A Cardiac Risk Factors include: advanced age (>33men, >18 women);dyslipidemia;hypertension;smoking/ tobacco exposure     Objective:     Vitals: BP 120/82   Pulse 78   Temp 97.8 F (36.6 C) (Oral)   Ht 5\' 2"  (1.575 m)   Wt 153 lb 8 oz (69.6 kg)   SpO2 99%   BMI 28.08 kg/m   Body mass index is 28.08 kg/m.   Tobacco Social History   Tobacco Use  Smoking Status Current Some Day Smoker  . Packs/day: 0.30  . Types: Cigarettes  Smokeless Tobacco Never Used  Tobacco Comment   may smoke 1 cigarette in the evening after work     Ready to quit: No Counseling given: Not Answered Comment: may smoke 1 cigarette in the evening after work   Past Medical History:  Diagnosis Date  . Hyperlipidemia   . Hypertension   . Osteopenia   . TMJ syndrome   . Vertigo 09/14/2016  . Vitamin D deficiency    Past Surgical History:  Procedure Laterality Date  . TUBAL LIGATION     Family History  Problem Relation Age of Onset  . Diabetes Sister   . Scoliosis Sister   . Arthritis Sister   . Alcohol abuse Daughter   . Breast cancer Daughter   . Heart disease Son        dysrrhythmia   Social History   Substance and Sexual Activity  Sexual Activity Yes  . Partners: Male  . Birth control/protection: Post-menopausal    Outpatient Encounter Medications as of 07/27/2017  Medication Sig  . alendronate (FOSAMAX) 70 MG tablet TAKE ONE TABLET BY MOUTH ONCE A WEEK WITH  A  FULL  GLASS  OF  WATER  ON  AN  EMPTY  STOMACH  . amLODipine (NORVASC) 10 MG tablet TAKE ONE TABLET BY MOUTH ONCE DAILY  . aspirin EC 81 MG tablet Take 81 mg by mouth daily.  . cholecalciferol (VITAMIN D) 1000 units tablet Take 1,000 Units by mouth every evening.  . fish oil-omega-3 fatty acids 1000 MG capsule Take 2 g by mouth daily.  Marland Kitchen ketoconazole (NIZORAL) 2 % cream Apply 1  application topically 2 (two) times daily.  Marland Kitchen lovastatin (MEVACOR) 40 MG tablet TAKE ONE TABLET BY MOUTH ONCE DAILY  . meclizine (ANTIVERT) 25 MG tablet Take 1 tablet (25 mg total) by mouth 3 (three) times daily as needed for dizziness.  . meloxicam (MOBIC) 15 MG tablet TAKE 1 TABLET BY MOUTH ONCE DAILY  . Multiple Vitamins-Minerals (MULTIVITAMIN ADULTS) TABS Take 1 tablet by mouth daily.   No facility-administered encounter medications on file as of 07/27/2017.     Activities of Daily Living In your present state of health, do you have any difficulty performing the following activities: 07/27/2017  Hearing? N  Vision? N  Difficulty concentrating or making decisions? Y  Comment some concentrating issues at times  Walking or climbing stairs? N  Dressing or bathing? N  Doing errands, shopping? N  Preparing Food and eating ? N  Using the Toilet? N  In the past six months, have you accidently leaked urine? N  Do you have problems with loss of bowel control? N  Managing your Medications? N  Managing your Finances? N  Housekeeping or managing your Housekeeping? N  Some recent data might be hidden    Patient Care  Team: Harrison Mons, PA-C as PCP - General (Family Medicine) Charolette Forward, MD as Consulting Physician (Cardiology)    Assessment:     Exercise Activities and Dietary recommendations Current Exercise Habits: The patient does not participate in regular exercise at present, Exercise limited by: None identified  Goals    . Exercise 3x per week (30 min per time)     Patient states that she wants to try to start back exercising on her treadmill.       Fall Risk Fall Risk  07/27/2017 03/03/2017 02/23/2017 09/29/2016 09/01/2016  Falls in the past year? No No No No No   Depression Screen PHQ 2/9 Scores 07/27/2017 03/03/2017 02/23/2017 10/30/2016  PHQ - 2 Score 0 0 0 4  PHQ- 9 Score - - - 4     Cognitive Function     6CIT Screen 07/27/2017  What Year? 0 points  What month? 0  points  What time? 0 points  Count back from 20 0 points  Months in reverse 0 points  Repeat phrase 6 points  Total Score 6    Immunization History  Administered Date(s) Administered  . Hepatitis B, adult 02/26/2015, 03/28/2015, 09/16/2015  . Influenza,inj,Quad PF,6+ Mos 08/24/2013, 06/10/2014, 06/18/2015, 06/25/2016, 07/27/2017  . Pneumococcal Conjugate-13 06/10/2014  . Pneumococcal Polysaccharide-23 03/03/2016  . Zoster 02/15/2013   Screening Tests Health Maintenance  Topic Date Due  . COLONOSCOPY  03/21/2016  . TETANUS/TDAP  02/23/2018 (Originally 02/15/1965)  . MAMMOGRAM  04/22/2019  . INFLUENZA VACCINE  Completed  . DEXA SCAN  Completed  . Hepatitis C Screening  Completed  . PNA vac Low Risk Adult  Completed      Plan:   I have personally reviewed and noted the following in the patient's chart:   . Medical and social history . Use of alcohol, tobacco or illicit drugs  . Current medications and supplements . Functional ability and status . Nutritional status . Physical activity . Advanced directives . List of other physicians . Hospitalizations, surgeries, and ER visits in previous 12 months . Vitals . Screenings to include cognitive, depression, and falls . Referrals and appointments  In addition, I have reviewed and discussed with patient certain preventive protocols, quality metrics, and best practice recommendations. A written personalized care plan for preventive services as well as general preventive health recommendations were provided to patient.   1. Encounter for Medicare annual wellness exam  2. Special screening for malignant neoplasms, colon - Ambulatory referral to Gastroenterology (for Colonoscopy)  3. Need for immunization against influenza - Flu Vaccine QUAD 6+ mos IM (Fluarix)  Andrez Grime, LPN  16/09/958

## 2017-07-27 NOTE — Patient Instructions (Addendum)
Brandi Harrington , Thank you for taking time to come for your Medicare Wellness Visit. I appreciate your ongoing commitment to your health goals. Please review the following plan we discussed and let me know if I can assist you in the future.   Screening recommendations/referrals: Colonoscopy: due, [facility] will contact you about setting this up Mammogram: up to date, next due 04/22/2019 Bone Density: up to date, next due 09/23/2021 Recommended yearly ophthalmology/optometry visit for glaucoma screening and checkup Recommended yearly dental visit for hygiene and checkup  Vaccinations: Influenza vaccine: administered today  Pneumococcal vaccine: up to date Tdap vaccine: declined due to insurance Shingles vaccine: up to date    Advanced directives: Advance directive discussed with you today. I have provided a copy for you to complete at home and have notarized. Once this is complete please bring a copy in to our office so we can scan it into your chart.   Conditions/risks identified: Try to start back exercising on your treadmill  Next appointment: schedule follow up with PCP, 1 year for AWV   Preventive Care 65 Years and Older, Female Preventive care refers to lifestyle choices and visits with your health care provider that can promote health and wellness. What does preventive care include?  A yearly physical exam. This is also called an annual well check.  Dental exams once or twice a year.  Routine eye exams. Ask your health care provider how often you should have your eyes checked.  Personal lifestyle choices, including:  Daily care of your teeth and gums.  Regular physical activity.  Eating a healthy diet.  Avoiding tobacco and drug use.  Limiting alcohol use.  Practicing safe sex.  Taking low-dose aspirin every day.  Taking vitamin and mineral supplements as recommended by your health care provider. What happens during an annual well check? The services and screenings  done by your health care provider during your annual well check will depend on your age, overall health, lifestyle risk factors, and family history of disease. Counseling  Your health care provider may ask you questions about your:  Alcohol use.  Tobacco use.  Drug use.  Emotional well-being.  Home and relationship well-being.  Sexual activity.  Eating habits.  History of falls.  Memory and ability to understand (cognition).  Work and work Statistician.  Reproductive health. Screening  You may have the following tests or measurements:  Height, weight, and BMI.  Blood pressure.  Lipid and cholesterol levels. These may be checked every 5 years, or more frequently if you are over 39 years old.  Skin check.  Lung cancer screening. You may have this screening every year starting at age 61 if you have a 30-pack-year history of smoking and currently smoke or have quit within the past 15 years.  Fecal occult blood test (FOBT) of the stool. You may have this test every year starting at age 32.  Flexible sigmoidoscopy or colonoscopy. You may have a sigmoidoscopy every 5 years or a colonoscopy every 10 years starting at age 35.  Hepatitis C blood test.  Hepatitis B blood test.  Sexually transmitted disease (STD) testing.  Diabetes screening. This is done by checking your blood sugar (glucose) after you have not eaten for a while (fasting). You may have this done every 1-3 years.  Bone density scan. This is done to screen for osteoporosis. You may have this done starting at age 71.  Mammogram. This may be done every 1-2 years. Talk to your health care provider  about how often you should have regular mammograms. Talk with your health care provider about your test results, treatment options, and if necessary, the need for more tests. Vaccines  Your health care provider may recommend certain vaccines, such as:  Influenza vaccine. This is recommended every year.  Tetanus,  diphtheria, and acellular pertussis (Tdap, Td) vaccine. You may need a Td booster every 10 years.  Zoster vaccine. You may need this after age 28.  Pneumococcal 13-valent conjugate (PCV13) vaccine. One dose is recommended after age 36.  Pneumococcal polysaccharide (PPSV23) vaccine. One dose is recommended after age 77. Talk to your health care provider about which screenings and vaccines you need and how often you need them. This information is not intended to replace advice given to you by your health care provider. Make sure you discuss any questions you have with your health care provider. Document Released: 10/04/2015 Document Revised: 05/27/2016 Document Reviewed: 07/09/2015 Elsevier Interactive Patient Education  2017 [place] Prevention in the Home Falls can cause injuries. They can happen to people of all ages. There are many things you can do to make your home safe and to help prevent falls. What can I do on the outside of my home?  Regularly fix the edges of walkways and driveways and fix any cracks.  Remove anything that might make you trip as you walk through a door, such as a raised step or threshold.  Trim any bushes or trees on the path to your home.  Use bright outdoor lighting.  Clear any walking paths of anything that might make someone trip, such as rocks or tools.  Regularly check to see if handrails are loose or broken. Make sure that both sides of any steps have handrails.  Any raised decks and porches should have guardrails on the edges.  Have any leaves, snow, or ice cleared regularly.  Use sand or salt on walking paths during winter.  Clean up any spills in your garage right away. This includes oil or grease spills. What can I do in the bathroom?  Use night lights.  Install grab bars by the toilet and in the tub and shower. Do not use towel bars as grab bars.  Use non-skid mats or decals in the tub or shower.  If you need to sit down in  the shower, use a plastic, non-slip stool.  Keep the floor dry. Clean up any water that spills on the floor as soon as it happens.  Remove soap buildup in the tub or shower regularly.  Attach bath mats securely with double-sided non-slip rug tape.  Do not have throw rugs and other things on the floor that can make you trip. What can I do in the bedroom?  Use night lights.  Make sure that you have a light by your bed that is easy to reach.  Do not use any sheets or blankets that are too big for your bed. They should not hang down onto the floor.  Have a firm chair that has side arms. You can use this for support while you get dressed.  Do not have throw rugs and other things on the floor that can make you trip. What can I do in the kitchen?  Clean up any spills right away.  Avoid walking on wet floors.  Keep items that you use a lot in easy-to-reach places.  If you need to reach something above you, use a strong step stool that has a grab bar.  Keep electrical cords out of the way.  Do not use floor polish or wax that makes floors slippery. If you must use wax, use non-skid floor wax.  Do not have throw rugs and other things on the floor that can make you trip. What can I do with my stairs?  Do not leave any items on the stairs.  Make sure that there are handrails on both sides of the stairs and use them. Fix handrails that are broken or loose. Make sure that handrails are as long as the stairways.  Check any carpeting to make sure that it is firmly attached to the stairs. Fix any carpet that is loose or worn.  Avoid having throw rugs at the top or bottom of the stairs. If you do have throw rugs, attach them to the floor with carpet tape.  Make sure that you have a light switch at the top of the stairs and the bottom of the stairs. If you do not have them, ask someone to add them for you. What else can I do to help prevent falls?  Wear shoes that:  Do not have high  heels.  Have rubber bottoms.  Are comfortable and fit you well.  Are closed at the toe. Do not wear sandals.  If you use a stepladder:  Make sure that it is fully opened. Do not climb a closed stepladder.  Make sure that both sides of the stepladder are locked into place.  Ask someone to hold it for you, if possible.  Clearly mark and make sure that you can see:  Any grab bars or handrails.  First and last steps.  Where the edge of each step is.  Use tools that help you move around (mobility aids) if they are needed. These include:  Canes.  Walkers.  Scooters.  Crutches.  Turn on the lights when you go into a dark area. Replace any light bulbs as soon as they burn out.  Set up your furniture so you have a clear path. Avoid moving your furniture around.  If any of your floors are uneven, fix them.  If there are any pets around you, be aware of where they are.  Review your medicines with your doctor. Some medicines can make you feel dizzy. This can increase your chance of falling. Ask your doctor what other things that you can do to help prevent falls. This information is not intended to replace advice given to you by your health care provider. Make sure you discuss any questions you have with your health care provider. Document Released: 07/04/2009 Document Revised: 02/13/2016 Document Reviewed: 10/12/2014 Elsevier Interactive Patient Education  2017 [employer].

## 2017-08-14 ENCOUNTER — Other Ambulatory Visit: Payer: Self-pay | Admitting: Physician Assistant

## 2017-08-14 DIAGNOSIS — M5030 Other cervical disc degeneration, unspecified cervical region: Secondary | ICD-10-CM

## 2017-08-14 DIAGNOSIS — E785 Hyperlipidemia, unspecified: Secondary | ICD-10-CM

## 2017-08-16 NOTE — Telephone Encounter (Signed)
Last OV 03/03/17. Refill request of Lovastatin and Meloxicam.

## 2017-08-17 ENCOUNTER — Encounter: Payer: Self-pay | Admitting: Physician Assistant

## 2017-08-17 ENCOUNTER — Other Ambulatory Visit: Payer: Self-pay

## 2017-08-17 ENCOUNTER — Ambulatory Visit: Payer: Medicare HMO | Admitting: Physician Assistant

## 2017-08-17 VITALS — BP 110/80 | HR 85 | Temp 98.2°F | Resp 18 | Ht 62.0 in | Wt 151.8 lb

## 2017-08-17 DIAGNOSIS — R011 Cardiac murmur, unspecified: Secondary | ICD-10-CM

## 2017-08-17 DIAGNOSIS — Z720 Tobacco use: Secondary | ICD-10-CM

## 2017-08-17 DIAGNOSIS — R42 Dizziness and giddiness: Secondary | ICD-10-CM | POA: Diagnosis not present

## 2017-08-17 DIAGNOSIS — E785 Hyperlipidemia, unspecified: Secondary | ICD-10-CM | POA: Diagnosis not present

## 2017-08-17 DIAGNOSIS — M5030 Other cervical disc degeneration, unspecified cervical region: Secondary | ICD-10-CM | POA: Diagnosis not present

## 2017-08-17 DIAGNOSIS — Z1211 Encounter for screening for malignant neoplasm of colon: Secondary | ICD-10-CM

## 2017-08-17 DIAGNOSIS — I10 Essential (primary) hypertension: Secondary | ICD-10-CM | POA: Diagnosis not present

## 2017-08-17 MED ORDER — LOVASTATIN 40 MG PO TABS: 40.0000 mg | ORAL_TABLET | Freq: Every day | ORAL | 1 refills | Status: DC

## 2017-08-17 MED ORDER — MELOXICAM 15 MG PO TABS: 15.0000 mg | ORAL_TABLET | Freq: Every day | ORAL | 5 refills | Status: DC

## 2017-08-17 NOTE — Assessment & Plan Note (Signed)
This is sounding more like lightheadedness, not true vertigo. No benefit with meclizine nor oral prednisone. MRI negative for acoustic neuroma. Wonder if aortic stenosis may be causing symptom.

## 2017-08-17 NOTE — Assessment & Plan Note (Signed)
While this may cause tension HA, doubt this is related to the symptoms that she is describing of something moving in her head. She'll try to observe whether meloxicam is helpful.

## 2017-08-17 NOTE — Assessment & Plan Note (Signed)
Well controlled. Continue current treatment. 

## 2017-08-17 NOTE — Assessment & Plan Note (Signed)
Has been well controlled on Mevacor. Await labs. Adjust regimen as indicated by results.

## 2017-08-17 NOTE — Assessment & Plan Note (Signed)
Smoking cessation encouraged!

## 2017-08-17 NOTE — Assessment & Plan Note (Addendum)
referred 02/2017 when first noted, but she did not schedule. Referral re-submitted.

## 2017-08-17 NOTE — Progress Notes (Signed)
Patient ID: Brandi Harrington, female    DOB: [birth year], 71 y.o.   MRN: [id number]  PCP: Porfirio Oar, PA-C  Chief Complaint  Patient presents with  . Hypertension  . Hyperlipidemia  . Headache  . Follow-up  . Medication Refill    Meloxicam 15 MG, Lovastatin 40 MG    Subjective:   Presents for evaluation of HTN, hyperlipidemia and headache/dizziness.  In general, she's doing well, no new problems. Tolerating her medications well. Continues to be dizzy. Different from the vertigo she was feeling beginning 06/2016. Upon chart review, she was initially experiencing lightheadedness with associated tinnitus and neck pain, not spinning sensations. She was seen here and in the ED on several occasions and was diagnosed in the ED with AOM. Referral made to ENT, and evaluation revealed high-frequency sensorineural hearing loss. MRI revealed no acoustic neuroma, and she declined steroid therapy. MRI also noted Grade 1 C3-4 degenerative anterolisthesis. Meclizine wasn't helpful. She ultimately tried the prednisone in 10/2016.   At follow-up 02/2017, the neck pain and dizziness were intermittent and bothersome, but not such that she was interested in additional evaluation or treatment. She did agree to try meloxicam, but hasn't noticed whether or not it is helpful.  HA persists, associated with neck pain. "Like something moving in the head. Head feels like it weighs 100 lbs." Had some vision change this morning when got stressed (she was behind a garbage truck and it was too slow).  Hasn't been able to schedule her colonoscopy because she couldn't remember the date of her last colonoscopy (more specific than "10 years"). Our record indicates a reported date of 03/2006.   LEFT thumb soreness and stiffness this morning when she awoke. No trauma or injury. RIGHT hand dominant.  Has run out of lovastatin and meloxicam (x 2 days), pharmacy hasn't contacted her that they are ready. Waiting on my  authorization.   Review of Systems As above. No CP, SOB, GU/GI symptoms, other muscle or joint pain, rash.    Patient Active Problem List   Diagnosis Date Noted  . Undiagnosed cardiac murmurs 03/03/2017  . DDD (degenerative disc disease), cervical 11/17/2016  . Unilateral hearing loss, right 10/20/2016  . Vertigo 10/20/2016  . Tinnitus, right ear 09/12/2016  . BMI 29.0-29.9,adult 06/25/2016  . Facial paresthesia 02/26/2015  . Lipoma of back 02/26/2015  . Tobacco use 01/18/2015  . Hepatitis C antibody test positive 02/15/2013  . HTN (hypertension) 05/12/2012  . Hyperlipidemia 05/12/2012  . Osteopenia   . TMJ syndrome   . Vitamin D deficiency      Prior to Admission medications   Medication Sig Start Date End Date Taking? Authorizing Provider  alendronate (FOSAMAX) 70 MG tablet TAKE ONE TABLET BY MOUTH ONCE A WEEK WITH  A  FULL  GLASS  OF  WATER  ON  AN  EMPTY  STOMACH 06/02/17  Yes Analis Distler, PA-C  amLODipine (NORVASC) 10 MG tablet TAKE ONE TABLET BY MOUTH ONCE DAILY 03/09/17  Yes Leotis Shames, Tamecca Artiga, PA-C  aspirin EC 81 MG tablet Take 81 mg by mouth daily.   Yes [provider]  cholecalciferol (VITAMIN D) 1000 units tablet Take 1,000 Units by mouth every evening.   Yes [provider]  fish oil-omega-3 fatty acids 1000 MG capsule Take 2 g by mouth daily.   Yes [provider]  ketoconazole (NIZORAL) 2 % cream Apply 1 application topically 2 (two) times daily. 02/23/17  Yes Kenyana Husak, PA-C  lovastatin (MEVACOR) 40  MG tablet TAKE ONE TABLET BY MOUTH ONCE DAILY 05/10/17  Yes Deztiny Sarra, PA-C  meclizine (ANTIVERT) 25 MG tablet Take 1 tablet (25 mg total) by mouth 3 (three) times daily as needed for dizziness. 09/29/16  Yes Lakyia Behe, PA-C  meloxicam (MOBIC) 15 MG tablet TAKE 1 TABLET BY MOUTH ONCE DAILY 06/02/17  Yes Laasia Arcos, PA-C  Multiple Vitamins-Minerals (MULTIVITAMIN ADULTS) TABS Take 1 tablet by mouth daily.   Yes [provider]     No Known Allergies     Objective:  Physical Exam  Constitutional: She is oriented to person, place, and time. She appears well-developed and well-nourished. She is active and cooperative. No distress.  BP 110/80 (BP Location: Left Arm, Patient Position: Sitting, Cuff Size: Large)   Pulse 85   Temp 98.2 F (36.8 C) (Oral)   Resp 18   Ht 5\' 2"  (1.575 m)   Wt 151 lb 12.8 oz (68.9 kg)   SpO2 100%   BMI 27.76 kg/m   HENT:  Head: Normocephalic and atraumatic.  Right Ear: Hearing normal.  Left Ear: Hearing normal.  Eyes: Conjunctivae are normal. No scleral icterus.  Neck: Normal range of motion. Neck supple. No thyromegaly present.  Cardiovascular: Normal rate and regular rhythm.  Murmur heard.  Systolic murmur is present with a grade of 2/6. Pulses:      Radial pulses are 2+ on the right side, and 2+ on the left side.  Pulmonary/Chest: Effort normal and breath sounds normal.  Lymphadenopathy:       Head (right side): No tonsillar, no preauricular, no posterior auricular and no occipital adenopathy present.       Head (left side): No tonsillar, no preauricular, no posterior auricular and no occipital adenopathy present.    She has no cervical adenopathy.       Right: No supraclavicular adenopathy present.       Left: No supraclavicular adenopathy present.  Neurological: She is alert and oriented to person, place, and time. No sensory deficit.  Skin: Skin is warm, dry and intact. No rash noted. No cyanosis or erythema. Nails show no clubbing.  Psychiatric: She has a normal mood and affect. Her speech is normal and behavior is normal.        Assessment & Plan:   Problem List Items Addressed This Visit    HTN (hypertension) - Primary (Chronic)    Well controlled. Continue current treatment.      Relevant Medications   lovastatin (MEVACOR) 40 MG tablet   Other Relevant Orders   CBC with Differential/Platelet   Comprehensive metabolic panel    Hyperlipidemia (Chronic)    Has been well controlled on Mevacor. Await labs. Adjust regimen as indicated by results.      Relevant Medications   lovastatin (MEVACOR) 40 MG tablet   Other Relevant Orders   Comprehensive metabolic panel   Lipid panel   Tobacco use    Smoking cessation encouraged.      Vertigo    This is sounding more like lightheadedness, not true vertigo. No benefit with meclizine nor oral prednisone. MRI negative for acoustic neuroma. Wonder if aortic stenosis may be causing symptom.      Relevant Orders   Ambulatory referral to Cardiology   DDD (degenerative disc disease), cervical    While this may cause tension HA, doubt this is related to the symptoms that she is describing of something moving in her head. She'll try to observe whether meloxicam is helpful.  Relevant Medications   meloxicam (MOBIC) 15 MG tablet   Undiagnosed cardiac murmurs    referred 02/2017 when first noted, but she did not schedule. Referral re-submitted.      Relevant Orders   Ambulatory referral to Cardiology    Other Visit Diagnoses    Screening for colon cancer       Will pull paper record to determine exact date of previous colonoscopy.       Return in about 2 months (around 10/17/2017) for re-evaluation of dizziness, headaches.   Fernande Bras, PA-C [facility] at [facility]

## 2017-08-17 NOTE — Patient Instructions (Signed)
     IF you received an x-ray today, you will receive an invoice from [facility]. Please contact [facility] at [phone] with questions or concerns regarding your invoice.   IF you received labwork today, you will receive an invoice from [facility]. Please contact [facility] at [phone] with questions or concerns regarding your invoice.   Our billing staff will not be able to assist you with questions regarding bills from these companies.  You will be contacted with the lab results as soon as they are available. The fastest way to get your results is to activate your My Chart account. Instructions are located on the last page of this paperwork. If you have not heard from us regarding the results in 2 weeks, please contact this office.     

## 2017-08-18 LAB — CBC WITH DIFFERENTIAL/PLATELET
BASOS ABS: 0 10*3/uL (ref 0.0–0.2)
Basos: 1 %
EOS (ABSOLUTE): 0.5 10*3/uL — AB (ref 0.0–0.4)
Eos: 7 %
Hematocrit: 38.8 % (ref 34.0–46.6)
Hemoglobin: 12.8 g/dL (ref 11.1–15.9)
Immature Grans (Abs): 0 10*3/uL (ref 0.0–0.1)
Immature Granulocytes: 0 %
LYMPHS ABS: 2.5 10*3/uL (ref 0.7–3.1)
Lymphs: 37 %
MCH: 31.7 pg (ref 26.6–33.0)
MCHC: 33 g/dL (ref 31.5–35.7)
MCV: 96 fL (ref 79–97)
MONOS ABS: 0.4 10*3/uL (ref 0.1–0.9)
Monocytes: 6 %
NEUTROS ABS: 3.3 10*3/uL (ref 1.4–7.0)
Neutrophils: 49 %
Platelets: 345 10*3/uL (ref 150–379)
RBC: 4.04 x10E6/uL (ref 3.77–5.28)
RDW: 13.9 % (ref 12.3–15.4)
WBC: 6.8 10*3/uL (ref 3.4–10.8)

## 2017-08-18 LAB — COMPREHENSIVE METABOLIC PANEL
ALK PHOS: 73 IU/L (ref 39–117)
ALT: 33 IU/L — AB (ref 0–32)
AST: 39 IU/L (ref 0–40)
Albumin/Globulin Ratio: 1.8 (ref 1.2–2.2)
Albumin: 4.6 g/dL (ref 3.6–4.8)
BILIRUBIN TOTAL: 0.3 mg/dL (ref 0.0–1.2)
BUN / CREAT RATIO: 16 (ref 12–28)
BUN: 16 mg/dL (ref 8–27)
CHLORIDE: 105 mmol/L (ref 96–106)
CO2: 23 mmol/L (ref 20–29)
Calcium: 9.6 mg/dL (ref 8.7–10.3)
Creatinine, Ser: 0.98 mg/dL (ref 0.57–1.00)
GFR calc Af Amer: 68 mL/min/{1.73_m2} (ref >59)
GFR calc non Af Amer: 59 mL/min/{1.73_m2} — ABNORMAL LOW (ref >59)
GLUCOSE: 82 mg/dL (ref 65–99)
Globulin, Total: 2.6 g/dL (ref 1.5–4.5)
Potassium: 4.2 mmol/L (ref 3.5–5.2)
Sodium: 144 mmol/L (ref 134–144)
Total Protein: 7.2 g/dL (ref 6.0–8.5)

## 2017-08-18 LAB — LIPID PANEL
CHOLESTEROL TOTAL: 240 mg/dL — AB (ref 100–199)
Chol/HDL Ratio: 2.8 ratio (ref 0.0–4.4)
HDL: 87 mg/dL (ref >39)
LDL Calculated: 139 mg/dL — ABNORMAL HIGH (ref 0–99)
Triglycerides: 70 mg/dL (ref 0–149)
VLDL CHOLESTEROL CAL: 14 mg/dL (ref 5–40)

## 2017-08-26 ENCOUNTER — Encounter: Payer: Self-pay | Admitting: Gastroenterology

## 2017-09-06 ENCOUNTER — Telehealth: Payer: Self-pay | Admitting: *Deleted

## 2017-09-06 NOTE — Telephone Encounter (Signed)
Pt advised regarding labwork on cholesterol. She stated that she take her medication everyday.

## 2017-09-10 NOTE — Telephone Encounter (Signed)
I think that we need to change the choelsterol medication to one that is stronger. STOP the Mevacor (lovastatin). START atorvastatin (order is pended. Once you notify patient, please sign).

## 2017-09-16 ENCOUNTER — Other Ambulatory Visit: Payer: Self-pay

## 2017-09-17 MED ORDER — ATORVASTATIN CALCIUM 40 MG PO TABS: 40.0000 mg | ORAL_TABLET | Freq: Every day | ORAL | 3 refills | Status: DC

## 2017-09-17 NOTE — Telephone Encounter (Signed)
Pt.notified

## 2017-10-07 ENCOUNTER — Ambulatory Visit (AMBULATORY_SURGERY_CENTER): Payer: Self-pay

## 2017-10-07 VITALS — Ht 61.0 in | Wt 154.6 lb

## 2017-10-07 DIAGNOSIS — Z1211 Encounter for screening for malignant neoplasm of colon: Secondary | ICD-10-CM

## 2017-10-07 NOTE — Progress Notes (Signed)
Per pt, no allergies to soy or egg products.Pt not taking any weight loss meds or using  O2 at home.  Pt refused emmi video. 

## 2017-10-18 ENCOUNTER — Encounter: Payer: Self-pay | Admitting: Gastroenterology

## 2017-10-18 ENCOUNTER — Ambulatory Visit (AMBULATORY_SURGERY_CENTER): Payer: Medicare HMO | Admitting: Gastroenterology

## 2017-10-18 VITALS — BP 134/68 | HR 60 | Temp 97.8°F | Resp 17 | Ht 61.0 in | Wt 154.0 lb

## 2017-10-18 DIAGNOSIS — Z1212 Encounter for screening for malignant neoplasm of rectum: Secondary | ICD-10-CM

## 2017-10-18 DIAGNOSIS — K635 Polyp of colon: Secondary | ICD-10-CM

## 2017-10-18 DIAGNOSIS — I10 Essential (primary) hypertension: Secondary | ICD-10-CM | POA: Diagnosis not present

## 2017-10-18 DIAGNOSIS — D123 Benign neoplasm of transverse colon: Secondary | ICD-10-CM | POA: Diagnosis not present

## 2017-10-18 DIAGNOSIS — Z1211 Encounter for screening for malignant neoplasm of colon: Secondary | ICD-10-CM | POA: Diagnosis present

## 2017-10-18 MED ORDER — SODIUM CHLORIDE 0.9 % IV SOLN: 500.0000 mL | Freq: Once | INTRAVENOUS | Status: DC

## 2017-10-18 NOTE — Progress Notes (Signed)
No problems noted in the recovery room. maw 

## 2017-10-18 NOTE — Op Note (Signed)
[facility] Patient Name: Brandi Harrington Procedure Date: 10/18/2017 1:48 PM MRN: [id number] Endoscopist: [place]. Loletha Carrow , MD Age: 72 Referring MD:  Date of Birth: 1946-07-31 Gender: Female Account #: [id number] Procedure:                Colonoscopy Indications:              Screening for colorectal malignant neoplasm (no                            polyps on last colonoscopy > 10 years ago) Medicines:                Monitored Anesthesia Care Procedure:                Pre-Anesthesia Assessment:                           - Prior to the procedure, a History and Physical                            was performed, and patient medications and                            allergies were reviewed. The patient's tolerance of                            previous anesthesia was also reviewed. The risks                            and benefits of the procedure and the sedation                            options and risks were discussed with the patient.                            All questions were answered, and informed consent                            was obtained. Anticoagulants: The patient has taken                            aspirin. It was decided not to withhold this                            medication prior to the procedure. ASA Grade                            Assessment: II - A patient with mild systemic                            disease. After reviewing the risks and benefits,                            the patient was deemed in satisfactory condition to  undergo the procedure.                           After obtaining informed consent, the colonoscope                            was passed under direct vision. Throughout the                            procedure, the patient's blood pressure, pulse, and                            oxygen saturations were monitored continuously. The                            Model PCF-H190DL [id number]) scope was  introduced                            through the anus and advanced to the the cecum,                            identified by appendiceal orifice and ileocecal                            valve. The colonoscopy was performed without                            difficulty. The patient tolerated the procedure                            well. The quality of the bowel preparation was                            good. The ileocecal valve, appendiceal orifice, and                            rectum were photographed. Scope In: 1:55:16 PM Scope Out: 2:09:53 PM Scope Withdrawal Time: 0 hours 9 minutes 10 seconds  Total Procedure Duration: 0 hours 14 minutes 37 seconds  Findings:                 The perianal and digital rectal examinations were                            normal.                           A 5 mm polyp was found in the transverse colon. The                            polyp was sessile. The polyp was removed with a                            cold snare. Resection and retrieval were complete.  The sigmoid colon was redundant.                           The exam was otherwise without abnormality on                            direct and retroflexion views. Complications:            No immediate complications. Estimated Blood Loss:     Estimated blood loss was minimal. Impression:               - One 5 mm polyp in the transverse colon, removed                            with a cold snare. Resected and retrieved.                           - Redundant colon.                           - The examination was otherwise normal on direct                            and retroflexion views. Recommendation:           - Patient has a contact number available for                            emergencies. The signs and symptoms of potential                            delayed complications were discussed with the                            patient. Return to normal activities  tomorrow.                            Written discharge instructions were provided to the                            patient.                           - Resume previous diet.                           - Continue present medications.                           - Await pathology results.                           - Repeat colonoscopy is recommended for                            surveillance. The colonoscopy date will be  determined after pathology results from today's                            exam become available for review. Luba Matzen L. Loletha Carrow, MD 10/18/2017 2:13:23 PM This report has been signed electronically.

## 2017-10-18 NOTE — Progress Notes (Signed)
I have reviewed the patient's medical history in detail and updated the computerized patient record.

## 2017-10-18 NOTE — Patient Instructions (Signed)
YOU HAD AN ENDOSCOPIC PROCEDURE TODAY AT [facility]:   Refer to the procedure report that was given to you for any specific questions about what was found during the examination.  If the procedure report does not answer your questions, please call your gastroenterologist to clarify.  If you requested that your care partner not be given the details of your procedure findings, then the procedure report has been included in a sealed envelope for you to review at your convenience later.  YOU SHOULD EXPECT: Some feelings of bloating in the abdomen. Passage of more gas than usual.  Walking can help get rid of the air that was put into your GI tract during the procedure and reduce the bloating. If you had a lower endoscopy (such as a colonoscopy or flexible sigmoidoscopy) you may notice spotting of blood in your stool or on the toilet paper. If you underwent a bowel prep for your procedure, you may not have a normal bowel movement for a few days.  Please Note:  You might notice some irritation and congestion in your nose or some drainage.  This is from the oxygen used during your procedure.  There is no need for concern and it should clear up in a day or so.  SYMPTOMS TO REPORT IMMEDIATELY:   Following lower endoscopy (colonoscopy or flexible sigmoidoscopy):  Excessive amounts of blood in the stool  Significant tenderness or worsening of abdominal pains  Swelling of the abdomen that is new, acute  Fever of 100F or higher   For urgent or emergent issues, a gastroenterologist can be reached at any hour by calling [phone].   DIET:  We do recommend a small meal at first, but then you may proceed to your regular diet.  Drink plenty of fluids but you should avoid alcoholic beverages for 24 hours.  ACTIVITY:  You should plan to take it easy for the rest of today and you should NOT DRIVE or use heavy machinery until tomorrow (because of the sedation medicines used during the test).     FOLLOW UP: Our staff will call the number listed on your records the next business day following your procedure to check on you and address any questions or concerns that you may have regarding the information given to you following your procedure. If we do not reach you, we will leave a message.  However, if you are feeling well and you are not experiencing any problems, there is no need to return our call.  We will assume that you have returned to your regular daily activities without incident.  If any biopsies were taken you will be contacted by phone or by letter within the next 1-3 weeks.  Please call us at [phone] if you have not heard about the biopsies in 3 weeks.    SIGNATURES/CONFIDENTIALITY: You and/or your care partner have signed paperwork which will be entered into your electronic medical record.  These signatures attest to the fact that that the information above on your After Visit Summary has been reviewed and is understood.  Full responsibility of the confidentiality of this discharge information lies with you and/or your care-partner.    Handoutswas given to your care partner on polyps. You may resume your current medications today. Await biopsy results. Please call if any questions or concerns.

## 2017-10-18 NOTE — Progress Notes (Signed)
To recovery, report to RN, VSS. 

## 2017-10-18 NOTE — Progress Notes (Signed)
Called to room to assist during endoscopic procedure.  Patient ID and intended procedure confirmed with present staff. Received instructions for my participation in the procedure from the performing physician.  

## 2017-10-19 ENCOUNTER — Ambulatory Visit (INDEPENDENT_AMBULATORY_CARE_PROVIDER_SITE_OTHER): Payer: Medicare HMO | Admitting: Physician Assistant

## 2017-10-19 ENCOUNTER — Telehealth: Payer: Self-pay

## 2017-10-19 ENCOUNTER — Encounter: Payer: Self-pay | Admitting: Physician Assistant

## 2017-10-19 VITALS — BP 120/72 | HR 96 | Temp 98.5°F | Resp 18 | Ht 61.0 in | Wt 150.4 lb

## 2017-10-19 DIAGNOSIS — R011 Cardiac murmur, unspecified: Secondary | ICD-10-CM

## 2017-10-19 DIAGNOSIS — M25552 Pain in left hip: Secondary | ICD-10-CM | POA: Diagnosis not present

## 2017-10-19 DIAGNOSIS — Z720 Tobacco use: Secondary | ICD-10-CM

## 2017-10-19 DIAGNOSIS — M26629 Arthralgia of temporomandibular joint, unspecified side: Secondary | ICD-10-CM

## 2017-10-19 DIAGNOSIS — R42 Dizziness and giddiness: Secondary | ICD-10-CM | POA: Diagnosis not present

## 2017-10-19 DIAGNOSIS — I10 Essential (primary) hypertension: Secondary | ICD-10-CM

## 2017-10-19 NOTE — Progress Notes (Signed)
Subjective:    Patient ID: Brandi Harrington, female    DOB: Apr 23, 1946, 72 y.o.   MRN: [id number]  Chief Complaint  Patient presents with  . Dizziness    Pt states the dizziness still happens.  . Headache    Pt states she is more concern about the vertigo and isn't so much a headache as in pressur when she has the dizziness.  . Follow-up   Presents today for follow-up on dizziness.  Last see for HTN, vertigo, and undiagnosed murmur on 08/17/17. BP has been well controlled.  Monitors BP at home. SPB is usually around 120-130. Gets up to 140 when she is "hurting." Tolerating her medications well, with no adverse effects. She has been cutting back on smoking.   She continues to be dizzy.  Upon chart review, in 2017 she had multiple visits for lightheadedness with associated tinnitus in the ED and at this office. CT brain performed 06/28/2016 and notable for "calcification of the major vessels at the base of the brain, common at this age." Carotid artery duplex scan 07/10/2016 "Bilateral internal carotid artery velocities suggest a <40% stenosis." 09/14/16, diagnosed with AOM in ED. Referral was made to ENT and saw Dr. Constance Holster on 10/20/16. Evaluation revealed high-frequency sensorineural hearing loss.  MRI on 10/29/16 revealed no acoustic neuroma, and she declined steroid therapy. MRI also noted Grade 1 C3-4 degenerative anterolisthesis. Meclizine wasn't helpful. She tried prednisone in 10/2016. 03/03/17, she was referred to cardiology for undiagnosed murmur and CT scan was ordered due to description of a thunderclap headache. CT scan results 03/03/17 showed: "No acute intracranial findings. No intracranial mass, hemorrhage or edema. Mild ethmoid sinus disease, of uncertain chronicity. Possible nasal polyps, incompletely imaged." At last visit 08/17/17, re-referred to cardiology to r/o aortic stenosis causing dizziness.  She never made an appointment with the cardiologist. Dizziness persists. Head feels like  its spinning. Denies any triggers, specific times the dizziness occurs, room spinning sensation, nausea, or vomiting. Takes Meclizine qAM, with no improvement. Some days are worse than others. "Today is not a good one." She says she doesn't get upset by it anymore.   Left ear pain x 2 months. Hurts when she opens her jaw. No tinnitus or changes in hearing. Her tooth filling recently came out so she is making an appointment with the dentist. Reports a popping when she opens her jaw.   Notes pain in her left upper thigh. Hurts on and off. She is on her feet a lot with work (daycare). She has stopped exercising due to the pain.   Review of Systems  Constitutional: Negative for chills, fever and unexpected weight change.  HENT: Negative for hearing loss.   Eyes: Negative for visual disturbance.  Respiratory: Negative for cough, chest tightness and shortness of breath.   Cardiovascular: Negative for chest pain, palpitations and leg swelling.  Gastrointestinal: Negative for nausea and vomiting.   Patient Active Problem List   Diagnosis Date Noted  . Undiagnosed cardiac murmurs 03/03/2017  . DDD (degenerative disc disease), cervical 11/17/2016  . Unilateral hearing loss, right 10/20/2016  . Vertigo 10/20/2016  . Tinnitus, right ear 09/12/2016  . BMI 29.0-29.9,adult 06/25/2016  . Facial paresthesia 02/26/2015  . Lipoma of back 02/26/2015  . Tobacco use 01/18/2015  . Hepatitis C antibody test positive 02/15/2013  . HTN (hypertension) 05/12/2012  . Hyperlipidemia 05/12/2012  . Osteopenia   . TMJ syndrome   . Vitamin D deficiency    Prior to Admission medications  Medication Sig Start Date End Date Taking? Authorizing Provider  alendronate (FOSAMAX) 70 MG tablet TAKE ONE TABLET BY MOUTH ONCE A WEEK WITH  A  FULL  GLASS  OF  WATER  ON  AN  EMPTY  STOMACH 06/02/17  Yes Jeffery, Chelle, PA-C  amLODipine (NORVASC) 10 MG tablet TAKE ONE TABLET BY MOUTH ONCE DAILY 03/09/17  Yes Jeffery, Chelle, PA-C   Ascorbic Acid (VITAMIN C) 1000 MG tablet Take 1,000 mg by mouth daily.   Yes [provider]  aspirin EC 81 MG tablet Take 81 mg by mouth daily.   Yes [provider]  atorvastatin (LIPITOR) 40 MG tablet Take 1 tablet (40 mg total) by mouth daily. 09/17/17  Yes Jeffery, Chelle, PA-C  bisacodyl (DULCOLAX) 5 MG EC tablet Take 5 mg by mouth. Dulcolax 5 mg bowel prep #4-Take as directed   Yes [provider]  cholecalciferol (VITAMIN D) 1000 units tablet Take 1,000 Units by mouth every evening.   Yes [provider]  fish oil-omega-3 fatty acids 1000 MG capsule Take 1,200 mg by mouth daily.    Yes [provider]  lovastatin (MEVACOR) 40 MG tablet Take 40 mg by mouth at bedtime.   Yes [provider]  meclizine (ANTIVERT) 25 MG tablet Take 1 tablet (25 mg total) by mouth 3 (three) times daily as needed for dizziness. 09/29/16  Yes Jeffery, Chelle, PA-C  meloxicam (MOBIC) 15 MG tablet Take 1 tablet (15 mg total) by mouth daily. 08/17/17  Yes Jeffery, Chelle, PA-C  Multiple Vitamins-Minerals (MULTIVITAMIN ADULTS) TABS Take 1 tablet by mouth daily.   Yes [provider]  ketoconazole (NIZORAL) 2 % cream Apply 1 application topically 2 (two) times daily. Patient not taking: Reported on 10/07/2017 02/23/17   Harrison Mons, PA-C   No Known Allergies     Objective:   Physical Exam  Constitutional: She is oriented to person, place, and time. She appears well-developed and well-nourished. No distress.  HENT:  Head: Normocephalic.    Right Ear: Tympanic membrane, external ear and ear canal normal.  Left Ear: Tympanic membrane, external ear and ear canal normal.  Nose: Nose normal. No mucosal edema.  Mouth/Throat: Uvula is midline and oropharynx is clear and moist.  Eyes: Conjunctivae are normal. Pupils are equal, round, and reactive to light.  Neck: No JVD present.  Cardiovascular: Normal rate and regular rhythm. Exam reveals no gallop and  no friction rub.  Murmur: Mumur was not appreciated. Pulses:      Radial pulses are 2+ on the right side, and 2+ on the left side.       Posterior tibial pulses are 2+ on the right side, and 2+ on the left side.  Pulmonary/Chest: Effort normal and breath sounds normal. No respiratory distress. She has no wheezes. She has no rales. She exhibits no tenderness.  Musculoskeletal: She exhibits no edema.       Left hip: She exhibits tenderness (tenderness with full flexion). She exhibits normal range of motion (full flexion, internal rotation, and external rotation), no swelling and no deformity.  Lymphadenopathy:       Head (right side): No submental, no submandibular, no tonsillar, no preauricular, no posterior auricular and no occipital adenopathy present.       Head (left side): No submental, no submandibular, no tonsillar, no preauricular, no posterior auricular and no occipital adenopathy present.    She has no cervical adenopathy.  Neurological: She is alert and oriented to person, place, and time. Coordination  and gait normal.  Psychiatric: She has a normal mood and affect. Her behavior is normal.   Blood pressure 120/72, pulse 96, temperature 98.5 F (36.9 C), temperature source Oral, resp. rate 18, height 5\' 1"  (1.549 m), weight 150 lb 6.4 oz (68.2 kg), SpO2 98 %.    Assessment & Plan:  1. Essential hypertension Well controlled. Continue current medications.  2. Vertigo Unclear etiology. Persistent. No benefit with meclizine nor oral prednisone. MRI (10/2016) negative for acoustic neuroma. Considering underlying cause due to TMJ dysfunction, aortic stenosis, or other cardiac murmur. Instructed to follow-up with dentist regarding TMJ. Re-referred to cardiologist. Instructed to make an appointment with the cardiologist when they call. If dizziness and tinnitus are not improved with treatment offered by dentist and cardiologist says murmur is unrelated, plan to refer to ENT specialist.   3.  Undiagnosed cardiac murmurs Referred on 02/2017 when the murmur was first reported and again on 08/17/17, but patient did not schedule. Referral re-submitted. Explained the purpose and importance of scheduling an appointment with cardiologist.  - Ambulatory referral to Cardiology  4. TMJ syndrome Popping of left TMJ evident on physical exam. Follow-up with dentist regarding TMJ dysfunction. TMJ could be causing dizziness and tinnitus.   5. Left hip pain Unclear etiology of pain. No limitations in ADLs, gait normal. Plan to X-ray left hip at next visit. Tylenol can be used as needed. Instructed to start walking on the treadmill again. Follow-up sooner if pain worsens.   6. Tobacco use Continue efforts to quit smoking.   Return in about 4 months (around 02/16/2018) for re-evalutation of blood pressure, cholesterol, hip pain.  Noemi Chapel, PA-S

## 2017-10-19 NOTE — Progress Notes (Signed)
Patient ID: Brandi Harrington, female    DOB: November 14, 1945, 72 y.o.   MRN: [id number]  PCP: Porfirio Oar, PA-C  Chief Complaint  Patient presents with  . Dizziness    Pt states the dizziness still happens.  . Headache    Pt states she is more concern about the vertigo and isn't so much a headache as in pressur when she has the dizziness.  . Follow-up    Subjective:   Presents for evaluation of dizziness.  Dizziness persists. BP has been well controlled, SBP 120-130, unless she is hurting and then it is 140. Tolerating her medication well and is cutting back on smoking.  The dizziness dates back to fall of 2017. CT brain revealed changes consistent with age. Carotid duplex scan revealed bilateral internal carotid artery stenosis about 40%. ENT evaluation revealed sensorineural hearing loss bilaterally. MRI was negative for acoustic neuroma and sheinitially declined steroid treatment. Later, steroid treatment was ineffective. MRI also revealed grade 1 degenerative anterolisthesis at C3-4. Meclizine hasn't helped. She was referred to cardiology x 2 (most recently 07/2017) because of new cardiac murmur, thought possibly due to aortic stenosis, but did not schedule. New onset thunderclap headache evaluated with head CT 03/03/2017 revealed mild ethmoid sinus disease and possible nasal polyps.  Describes the dizziness as the room spinning. No triggers identified. She has stopped driving, but otherwise has not changed her lifestyle and no longer gets upset by the dizziness.  New symptom of LEFT ear pain x 2 months. Associated with a filling falling out of a tooth on the same side, ans she has scheduled with her dentist. Has popping in the LEFT jaw.  LEFT upper thigh pain intermittently has stopped her from exercising.   Review of Systems As above. Constitutional: Negative for chills, fever and unexpected weight change.  HENT: Negative for hearing loss.   Eyes: Negative for visual  disturbance.  Respiratory: Negative for cough, chest tightness and shortness of breath.   Cardiovascular: Negative for chest pain, palpitations and leg swelling.  Gastrointestinal: Negative for nausea and vomiting.       Patient Active Problem List   Diagnosis Date Noted  . Undiagnosed cardiac murmurs 03/03/2017  . DDD (degenerative disc disease), cervical 11/17/2016  . Unilateral hearing loss, right 10/20/2016  . Vertigo 10/20/2016  . Tinnitus, right ear 09/12/2016  . BMI 29.0-29.9,adult 06/25/2016  . Facial paresthesia 02/26/2015  . Lipoma of back 02/26/2015  . Tobacco use 01/18/2015  . Hepatitis C antibody test positive 02/15/2013  . HTN (hypertension) 05/12/2012  . Hyperlipidemia 05/12/2012  . Osteopenia   . TMJ syndrome   . Vitamin D deficiency      Prior to Admission medications   Medication Sig Start Date End Date Taking? Authorizing Provider  alendronate (FOSAMAX) 70 MG tablet TAKE ONE TABLET BY MOUTH ONCE A WEEK WITH  A  FULL  GLASS  OF  WATER  ON  AN  EMPTY  STOMACH 06/02/17  Yes Otniel Hoe, PA-C  amLODipine (NORVASC) 10 MG tablet TAKE ONE TABLET BY MOUTH ONCE DAILY 03/09/17  Yes Haunani Dickard, PA-C  Ascorbic Acid (VITAMIN C) 1000 MG tablet Take 1,000 mg by mouth daily.   Yes [provider]  aspirin EC 81 MG tablet Take 81 mg by mouth daily.   Yes [provider]  atorvastatin (LIPITOR) 40 MG tablet Take 1 tablet (40 mg total) by mouth daily. 09/17/17  Yes Vang Kraeger, PA-C  bisacodyl (DULCOLAX) 5 MG EC tablet Take  5 mg by mouth. Dulcolax 5 mg bowel prep #4-Take as directed   Yes [provider]  cholecalciferol (VITAMIN D) 1000 units tablet Take 1,000 Units by mouth every evening.   Yes [provider]  fish oil-omega-3 fatty acids 1000 MG capsule Take 1,200 mg by mouth daily.    Yes [provider]  lovastatin (MEVACOR) 40 MG tablet Take 40 mg by mouth at bedtime.   Yes [provider]  meclizine  (ANTIVERT) 25 MG tablet Take 1 tablet (25 mg total) by mouth 3 (three) times daily as needed for dizziness. 09/29/16  Yes Kinjal Neitzke, PA-C  meloxicam (MOBIC) 15 MG tablet Take 1 tablet (15 mg total) by mouth daily. 08/17/17  Yes Tomy Khim, PA-C  Multiple Vitamins-Minerals (MULTIVITAMIN ADULTS) TABS Take 1 tablet by mouth daily.   Yes [provider]  ketoconazole (NIZORAL) 2 % cream Apply 1 application topically 2 (two) times daily. Patient not taking: Reported on 10/07/2017 02/23/17   Porfirio Oar, PA-C     No Known Allergies     Objective:  Physical Exam  Constitutional: She is oriented to person, place, and time. She appears well-developed and well-nourished. She is active and cooperative. No distress.  BP 120/72 (BP Location: Left Arm, Patient Position: Sitting, Cuff Size: Normal)   Pulse 96   Temp 98.5 F (36.9 C) (Oral)   Resp 18   Ht 5\' 1"  (1.549 m)   Wt 150 lb 6.4 oz (68.2 kg)   SpO2 98%   BMI 28.42 kg/m   HENT:  Head: Normocephalic and atraumatic.  Right Ear: Hearing, tympanic membrane, external ear and ear canal normal.  Left Ear: Hearing, tympanic membrane, external ear and ear canal normal.  Nose: Nose normal.  Mouth/Throat: Oropharynx is clear and moist and mucous membranes are normal. No oropharyngeal exudate.  LEFT TMJ is tender on palpation and pops with ROM.  Eyes: Conjunctivae are normal. No scleral icterus.  Neck: Normal range of motion. Neck supple. No thyromegaly present.  Cardiovascular: Normal rate, regular rhythm and normal heart sounds.  Pulses:      Radial pulses are 2+ on the right side, and 2+ on the left side.  Pulmonary/Chest: Effort normal and breath sounds normal.  Musculoskeletal:       Left hip: She exhibits normal range of motion, normal strength, no tenderness, no bony tenderness, no swelling, no crepitus, no deformity and no laceration.       Cervical back: Normal.       Thoracic back: Normal.       Lumbar back: Normal.    Pain with full flexion of the hip.  Lymphadenopathy:       Head (right side): No tonsillar, no preauricular, no posterior auricular and no occipital adenopathy present.       Head (left side): No tonsillar, no preauricular, no posterior auricular and no occipital adenopathy present.    She has no cervical adenopathy.       Right: No supraclavicular adenopathy present.       Left: No supraclavicular adenopathy present.  Neurological: She is alert and oriented to person, place, and time. No sensory deficit.  Skin: Skin is warm, dry and intact. No rash noted. No cyanosis or erythema. Nails show no clubbing.  Psychiatric: She has a normal mood and affect. Her speech is normal and behavior is normal.           Assessment & Plan:   Problem List Items Addressed This Visit  HTN (hypertension) - Primary (Chronic)    Well controlled. Continue current treatment.      TMJ syndrome    Ask her dentist to evaluate this at her upcoming visit. She may benefit from a mouth guard.      Tobacco use    Encouraged continued efforts toward smoking cessation.      Vertigo    Etiology remains unclear. Again advised of recommendation for cardiology evaluation. Plan evaluation with neurology if cardiology does not reveal cause.      Undiagnosed cardiac murmurs   Relevant Orders   Ambulatory referral to Cardiology    Other Visit Diagnoses    Left hip pain       declines additional evaluation today. Plan radiographs if persists at next visit, sooner if pain worsens.       Return in about 4 months (around 02/16/2018) for re-evalutation of blood pressure, cholesterol, hip pain.   Fernande Bras, PA-C [facility]

## 2017-10-19 NOTE — Patient Instructions (Addendum)
1. When you see your dentist, please tell them about the TMJ. They may have treatment to offer. 2. When the cardiology office calls, please schedule with them! 3. If the dizziness and ringing are not improved with the treatment offered by your dentist and the cardiologist says that the murmur is unrelated to your dizziness, we'll plan to refer you to seen an ENT specialist. 4. We'll plan to xray your hip at your next visit, unless it gets worse in the meantime and I need to see you about it sooner. Try getting back on the treadmill, as it may help.  Keep up the great work cutting back on smoking!    IF you received an x-ray today, you will receive an invoice from [facility]. Please contact [facility] at [phone] with questions or concerns regarding your invoice.   IF you received labwork today, you will receive an invoice from [place]. Please contact [facility] at [phone] with questions or concerns regarding your invoice.   Our billing staff will not be able to assist you with questions regarding bills from these companies.  You will be contacted with the lab results as soon as they are available. The fastest way to get your results is to activate your My Chart account. Instructions are located on the last page of this paperwork. If you have not heard from [place] regarding the results in 2 weeks, please contact this office.

## 2017-10-19 NOTE — Telephone Encounter (Signed)
  Follow up Call-  Call back number 10/18/2017  Post procedure Call Back phone  # [phone]  Permission to leave phone message Yes  Some recent data might be hidden     Patient questions:  Do you have a fever, pain , or abdominal swelling? No. Pain Score  0 *  Have you tolerated food without any problems? Yes.    Have you been able to return to your normal activities? Yes.    Do you have any questions about your discharge instructions: Diet   No. Medications  No. Follow up visit  No.  Do you have questions or concerns about your Care? No.  Actions: * If pain score is 4 or above: No action needed, pain <4.

## 2017-10-22 ENCOUNTER — Encounter: Payer: Self-pay | Admitting: Gastroenterology

## 2017-10-23 NOTE — Assessment & Plan Note (Signed)
Ask her dentist to evaluate this at her upcoming visit. She may benefit from a mouth guard.

## 2017-10-23 NOTE — Assessment & Plan Note (Signed)
Etiology remains unclear. Again advised of recommendation for cardiology evaluation. Plan evaluation with neurology if cardiology does not reveal cause.

## 2017-10-23 NOTE — Assessment & Plan Note (Signed)
Encouraged continued efforts toward smoking cessation. 

## 2017-10-23 NOTE — Assessment & Plan Note (Signed)
Well controlled. Continue current treatment. 

## 2017-11-09 ENCOUNTER — Ambulatory Visit (INDEPENDENT_AMBULATORY_CARE_PROVIDER_SITE_OTHER): Payer: Medicare HMO

## 2017-11-09 ENCOUNTER — Encounter: Payer: Self-pay | Admitting: Physician Assistant

## 2017-11-09 ENCOUNTER — Ambulatory Visit (INDEPENDENT_AMBULATORY_CARE_PROVIDER_SITE_OTHER): Payer: Medicare HMO | Admitting: Physician Assistant

## 2017-11-09 VITALS — BP 148/85 | HR 80 | Temp 98.5°F | Resp 16 | Ht 61.0 in | Wt 155.0 lb

## 2017-11-09 DIAGNOSIS — M25552 Pain in left hip: Secondary | ICD-10-CM | POA: Diagnosis not present

## 2017-11-09 DIAGNOSIS — M545 Low back pain, unspecified: Secondary | ICD-10-CM

## 2017-11-09 HISTORY — DX: Pain in left hip: M25.552

## 2017-11-09 NOTE — Progress Notes (Signed)
Subjective:    Patient ID: Brandi Harrington, female    DOB: 10-21-1945, 72 y.o.   MRN: [id number]  HPI  Chief Complaint  Patient presents with  . Hip Pain    left 3 weeks   Patient is reporting with left hip pain x 3 weeks. She does not recall any event triggering the hip pain. She works at a [employer] but does not do heavy lifting in order to avoid injury. She does not recall lifting anything, or injuring herself in any way prior to the start of her hip pain. Her hip pain is severe enough to interfere with her gait, causing a right leg-favoring limp. She does not recall experiencing hip pain like this before. She now also has pain on the left side of her back in the mid-thoracic region. Standing and walking exacerbates the hip pain. Sitting relieves the hip pain. She has not taken any medication in an attempt to alleviate her symptoms. Her blood pressure is higher today (148/85 mm Hg,) likely due to the severity of her pain. Her left ear pain and dizziness are persistent. She has not scheduled an appointment with the cardiologist (to evaluate her new murmur,) or the dentist (to evaluate her TMJ dysfunction.)  Patient Active Problem List   Diagnosis Date Noted  . Undiagnosed cardiac murmurs 03/03/2017  . DDD (degenerative disc disease), cervical 11/17/2016  . Unilateral hearing loss, right 10/20/2016  . Vertigo 10/20/2016  . Tinnitus, right ear 09/12/2016  . BMI 29.0-29.9,adult 06/25/2016  . Facial paresthesia 02/26/2015  . Lipoma of back 02/26/2015  . Tobacco use 01/18/2015  . Hepatitis C antibody test positive 02/15/2013  . HTN (hypertension) 05/12/2012  . Hyperlipidemia 05/12/2012  . Osteopenia   . TMJ syndrome   . Vitamin D deficiency    No Known Allergies   Prior to Admission medications   Medication Sig Start Date End Date Taking? Authorizing Provider  alendronate (FOSAMAX) 70 MG tablet TAKE ONE TABLET BY MOUTH ONCE A WEEK WITH  A  FULL  GLASS  OF  WATER  ON  AN  EMPTY  STOMACH  06/02/17  Yes Jeffery, Chelle, PA-C  amLODipine (NORVASC) 10 MG tablet TAKE ONE TABLET BY MOUTH ONCE DAILY 03/09/17  Yes Jeffery, Chelle, PA-C  Ascorbic Acid (VITAMIN C) 1000 MG tablet Take 1,000 mg by mouth daily.   Yes [provider]  aspirin EC 81 MG tablet Take 81 mg by mouth daily.   Yes [provider]  atorvastatin (LIPITOR) 40 MG tablet Take 1 tablet (40 mg total) by mouth daily. 09/17/17  Yes Jeffery, Chelle, PA-C  cholecalciferol (VITAMIN D) 1000 units tablet Take 1,000 Units by mouth every evening.   Yes [provider]  fish oil-omega-3 fatty acids 1000 MG capsule Take 1,200 mg by mouth daily.    Yes [provider]  ketoconazole (NIZORAL) 2 % cream Apply 1 application topically 2 (two) times daily. 02/23/17  Yes Jeffery, Chelle, PA-C  lovastatin (MEVACOR) 40 MG tablet Take 40 mg by mouth at bedtime.   Yes [provider]  meclizine (ANTIVERT) 25 MG tablet Take 1 tablet (25 mg total) by mouth 3 (three) times daily as needed for dizziness. 09/29/16  Yes Jeffery, Chelle, PA-C  meloxicam (MOBIC) 15 MG tablet Take 1 tablet (15 mg total) by mouth daily. 08/17/17  Yes Jeffery, Chelle, PA-C  Multiple Vitamins-Minerals (MULTIVITAMIN ADULTS) TABS Take 1 tablet by mouth daily.   Yes [provider]    Past Medical  History:  Diagnosis Date  . Hyperlipidemia   . Hypertension   . Left hip pain 11/09/2017  . Osteopenia   . TMJ syndrome   . Vertigo 09/14/2016  . Vitamin D deficiency    Social History   Socioeconomic History  . Marital status: Divorced    Spouse name: n/a  . Number of children: 2  . Years of education: 12th grade  . Highest education level: Not on file  Social Needs  . Financial resource strain: Not on file  . Food insecurity - worry: Not on file  . Food insecurity - inability: Not on file  . Transportation needs - medical: Not on file  . Transportation needs - non-medical: Not on file  Occupational History  .  Occupation: Information systems manager: [employer]  Tobacco Use  . Smoking status: Current Some Day Smoker    Packs/day: 0.30    Types: Cigarettes  . Smokeless tobacco: Never Used  . Tobacco comment: may smoke 1 cigarette in the evening after work  Substance and Sexual Activity  . Alcohol use: Yes    Alcohol/week: 3.0 oz    Types: 5 Cans of beer per week  . Drug use: No  . Sexual activity: Yes    Partners: Male    Birth control/protection: Post-menopausal  Other Topics Concern  . Not on file  Social History Narrative   ** Merged History Encounter **       House on the [employer] bankruptcy (not her business)-couldn't get a loan modification.  Ex-husband s/p CVA x several, residual left hemiparesis, DM diagnosed at age 63, ASCVD (severe), s/p CABG . He's ill-humored and "mean." He moved into    an assisted living facility fall 2014, and they divorced in 10/2014.   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         Past Surgical History:  Procedure Laterality Date  . COLONOSCOPY    . TUBAL LIGATION      Review of Systems  Constitutional: Positive for activity change (Due to hip pain.). Negative for appetite change, chills, diaphoresis, fatigue, fever and unexpected weight change.  HENT: Positive for dental problem (TMJ dysfunction.), ear pain (Left-sided ear pain.) and tinnitus (Tinnitus in left ear.). Negative for congestion, drooling, ear discharge, facial swelling, hearing loss, mouth sores, nosebleeds, postnasal drip, rhinorrhea, sinus pressure, sinus pain, sneezing, sore throat, trouble swallowing and voice change.   Eyes: Negative.  Negative for visual disturbance ("I drove over here.").  Respiratory: Negative.  Negative for apnea, cough, choking, chest tightness, shortness of breath, wheezing and stridor.   Cardiovascular: Negative for chest pain,  palpitations and leg swelling.  Gastrointestinal: Negative for abdominal distention, abdominal pain, blood in stool, constipation, diarrhea, nausea and vomiting.  Musculoskeletal: Positive for arthralgias (Left hip.), back pain (Mid thoracic region.), gait problem and myalgias. Negative for neck pain and neck stiffness.  Skin: Negative.   Psychiatric/Behavioral: Negative.  Negative for confusion and sleep disturbance.       Objective:   Physical Exam  Constitutional: She is oriented to person, place, and time. She appears well-developed and well-nourished.  BP (!) 148/85 (BP Location: Right Arm, Patient Position: Sitting, Cuff Size: Small)   Pulse 80   Temp 98.5 F (36.9 C) (Oral)   Resp 16   Ht 5\' 1"  (1.549 m)   Wt 155  lb (70.3 kg)   SpO2 98%   BMI 29.29 kg/m   HENT:  Head: Normocephalic and atraumatic.  Right Ear: External ear normal.  Left Ear: External ear normal.  Nose: Nose normal.  Mouth/Throat: Oropharynx is clear and moist.  Eyes: Conjunctivae and EOM are normal. Pupils are equal, round, and reactive to light.  Neck: Normal range of motion. Neck supple.  Cardiovascular: Normal rate, regular rhythm and intact distal pulses.  Murmur (Systolic murmur heard best in right second intercostal space.) heard. Pulmonary/Chest: Effort normal and breath sounds normal.  Abdominal: Soft. Bowel sounds are normal. She exhibits no distension. There is no tenderness.  Musculoskeletal: Normal range of motion.  Pain with abduction of left hip. All other PROM normal.  Neurological: She is alert and oriented to person, place, and time. She has normal reflexes.  Gait compensation for left hip pain in the form of a limp.   Skin: Skin is warm and dry. No rash noted. No erythema.  Psychiatric: She has a normal mood and affect. Her behavior is normal. Judgment and thought content normal.    Dg Lumbar Spine Complete  Result Date: 11/09/2017 CLINICAL DATA:  72 y/o  F; acute left-sided lower  back pain. EXAM: LUMBAR SPINE - COMPLETE 4+ VIEW COMPARISON:  None. FINDINGS: Five lumbar type non-rib-bearing vertebral bodies. Normal lumbar lordosis without listhesis. Vertebral body heights are preserved. No acute fracture identified. Mild discogenic degenerative changes with loss of disc space height and anterior marginal osteophytes. Mild lower lumbar facet arthrosis. IMPRESSION: 1. No acute fracture or malalignment. 2. Mild lumbar spondylosis with multilevel disc and lower lumbar facet degenerative changes. Electronically Signed   By: Kristine Garbe M.D.   On: 11/09/2017 15:10   Dg Hip Unilat W Or W/o Pelvis 2-3 Views Left  Result Date: 11/09/2017 CLINICAL DATA:  Left hip pain EXAM: DG HIP (WITH OR WITHOUT PELVIS) 2-3V LEFT COMPARISON:  None. FINDINGS: There is no evidence of hip fracture or dislocation. There is no evidence of arthropathy or other focal bone abnormality. IMPRESSION: No acute osseous injury of the left hip. Electronically Signed   By: Kathreen Devoid   On: 11/09/2017 15:10      Assessment & Plan:   1. Acute left-sided low back pain without sciatica - DG HIP UNILAT W OR W/O PELVIS 2-3 VIEWS LEFT; Future - DG Lumbar Spine Complete; Future

## 2017-11-09 NOTE — Progress Notes (Signed)
Patient ID: Brandi Harrington, female    DOB: 10-25-1945, 72 y.o.   MRN: [id number]  PCP: Porfirio Oar, PA-C  Chief Complaint  Patient presents with  . Hip Pain    left 2 weeks    Subjective:   Presents for evaluation of severe LEFT hip pain, sudden onset, without identified trauma or injury. She mentioned this pain at her visit with me 10/19/2017, but declined evaluation at that time.  The pain causes limp with walking, and is worse with standing, walking and lifting.  She is comfortable when sitting or lying still. In addition, she has LBP and mid-thoracic LEFT sided back pain.  Unable to qualify the type of pain.  She has not scheduled a visit with her dentist or with cardiology. Dizziness, LEFT ear pain, and jaw pain persist. She has not tried any additional treatments. Meloxicam isn't helping.    Review of Systems Constitutional: Positive for activity change (Due to hip pain.). Negative for appetite change, chills, diaphoresis, fatigue, fever and unexpected weight change.  HENT: Positive for dental problem (TMJ dysfunction.), ear pain (Left-sided ear pain.) and tinnitus (Tinnitus in left ear.). Negative for congestion, drooling, ear discharge, facial swelling, hearing loss, mouth sores, nosebleeds, postnasal drip, rhinorrhea, sinus pressure, sinus pain, sneezing, sore throat, trouble swallowing and voice change.   Eyes: Negative.  Negative for visual disturbance ("I drove over here.").  Respiratory: Negative.  Negative for apnea, cough, choking, chest tightness, shortness of breath, wheezing and stridor.   Cardiovascular: Negative for chest pain, palpitations and leg swelling.  Gastrointestinal: Negative for abdominal distention, abdominal pain, blood in stool, constipation, diarrhea, nausea and vomiting.  Musculoskeletal: Positive for arthralgias (Left hip.), back pain (Mid thoracic region.), gait problem and myalgias. Negative for neck pain and neck stiffness.  Skin:  Negative.   Psychiatric/Behavioral: Negative.  Negative for confusion and sleep disturbance.       Patient Active Problem List   Diagnosis Date Noted  . Undiagnosed cardiac murmurs 03/03/2017  . DDD (degenerative disc disease), cervical 11/17/2016  . Unilateral hearing loss, right 10/20/2016  . Vertigo 10/20/2016  . Tinnitus, right ear 09/12/2016  . BMI 29.0-29.9,adult 06/25/2016  . Facial paresthesia 02/26/2015  . Lipoma of back 02/26/2015  . Tobacco use 01/18/2015  . Hepatitis C antibody test positive 02/15/2013  . HTN (hypertension) 05/12/2012  . Hyperlipidemia 05/12/2012  . Osteopenia   . TMJ syndrome   . Vitamin D deficiency      Prior to Admission medications   Medication Sig Start Date End Date Taking? Authorizing Provider  alendronate (FOSAMAX) 70 MG tablet TAKE ONE TABLET BY MOUTH ONCE A WEEK WITH  A  FULL  GLASS  OF  WATER  ON  AN  EMPTY  STOMACH 06/02/17  Yes Jaedah Lords, PA-C  amLODipine (NORVASC) 10 MG tablet TAKE ONE TABLET BY MOUTH ONCE DAILY 03/09/17  Yes Mayre Bury, PA-C  Ascorbic Acid (VITAMIN C) 1000 MG tablet Take 1,000 mg by mouth daily.   Yes [provider]  aspirin EC 81 MG tablet Take 81 mg by mouth daily.   Yes [provider]  atorvastatin (LIPITOR) 40 MG tablet Take 1 tablet (40 mg total) by mouth daily. 09/17/17  Yes Caron Ode, PA-C  bisacodyl (DULCOLAX) 5 MG EC tablet Take 5 mg by mouth. Dulcolax 5 mg bowel prep #4-Take as directed   Yes [provider]  cholecalciferol (VITAMIN D) 1000 units tablet Take 1,000 Units by mouth every evening.   Yes  [provider]  fish oil-omega-3 fatty acids 1000 MG capsule Take 1,200 mg by mouth daily.    Yes [provider]  ketoconazole (NIZORAL) 2 % cream Apply 1 application topically 2 (two) times daily. 02/23/17  Yes Ladarrell Cornwall, PA-C  lovastatin (MEVACOR) 40 MG tablet Take 40 mg by mouth at bedtime.   Yes [provider]  meclizine  (ANTIVERT) 25 MG tablet Take 1 tablet (25 mg total) by mouth 3 (three) times daily as needed for dizziness. 09/29/16  Yes Liliane Mallis, PA-C  meloxicam (MOBIC) 15 MG tablet Take 1 tablet (15 mg total) by mouth daily. 08/17/17  Yes Chenika Nevils, PA-C  Multiple Vitamins-Minerals (MULTIVITAMIN ADULTS) TABS Take 1 tablet by mouth daily.   Yes [provider]     No Known Allergies     Objective:  Physical Exam  Constitutional: She is oriented to person, place, and time. She appears well-developed and well-nourished. She is active and cooperative. No distress.  BP (!) 148/85 (BP Location: Right Arm, Patient Position: Sitting, Cuff Size: Small)   Pulse 80   Temp 98.5 F (36.9 C) (Oral)   Resp 16   Ht 5\' 1"  (1.549 m)   Wt 155 lb (70.3 kg)   SpO2 98%   BMI 29.29 kg/m   HENT:  Head: Normocephalic and atraumatic.  Right Ear: Hearing normal.  Left Ear: Hearing normal.  Eyes: Conjunctivae are normal. No scleral icterus.  Neck: Normal range of motion. Neck supple. No thyromegaly present.  Cardiovascular: Normal rate and regular rhythm.  Murmur heard.  Systolic murmur is present with a grade of 2/6. Pulses:      Radial pulses are 2+ on the right side, and 2+ on the left side.  Pulmonary/Chest: Effort normal and breath sounds normal.  Musculoskeletal:       Right hip: Normal.       Left hip: She exhibits normal range of motion, normal strength, no tenderness, no bony tenderness, no swelling, no crepitus, no deformity and no laceration.       Thoracic back: Normal.       Lumbar back: Normal.       Left upper leg: Normal.  Pain in the low back, radiates into the LEFT hip and groin with full hip flexion, ABduction, internal and external rotation.  Lymphadenopathy:       Head (right side): No tonsillar, no preauricular, no posterior auricular and no occipital adenopathy present.       Head (left side): No tonsillar, no preauricular, no posterior auricular and no occipital  adenopathy present.    She has no cervical adenopathy.       Right: No supraclavicular adenopathy present.       Left: No supraclavicular adenopathy present.  Neurological: She is alert and oriented to person, place, and time. No sensory deficit.  Skin: Skin is warm, dry and intact. No rash noted. No cyanosis or erythema. Nails show no clubbing.  Psychiatric: She has a normal mood and affect. Her speech is normal and behavior is normal.      Assessment & Plan:   1. Acute left-sided low back pain without sciatica Known DDD if the c-spine. Likely present in the lumbar region as well. Suspect the origin is the low back, rather than the hip joint itself. Await radiographs, and plan treatment with those results. Consider: additional imaging, referral to speciality care, referral to PT, oral prednisone taper. - DG HIP UNILAT W OR W/O PELVIS 2-3 VIEWS LEFT; Future -  DG Lumbar Spine Complete; Future    Return if symptoms worsen or fail to improve, pending radiograph results.Fernande Bras, PA-C [facility]

## 2017-11-09 NOTE — Patient Instructions (Addendum)
1. When you see your dentist, please tell them about the TMJ. They may have treatment to offer. 2. When the cardiology office calls, please schedule with them! [phone] 3. If the dizziness and ringing are not improved with the treatment offered by your dentist and the cardiologist says that the murmur is unrelated to your dizziness, we'll plan to refer you to seen an ENT specialist.    IF you received an x-ray today, you will receive an invoice from [facility]. Please contact [facility] at [phone] with questions or concerns regarding your invoice.   IF you received labwork today, you will receive an invoice from [facility]. Please contact [facility] at [phone] with questions or concerns regarding your invoice.   Our billing staff will not be able to assist you with questions regarding bills from these companies.  You will be contacted with the lab results as soon as they are available. The fastest way to get your results is to activate your My Chart account. Instructions are located on the last page of this paperwork. If you have not heard from [place] regarding the results in 2 weeks, please contact this office.

## 2017-11-10 ENCOUNTER — Other Ambulatory Visit: Payer: Self-pay | Admitting: Physician Assistant

## 2017-11-10 DIAGNOSIS — M25552 Pain in left hip: Secondary | ICD-10-CM

## 2017-11-10 DIAGNOSIS — M545 Low back pain, unspecified: Secondary | ICD-10-CM

## 2017-11-10 MED ORDER — PREDNISONE 20 MG PO TABS: ORAL_TABLET | ORAL | 0 refills | Status: DC

## 2017-11-17 ENCOUNTER — Other Ambulatory Visit: Payer: Self-pay | Admitting: Physician Assistant

## 2017-11-17 DIAGNOSIS — M545 Low back pain, unspecified: Secondary | ICD-10-CM

## 2017-11-17 DIAGNOSIS — M25552 Pain in left hip: Secondary | ICD-10-CM

## 2017-11-29 NOTE — Progress Notes (Deleted)
Cardiology Office Note   Date:  11/29/2017   ID:  Brandi Harrington, DOB 1946-06-13, MRN [id number]  PCP:  Harrison Mons, PA-C  Cardiologist:   Jenkins Rouge, MD   No chief complaint on file.     History of Present Illness: Brandi Harrington is a 72 y.o. female who presents for consultation regarding murmur Referred by Harrison Mons. Smokes after work Reviewed his office note 11/09/17  Complains of left hip pain Mid thoracic back pain ear and jaw pain has had dizziness since 2017 BP not postural. CT NAD MRI no neuroma carotids plaque less than 40% Failed meclizine She is thought to have an aortic stenosis murmur Describes dizziness as room spinning No longer driving   ***    Past Medical History:  Diagnosis Date  . Hyperlipidemia   . Hypertension   . Left hip pain 11/09/2017  . Osteopenia   . TMJ syndrome   . Undiagnosed cardiac murmurs   . Vertigo 09/14/2016  . Vitamin D deficiency     Past Surgical History:  Procedure Laterality Date  . COLONOSCOPY    . TUBAL LIGATION       Current Outpatient Medications  Medication Sig Dispense Refill  . alendronate (FOSAMAX) 70 MG tablet TAKE ONE TABLET BY MOUTH ONCE A WEEK WITH  A  FULL  GLASS  OF  WATER  ON  AN  EMPTY  STOMACH 12 tablet 3  . amLODipine (NORVASC) 10 MG tablet TAKE ONE TABLET BY MOUTH ONCE DAILY 90 tablet 3  . Ascorbic Acid (VITAMIN C) 1000 MG tablet Take 1,000 mg by mouth daily.    Marland Kitchen aspirin EC 81 MG tablet Take 81 mg by mouth daily.    Marland Kitchen atorvastatin (LIPITOR) 40 MG tablet Take 1 tablet (40 mg total) by mouth daily. 90 tablet 3  . cholecalciferol (VITAMIN D) 1000 units tablet Take 1,000 Units by mouth every evening.    . fish oil-omega-3 fatty acids 1000 MG capsule Take 1,200 mg by mouth daily.     Marland Kitchen ketoconazole (NIZORAL) 2 % cream Apply 1 application topically 2 (two) times daily. 60 g 0  . lovastatin (MEVACOR) 40 MG tablet Take 40 mg by mouth at bedtime.    . meclizine (ANTIVERT) 25 MG tablet Take 1 tablet (25 mg  total) by mouth 3 (three) times daily as needed for dizziness. 30 tablet 0  . meloxicam (MOBIC) 15 MG tablet Take 1 tablet (15 mg total) by mouth daily. 30 tablet 5  . Multiple Vitamins-Minerals (MULTIVITAMIN ADULTS) TABS Take 1 tablet by mouth daily.    . predniSONE (DELTASONE) 20 MG tablet Take 3 PO QAM x3days, 2 PO QAM x3days, 1 PO QAM x3days 18 tablet 0   No current facility-administered medications for this visit.     Allergies:   Patient has no known allergies.    Social History:  The patient  reports that she has been smoking cigarettes.  She has been smoking about 0.30 packs per day. she has never used smokeless tobacco. She reports that she drinks about 3.0 oz of alcohol per week. She reports that she does not use drugs.   Family History:  The patient's family history includes Alcohol abuse in her daughter; Arthritis in her sister; Breast cancer in her daughter; Diabetes in her sister; Heart disease in her son; Scoliosis in her sister; Transient ischemic attack in her mother.    ROS:  Please see the history of present illness.   Otherwise, review  of systems are positive for none.   All other systems are reviewed and negative.    PHYSICAL EXAM: VS:  There were no vitals taken for this visit. , BMI There is no height or weight on file to calculate BMI. Affect appropriate Healthy:  appears stated age 36: normal Neck supple with no adenopathy JVP normal no bruits no thyromegaly Lungs clear with no wheezing and good diaphragmatic motion Heart:  S1/S2 no murmur, no rub, gallop or click PMI normal Abdomen: benighn, BS positve, no tenderness, no AAA no bruit.  No HSM or HJR Distal pulses intact with no bruits No edema Neuro non-focal Skin warm and dry No muscular weakness    EKG:  2016 SR rate 51 ICRBBB LAE    Recent Labs: 02/23/2017: TSH 1.090 08/17/2017: ALT 33; BUN 16; Creatinine, Ser 0.98; Hemoglobin 12.8; Platelets 345; Potassium 4.2; Sodium 144    Lipid Panel     Component Value Date/Time   CHOL 240 (H) 08/17/2017 0937   TRIG 70 08/17/2017 0937   HDL 87 08/17/2017 0937   CHOLHDL 2.8 08/17/2017 0937   CHOLHDL 2.5 02/26/2015 1110   VLDL 22 02/26/2015 1110   LDLCALC 139 (H) 08/17/2017 0937      Wt Readings from Last 3 Encounters:  11/09/17 155 lb (70.3 kg)  10/19/17 150 lb 6.4 oz (68.2 kg)  10/18/17 154 lb (69.9 kg)      Other studies Reviewed: Additional studies/ records that were reviewed today include: Notes from primary , neurology labs, ECG CT, MRI , carotid duplex .    ASSESSMENT AND PLAN:  1.  Murmur 2. Dizziness 3. HTN 4. HLD 5.    Current medicines are reviewed at length with the patient today.  The patient does not have concerns regarding medicines.  The following changes have been made:  no change  Labs/ tests ordered today include: Echo TTE  No orders of the defined types were placed in this encounter.    Disposition:   FU with cardiology PRN      Signed, Jenkins Rouge, MD  11/29/2017 5:38 PM    [address], [place], [place]  [id number] Phone: [phone]; Fax: [phone]

## 2017-11-30 ENCOUNTER — Ambulatory Visit: Payer: Medicare HMO | Admitting: Cardiovascular Disease

## 2017-12-27 ENCOUNTER — Encounter: Payer: Self-pay | Admitting: Physician Assistant

## 2018-01-17 NOTE — Progress Notes (Deleted)
Cardiology Office Note   Date:  01/17/2018   ID:  Envy, Meno 04-02-1946, MRN [id number]  PCP:  Harrison Mons, PA-C  Cardiologist:   Jenkins Rouge, MD   No chief complaint on file.     History of Present Illness: YALITZA TEED is a 72 y.o. female who presents for consultation regarding murmur. Referred by Daphane Shepherd PA-C CRF;s include HTN and HLD  She is also an everyday smoker. His office Note indicated undiagnosed mrumru 10/19/17.  She has had some TMJ and vertigo with dizziness. Carotids done 2017 noted <40% bilateral ICA disease  ***    Past Medical History:  Diagnosis Date  . Hyperlipidemia   . Hypertension   . Left hip pain 11/09/2017  . Osteopenia   . TMJ syndrome   . Undiagnosed cardiac murmurs   . Vertigo 09/14/2016  . Vitamin D deficiency     Past Surgical History:  Procedure Laterality Date  . COLONOSCOPY    . TUBAL LIGATION       Current Outpatient Medications  Medication Sig Dispense Refill  . alendronate (FOSAMAX) 70 MG tablet TAKE ONE TABLET BY MOUTH ONCE A WEEK WITH  A  FULL  GLASS  OF  WATER  ON  AN  EMPTY  STOMACH 12 tablet 3  . amLODipine (NORVASC) 10 MG tablet TAKE ONE TABLET BY MOUTH ONCE DAILY 90 tablet 3  . Ascorbic Acid (VITAMIN C) 1000 MG tablet Take 1,000 mg by mouth daily.    Marland Kitchen aspirin EC 81 MG tablet Take 81 mg by mouth daily.    Marland Kitchen atorvastatin (LIPITOR) 40 MG tablet Take 1 tablet (40 mg total) by mouth daily. 90 tablet 3  . cholecalciferol (VITAMIN D) 1000 units tablet Take 1,000 Units by mouth every evening.    . fish oil-omega-3 fatty acids 1000 MG capsule Take 1,200 mg by mouth daily.     Marland Kitchen ketoconazole (NIZORAL) 2 % cream Apply 1 application topically 2 (two) times daily. 60 g 0  . lovastatin (MEVACOR) 40 MG tablet Take 40 mg by mouth at bedtime.    . meclizine (ANTIVERT) 25 MG tablet Take 1 tablet (25 mg total) by mouth 3 (three) times daily as needed for dizziness. 30 tablet 0  . meloxicam (MOBIC) 15 MG tablet Take 1  tablet (15 mg total) by mouth daily. 30 tablet 5  . Multiple Vitamins-Minerals (MULTIVITAMIN ADULTS) TABS Take 1 tablet by mouth daily.    . predniSONE (DELTASONE) 20 MG tablet Take 3 PO QAM x3days, 2 PO QAM x3days, 1 PO QAM x3days 18 tablet 0   No current facility-administered medications for this visit.     Allergies:   Patient has no known allergies.    Social History:  The patient  reports that she has been smoking cigarettes.  She has been smoking about 0.30 packs per day. She has never used smokeless tobacco. She reports that she drinks about 3.0 oz of alcohol per week. She reports that she does not use drugs.   Family History:  The patient's family history includes Alcohol abuse in her daughter; Arthritis in her sister; Breast cancer in her daughter; Diabetes in her sister; Heart disease in her son; Scoliosis in her sister; Transient ischemic attack in her mother.    ROS:  Please see the history of present illness.   Otherwise, review of systems are positive for none.   All other systems are reviewed and negative.    PHYSICAL EXAM: VS:  There were no vitals taken for this visit. , BMI There is no height or weight on file to calculate BMI. Affect appropriate Healthy:  appears stated age 58: normal Neck supple with no adenopathy JVP normal no bruits no thyromegaly Lungs clear with no wheezing and good diaphragmatic motion Heart:  S1/S2 2/6 SEM  murmur, no rub, gallop or click PMI normal Abdomen: benighn, BS positve, no tenderness, no AAA no bruit.  No HSM or HJR Distal pulses intact with no bruits No edema Neuro non-focal Skin warm and dry No muscular weakness    EKG:  2016 SR LAE poor R wave progression    Recent Labs: 02/23/2017: TSH 1.090 08/17/2017: ALT 33; BUN 16; Creatinine, Ser 0.98; Hemoglobin 12.8; Platelets 345; Potassium 4.2; Sodium 144    Lipid Panel    Component Value Date/Time   CHOL 240 (H) 08/17/2017 0937   TRIG 70 08/17/2017 0937   HDL 87  08/17/2017 0937   CHOLHDL 2.8 08/17/2017 0937   CHOLHDL 2.5 02/26/2015 1110   VLDL 22 02/26/2015 1110   LDLCALC 139 (H) 08/17/2017 0937      Wt Readings from Last 3 Encounters:  11/09/17 155 lb (70.3 kg)  10/19/17 150 lb 6.4 oz (68.2 kg)  10/18/17 154 lb (69.9 kg)      Other studies Reviewed: Additional studies/ records that were reviewed today include: Primary care notes, labs ECG Carotid duplex 2017.    ASSESSMENT AND PLAN:  1.  Murmur: *** 2. HTN:  Well controlled.  Continue current medications and low sodium Dash type diet.   3. HLD:   Cholesterol is at goal.  Continue current dose of statin and diet Rx.  No myalgias or side effects.  F/U  LFT's in 6 months. Lab Results  Component Value Date   LDLCALC 139 (H) 08/17/2017            4. Vertigo/Dizziness:  Non cardiac keep f/u appt with neurology    Current medicines are reviewed at length with the patient today.  The patient ess  concerns regarding medicines.  The following changes have been made:  no change  Labs/ tests ordered today include: TTE  No orders of the defined types were placed in this encounter.    Disposition:   FU with cardiology PRN      Signed, Jenkins Rouge, MD  01/17/2018 5:46 PM    [facility] [place], [place], [place]  [address] Phone: [phone]; Fax: [phone]

## 2018-01-19 ENCOUNTER — Telehealth: Payer: Self-pay | Admitting: Physician Assistant

## 2018-01-19 NOTE — Telephone Encounter (Signed)
Copied from [place]. Topic: Quick Communication - See Telephone Encounter >> Jan 19, 2018  2:21 PM Ivar Drape wrote: CRM for notification. See Telephone encounter for: 01/19/18. Patient needs a note to get out of jury duty.  Patient said she needs the note today.

## 2018-01-19 NOTE — Telephone Encounter (Signed)
Phone call to patient. 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 Patient advised note cannot be provided from our office for this reason. Patient verbalizes understanding.

## 2018-01-20 ENCOUNTER — Ambulatory Visit: Payer: Medicare HMO | Admitting: Cardiovascular Disease

## 2018-02-15 ENCOUNTER — Ambulatory Visit: Payer: Medicare HMO | Admitting: Physician Assistant

## 2018-02-15 ENCOUNTER — Other Ambulatory Visit: Payer: Self-pay

## 2018-02-15 ENCOUNTER — Encounter: Payer: Self-pay | Admitting: Physician Assistant

## 2018-02-15 ENCOUNTER — Ambulatory Visit (INDEPENDENT_AMBULATORY_CARE_PROVIDER_SITE_OTHER): Payer: Medicare HMO | Admitting: Physician Assistant

## 2018-02-15 VITALS — BP 120/78 | HR 88 | Temp 98.4°F | Resp 16 | Ht 61.61 in | Wt 155.8 lb

## 2018-02-15 DIAGNOSIS — R011 Cardiac murmur, unspecified: Secondary | ICD-10-CM | POA: Diagnosis not present

## 2018-02-15 DIAGNOSIS — M5030 Other cervical disc degeneration, unspecified cervical region: Secondary | ICD-10-CM | POA: Diagnosis not present

## 2018-02-15 DIAGNOSIS — H9311 Tinnitus, right ear: Secondary | ICD-10-CM

## 2018-02-15 DIAGNOSIS — R42 Dizziness and giddiness: Secondary | ICD-10-CM

## 2018-02-15 DIAGNOSIS — E785 Hyperlipidemia, unspecified: Secondary | ICD-10-CM

## 2018-02-15 DIAGNOSIS — L309 Dermatitis, unspecified: Secondary | ICD-10-CM | POA: Diagnosis not present

## 2018-02-15 DIAGNOSIS — I10 Essential (primary) hypertension: Secondary | ICD-10-CM

## 2018-02-15 MED ORDER — LOVASTATIN 40 MG PO TABS: 40.0000 mg | ORAL_TABLET | Freq: Every day | ORAL | 3 refills | Status: DC

## 2018-02-15 MED ORDER — MECLIZINE HCL 25 MG PO TABS: 25.0000 mg | ORAL_TABLET | Freq: Three times a day (TID) | ORAL | 5 refills | Status: DC | PRN

## 2018-02-15 MED ORDER — MELOXICAM 15 MG PO TABS: 15.0000 mg | ORAL_TABLET | Freq: Every day | ORAL | 1 refills | Status: DC

## 2018-02-15 MED ORDER — AMLODIPINE BESYLATE 10 MG PO TABS: 10.0000 mg | ORAL_TABLET | Freq: Every day | ORAL | 3 refills | Status: DC

## 2018-02-15 MED ORDER — KETOCONAZOLE 2 % EX CREA: 1.0000 "application " | TOPICAL_CREAM | Freq: Two times a day (BID) | CUTANEOUS | 5 refills | Status: DC

## 2018-02-15 NOTE — Assessment & Plan Note (Signed)
Await labs. Adjust regimen as indicated by results.  

## 2018-02-15 NOTE — Assessment & Plan Note (Signed)
Ready to proceed with ENT evaluation.

## 2018-02-15 NOTE — Assessment & Plan Note (Signed)
Controlled. Stable. Continue current treatment.

## 2018-02-15 NOTE — Progress Notes (Signed)
Patient ID: Brandi Harrington, female    DOB: 1946/03/14, 72 y.o.   MRN: [id number]  PCP: Porfirio Oar, PA-C  Chief Complaint  Patient presents with  . Hypertension    follow up   . Hyperlipidemia    follow up   . Hip Pain    follow up     Subjective:   Presents for evaluation of HTN, hyperlipidemia and RIGHT hip pain.  Tolerating her medications without difficulty.  RIGHT hip is improved. Chiropractor adjustments. She tweaked it a little recently helping her son-in-law, and has had some increased soreness in the RIGHT thigh. Pain with sit-to-stand.  Scheduled to see cardiology on 6/05 for evaluation of new cardiac murmur.  Vertigo continues intermittently. Also tinnitus. Ready to proceed with additional evaluation.  Has had one episode of mid chest pain with eating. Pain was brief, and has not recurred.  Notes that she has a couple of teeth that need extraction. Wants to wait until the vertigo is resolved before scheduling with her dentist.  Review of Systems As above. No CP, SOB, HA, nausea, vomiting, diarrhea. No rash. No fever, chills.    Patient Active Problem List   Diagnosis Date Noted  . Undiagnosed cardiac murmurs 03/03/2017  . DDD (degenerative disc disease), cervical 11/17/2016  . Unilateral hearing loss, right 10/20/2016  . Vertigo 10/20/2016  . Tinnitus, right ear 09/12/2016  . BMI 29.0-29.9,adult 06/25/2016  . Facial paresthesia 02/26/2015  . Lipoma of back 02/26/2015  . Tobacco use 01/18/2015  . Hepatitis C antibody test positive 02/15/2013  . HTN (hypertension) 05/12/2012  . Hyperlipidemia 05/12/2012  . Osteopenia   . TMJ syndrome   . Vitamin D deficiency      Prior to Admission medications   Medication Sig Start Date End Date Taking? Authorizing Provider  alendronate (FOSAMAX) 70 MG tablet TAKE ONE TABLET BY MOUTH ONCE A WEEK WITH  A  FULL  GLASS  OF  WATER  ON  AN  EMPTY  STOMACH 06/02/17  Yes Royden Bulman, PA-C  amLODipine (NORVASC)  10 MG tablet TAKE ONE TABLET BY MOUTH ONCE DAILY 03/09/17  Yes Latisha Lasch, PA-C  Ascorbic Acid (VITAMIN C) 1000 MG tablet Take 1,000 mg by mouth daily.   Yes [provider]  aspirin EC 81 MG tablet Take 81 mg by mouth daily.   Yes [provider]  cholecalciferol (VITAMIN D) 1000 units tablet Take 1,000 Units by mouth every evening.   Yes [provider]  fish oil-omega-3 fatty acids 1000 MG capsule Take 1,200 mg by mouth daily.    Yes [provider]  lovastatin (MEVACOR) 40 MG tablet Take 40 mg by mouth at bedtime.   Yes [provider]  meclizine (ANTIVERT) 25 MG tablet Take 1 tablet (25 mg total) by mouth 3 (three) times daily as needed for dizziness. 09/29/16  Yes Magin Balbi, PA-C  meloxicam (MOBIC) 15 MG tablet Take 1 tablet (15 mg total) by mouth daily. 08/17/17  Yes Carriann Hesse, PA-C  Multiple Vitamins-Minerals (MULTIVITAMIN ADULTS) TABS Take 1 tablet by mouth daily.   Yes [provider]     No Known Allergies     Objective:  Physical Exam  Constitutional: She is oriented to person, place, and time. She appears well-developed and well-nourished. She is active and cooperative. No distress.  BP 120/78   Pulse 88   Temp 98.4 F (36.9 C)   Resp 16   Ht 5' 1.61" (1.565 m)  Wt 155 lb 12.8 oz (70.7 kg)   SpO2 94%   BMI 28.85 kg/m   HENT:  Head: Normocephalic and atraumatic.  Right Ear: Hearing normal.  Left Ear: Hearing normal.  Eyes: Conjunctivae are normal. No scleral icterus.  Neck: Normal range of motion. Neck supple. No thyromegaly present.  Cardiovascular: Normal rate, regular rhythm and normal heart sounds.  Pulses:      Radial pulses are 2+ on the right side, and 2+ on the left side.  Pulmonary/Chest: Effort normal and breath sounds normal.  Lymphadenopathy:       Head (right side): No tonsillar, no preauricular, no posterior auricular and no occipital adenopathy present.       Head (left side): No  tonsillar, no preauricular, no posterior auricular and no occipital adenopathy present.    She has no cervical adenopathy.       Right: No supraclavicular adenopathy present.       Left: No supraclavicular adenopathy present.  Neurological: She is alert and oriented to person, place, and time. No sensory deficit.  Skin: Skin is warm, dry and intact. No rash noted. No cyanosis or erythema. Nails show no clubbing.  Psychiatric: She has a normal mood and affect. Her speech is normal and behavior is normal.    Wt Readings from Last 3 Encounters:  02/15/18 155 lb 12.8 oz (70.7 kg)  11/09/17 155 lb (70.3 kg)  10/19/17 150 lb 6.4 oz (68.2 kg)      Assessment & Plan:   Problem List Items Addressed This Visit    Vertigo    Persists. Ready for ENT re-evaluation.      Relevant Medications   meclizine (ANTIVERT) 25 MG tablet   Other Relevant Orders   Ambulatory referral to ENT   Undiagnosed cardiac murmurs    Almost inaudible today. Proceed with evaluation with cardiology as planned on 02/23/2018.      Tinnitus, right ear    Ready to proceed with ENT evaluation.      Relevant Orders   Ambulatory referral to ENT   Hyperlipidemia - Primary (Chronic)    Await labs. Adjust regimen as indicated by results.      Relevant Medications   amLODipine (NORVASC) 10 MG tablet   lovastatin (MEVACOR) 40 MG tablet   Other Relevant Orders   Comprehensive metabolic panel   Lipid panel   HTN (hypertension) (Chronic)    Controlled. Stable. Continue current treatment.      Relevant Medications   amLODipine (NORVASC) 10 MG tablet   lovastatin (MEVACOR) 40 MG tablet   Other Relevant Orders   CBC with Differential/Platelet   Comprehensive metabolic panel   TSH   Urinalysis, dipstick only   DDD (degenerative disc disease), cervical   Relevant Medications   meloxicam (MOBIC) 15 MG tablet    Other Visit Diagnoses    Dermatitis       Relevant Medications   ketoconazole (NIZORAL) 2 % cream         Return in about 4 months (around 06/18/2018) for re-evaluation of cholesterol, blood pressure, vertigo, hip pain.   Fernande Bras, PA-C Primary [facility] at [facility]

## 2018-02-15 NOTE — Patient Instructions (Addendum)
Please schedule with a dentist, to take care of the teeth.  Go ahead and call [facility] to schedule your next visit with me there. [phone].   IF you received an x-ray today, you will receive an invoice from [facility]. Please contact [facility] at [phone] with questions or concerns regarding your invoice.   IF you received labwork today, you will receive an invoice from [facility]. Please contact [facility] at [phone] with questions or concerns regarding your invoice.   Our billing staff will not be able to assist you with questions regarding bills from these companies.  You will be contacted with the lab results as soon as they are available. The fastest way to get your results is to activate your My Chart account. Instructions are located on the last page of this paperwork. If you have not heard from [place] regarding the results in 2 weeks, please contact this office.

## 2018-02-15 NOTE — Assessment & Plan Note (Signed)
Almost inaudible today. Proceed with evaluation with cardiology as planned on 02/23/2018.

## 2018-02-15 NOTE — Assessment & Plan Note (Signed)
Persists. Ready for ENT re-evaluation.

## 2018-02-16 LAB — CBC WITH DIFFERENTIAL/PLATELET
Basophils Absolute: 0 10*3/uL (ref 0.0–0.2)
Basos: 1 %
EOS (ABSOLUTE): 0.6 10*3/uL — ABNORMAL HIGH (ref 0.0–0.4)
Eos: 10 %
Hematocrit: 36.1 % (ref 34.0–46.6)
Hemoglobin: 12.1 g/dL (ref 11.1–15.9)
Immature Grans (Abs): 0 10*3/uL (ref 0.0–0.1)
Immature Granulocytes: 0 %
Lymphocytes Absolute: 2.5 10*3/uL (ref 0.7–3.1)
Lymphs: 40 %
MCH: 31.5 pg (ref 26.6–33.0)
MCHC: 33.5 g/dL (ref 31.5–35.7)
MCV: 94 fL (ref 79–97)
Monocytes Absolute: 0.4 10*3/uL (ref 0.1–0.9)
Monocytes: 6 %
Neutrophils Absolute: 2.7 10*3/uL (ref 1.4–7.0)
Neutrophils: 43 %
Platelets: 393 10*3/uL (ref 150–450)
RBC: 3.84 x10E6/uL (ref 3.77–5.28)
RDW: 14.3 % (ref 12.3–15.4)
WBC: 6.2 10*3/uL (ref 3.4–10.8)

## 2018-02-16 LAB — COMPREHENSIVE METABOLIC PANEL
ALK PHOS: 86 IU/L (ref 39–117)
ALT: 17 IU/L (ref 0–32)
AST: 28 IU/L (ref 0–40)
Albumin/Globulin Ratio: 1.7 (ref 1.2–2.2)
Albumin: 4.5 g/dL (ref 3.5–4.8)
BUN/Creatinine Ratio: 13 (ref 12–28)
BUN: 12 mg/dL (ref 8–27)
CHLORIDE: 104 mmol/L (ref 96–106)
CO2: 19 mmol/L — ABNORMAL LOW (ref 20–29)
Calcium: 9.3 mg/dL (ref 8.7–10.3)
Creatinine, Ser: 0.9 mg/dL (ref 0.57–1.00)
GFR calc non Af Amer: 65 mL/min/{1.73_m2} (ref >59)
GFR, EST AFRICAN AMERICAN: 75 mL/min/{1.73_m2} (ref >59)
GLUCOSE: 74 mg/dL (ref 65–99)
Globulin, Total: 2.6 g/dL (ref 1.5–4.5)
Potassium: 4.5 mmol/L (ref 3.5–5.2)
Sodium: 141 mmol/L (ref 134–144)
TOTAL PROTEIN: 7.1 g/dL (ref 6.0–8.5)

## 2018-02-16 LAB — LIPID PANEL
CHOLESTEROL TOTAL: 201 mg/dL — AB (ref 100–199)
Chol/HDL Ratio: 2.3 ratio (ref 0.0–4.4)
HDL: 88 mg/dL (ref >39)
LDL Calculated: 99 mg/dL (ref 0–99)
Triglycerides: 68 mg/dL (ref 0–149)
VLDL CHOLESTEROL CAL: 14 mg/dL (ref 5–40)

## 2018-02-16 LAB — URINALYSIS, DIPSTICK ONLY
Bilirubin, UA: NEGATIVE
Glucose, UA: NEGATIVE
Ketones, UA: NEGATIVE
LEUKOCYTES UA: NEGATIVE
NITRITE UA: NEGATIVE
Protein, UA: NEGATIVE
RBC, UA: NEGATIVE
Specific Gravity, UA: 1.013 (ref 1.005–1.030)
Urobilinogen, Ur: 0.2 mg/dL (ref 0.2–1.0)
pH, UA: 5.5 (ref 5.0–7.5)

## 2018-02-16 LAB — TSH: TSH: 1.29 u[IU]/mL (ref 0.450–4.500)

## 2018-02-21 ENCOUNTER — Encounter: Payer: Self-pay | Admitting: *Deleted

## 2018-02-22 NOTE — Progress Notes (Signed)
Cardiology Office Note    Date:  02/23/2018   ID:  Brandi Harrington, DOB [birth year], MRN [id number]  PCP:  Harrison Mons, PA-C  Cardiologist: New  Chief Complaint  Patient presents with  . New Patient (Initial Visit)    History of Present Illness:   Brandi Harrington is a 72 y.o. female who is being seen today for the evaluation of heart murmur at the request of Harrison Mons, PA-C.  Patient also has history of hypertension, HLD.  Patient comes in today for evaluation. No prior history of cardiac problems or murmur.  No history of rheumatic heart disease.  She says that her BP up today because she had a cup of coffee.  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 Denies chest pain, palpitations, shortness of breath. Has chronic vertigo.   Past Medical History:  Diagnosis Date  . Hyperlipidemia   . Hypertension   . Left hip pain 11/09/2017  . Osteopenia   . TMJ syndrome   . Undiagnosed cardiac murmurs   . Vertigo 09/14/2016  . Vitamin D deficiency     Past Surgical History:  Procedure Laterality Date  . COLONOSCOPY    . TUBAL LIGATION      Current Medications: Current Meds  Medication Sig  . alendronate (FOSAMAX) 70 MG tablet TAKE ONE TABLET BY MOUTH ONCE A WEEK WITH  A  FULL  GLASS  OF  WATER  ON  AN  EMPTY  STOMACH  . amLODipine (NORVASC) 10 MG tablet Take 1 tablet (10 mg total) by mouth daily.  . Ascorbic Acid (VITAMIN C) 1000 MG tablet Take 1,000 mg by mouth daily.  Marland Kitchen aspirin EC 81 MG tablet Take 81 mg by mouth daily.  . cholecalciferol (VITAMIN D) 1000 units tablet Take 1,000 Units by mouth every evening.  . fish oil-omega-3 fatty acids 1000 MG capsule Take 1,200 mg by mouth daily.   Marland Kitchen ketoconazole (NIZORAL) 2 % cream Apply 1 application topically 2 (two) times daily.  Marland Kitchen lovastatin (MEVACOR) 40 MG tablet Take 1 tablet (40 mg total) by mouth at bedtime.  . meclizine (ANTIVERT) 25 MG tablet  Take 1 tablet (25 mg total) by mouth 3 (three) times daily as needed for dizziness.  . meloxicam (MOBIC) 15 MG tablet Take 1 tablet (15 mg total) by mouth daily.  . Multiple Vitamins-Minerals (MULTIVITAMIN ADULTS) TABS Take 1 tablet by mouth daily.     Allergies:   Patient has no known allergies.   Social History   Socioeconomic History  . Marital status: Divorced    Spouse name: n/a  . Number of children: 2  . Years of education: 12th grade  . Highest education level: Not on file  Occupational History  . Occupation: Information systems manager: [employer]  . Financial resource strain: Not on file  . Food insecurity:    Worry: Not on file    Inability: Not on file  . Transportation needs:    Medical: Not on file    Non-medical: Not on file  Tobacco Use  . Smoking status: Current Some Day Smoker    Packs/day: 0.30    Types: Cigarettes  . Smokeless tobacco: Never Used  . Tobacco comment: may smoke 1 cigarette in the evening after work  Substance and Sexual Activity  . Alcohol use: Yes  Alcohol/week: 3.0 oz    Types: 5 Cans of beer per week  . Drug use: No  . Sexual activity: Yes    Partners: Male    Birth control/protection: Post-menopausal  Lifestyle  . Physical activity:    Days per week: Not on file    Minutes per session: Not on file  . Stress: Not on file  Relationships  . Social connections:    Talks on phone: Not on file    Gets together: Not on file    Attends religious service: Not on file    Active member of club or organization: Not on file    Attends meetings of clubs or organizations: Not on file    Relationship status: Not on file  Other Topics Concern  . Not on file  Social History Narrative   ** Merged History Encounter **       House on the [employer] bankruptcy (not her business)-couldn't get a loan modification.  Ex-husband s/p CVA x several, residual left hemiparesis, DM diagnosed at age 41,  ASCVD (severe), s/p CABG . He's ill-humored and "mean." He moved into    an assisted living facility fall 2014, and they divorced in 10/2014.   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           Family History:  The patient's family history includes Alcohol abuse in her daughter; Arthritis in her sister; Breast cancer in her daughter; Diabetes in her sister; Heart disease in her son; Scoliosis in her sister; Transient ischemic attack in her mother.   ROS:   Please see the history of present illness.    Review of Systems  Constitution: Negative.  HENT: Negative.   Eyes: Negative.   Cardiovascular: Negative.   Respiratory: Negative.   Hematologic/Lymphatic: Negative.   Musculoskeletal: Negative.  Negative for joint pain.  Gastrointestinal: Negative.   Genitourinary: Negative.   Neurological: Positive for vertigo.   All other systems reviewed and are negative.   PHYSICAL EXAM:   VS:  BP (!) 152/80   Pulse (!) 58   Ht 5' 1.61" (1.565 m)   Wt 156 lb (70.8 kg)   SpO2 99%   BMI 28.90 kg/m   Physical Exam  GEN: Well nourished, well developed, in no acute distress  Neck: no JVD, carotid bruits, or masses Cardiac:RRR; positive S4, very soft 1/6 systolic murmur at the left sternal border Respiratory:  clear to auscultation bilaterally, normal work of breathing GI: soft, nontender, nondistended, + BS Ext: without cyanosis, clubbing, or edema, Good distal pulses bilaterally Neuro:  Alert and Oriented x 3 Psych: euthymic mood, full affect  Wt Readings from Last 3 Encounters:  02/23/18 156 lb (70.8 kg)  02/15/18 155 lb 12.8 oz (70.7 kg)  11/09/17 155 lb (70.3 kg)      Studies/Labs Reviewed:   EKG:  EKG is  ordered today.  The ekg ordered today demonstrates normal sinus rhythm with incomplete right bundle branch block, nonspecific ST-T wave changes, no acute change, similar to  2016  Recent Labs: 02/15/2018: ALT 17; BUN 12; Creatinine, Ser 0.90; Hemoglobin 12.1; Platelets 393; Potassium 4.5; Sodium 141; TSH 1.290   Lipid Panel    Component Value Date/Time   CHOL 201 (H) 02/15/2018 1338   TRIG 68 02/15/2018 1338   HDL 88 02/15/2018 1338   CHOLHDL 2.3 02/15/2018 1338   CHOLHDL 2.5 02/26/2015 1110  VLDL 22 02/26/2015 1110   LDLCALC 99 02/15/2018 1338    Additional studies/ records that were reviewed today include:      ASSESSMENT:    1. Murmur, cardiac   2. Essential hypertension   3. Hyperlipidemia, unspecified hyperlipidemia type   4. Vertigo      PLAN:  In order of problems listed above:  Systolic murmur sounds very benign.  Will check 2D echo.  Follow-up pending results of echo. Discussed with Dr. Tamala Julian who concurs.  Essential hypertension mostly well controlled.  Patient did have caffeine this morning which she says is always makes it go up.  Managed by PCP  Hyperlipidemia on lovastatin LDL 99 on 02/15/2018 improved from 139 back in November managed by PCP  Vertigo on meclizine    Medication Adjustments/Labs and Tests Ordered: Current medicines are reviewed at length with the patient today.  Concerns regarding medicines are outlined above.  Medication changes, Labs and Tests ordered today are listed in the Patient Instructions below. Patient Instructions  Medication Instructions:  Your physician recommends that you continue on your current medications as directed. Please refer to the Current Medication list given to you today.   Labwork: none  Testing/Procedures: Your physician has requested that you have an echocardiogram. Echocardiography is a painless test that uses sound waves to create images of your heart. It provides your doctor with information about the size and shape of your heart and how well your heart's chambers and valves are working. This procedure takes approximately one hour. There are no restrictions for this  procedure.    Follow-Up: Follow up will be determined based on results of echocardiogram  Any Other Special Instructions Will Be Listed Below (If Applicable).     If you need a refill on your cardiac medications before your next appointment, please call your pharmacy.      Sumner Boast, PA-C  02/23/2018 9:56 AM    [facility] [place], [place], [place]  [address] Phone: [phone]; Fax: [phone]

## 2018-02-23 ENCOUNTER — Encounter (INDEPENDENT_AMBULATORY_CARE_PROVIDER_SITE_OTHER): Payer: Self-pay

## 2018-02-23 ENCOUNTER — Encounter: Payer: Self-pay | Admitting: Physician Assistant

## 2018-02-23 ENCOUNTER — Ambulatory Visit (INDEPENDENT_AMBULATORY_CARE_PROVIDER_SITE_OTHER): Payer: Medicare HMO | Admitting: Physician Assistant

## 2018-02-23 VITALS — BP 152/80 | HR 58 | Ht 61.61 in | Wt 156.0 lb

## 2018-02-23 DIAGNOSIS — E785 Hyperlipidemia, unspecified: Secondary | ICD-10-CM | POA: Diagnosis not present

## 2018-02-23 DIAGNOSIS — R011 Cardiac murmur, unspecified: Secondary | ICD-10-CM

## 2018-02-23 DIAGNOSIS — R42 Dizziness and giddiness: Secondary | ICD-10-CM | POA: Diagnosis not present

## 2018-02-23 DIAGNOSIS — I10 Essential (primary) hypertension: Secondary | ICD-10-CM | POA: Diagnosis not present

## 2018-02-23 NOTE — Patient Instructions (Addendum)
Medication Instructions:  Your physician recommends that you continue on your current medications as directed. Please refer to the Current Medication list given to you today.   Labwork: none  Testing/Procedures: Your physician has requested that you have an echocardiogram. Echocardiography is a painless test that uses sound waves to create images of your heart. It provides your doctor with information about the size and shape of your heart and how well your heart's chambers and valves are working. This procedure takes approximately one hour. There are no restrictions for this procedure.    Follow-Up: Follow up will be determined based on results of echocardiogram  Any Other Special Instructions Will Be Listed Below (If Applicable).   The physical therapist Ermalinda Barrios, PA told you about is Rudell Cobb.  She is at [facility].  Phone number is [phone]  If you need a refill on your cardiac medications before your next appointment, please call your pharmacy.

## 2018-02-24 NOTE — Addendum Note (Signed)
Addended by: Rose Phi on: 02/24/2018 05:10 PM   Modules accepted: Orders

## 2018-03-02 ENCOUNTER — Ambulatory Visit (HOSPITAL_COMMUNITY): Payer: Medicare HMO | Attending: Cardiology

## 2018-03-02 ENCOUNTER — Other Ambulatory Visit: Payer: Self-pay

## 2018-03-02 DIAGNOSIS — E785 Hyperlipidemia, unspecified: Secondary | ICD-10-CM | POA: Diagnosis not present

## 2018-03-02 DIAGNOSIS — I10 Essential (primary) hypertension: Secondary | ICD-10-CM | POA: Diagnosis not present

## 2018-03-02 DIAGNOSIS — I081 Rheumatic disorders of both mitral and tricuspid valves: Secondary | ICD-10-CM | POA: Diagnosis not present

## 2018-03-02 DIAGNOSIS — R011 Cardiac murmur, unspecified: Secondary | ICD-10-CM

## 2018-03-04 ENCOUNTER — Telehealth: Payer: Self-pay

## 2018-03-04 NOTE — Telephone Encounter (Signed)
Called pt. Back after agent lost connection. Pt. States she has a tooth causing some left sided facial swelling. Pt. Reports she will call her dentist and see if they can see her today. Will call back if needed.

## 2018-03-08 DIAGNOSIS — R42 Dizziness and giddiness: Secondary | ICD-10-CM | POA: Diagnosis not present

## 2018-04-13 DIAGNOSIS — Z720 Tobacco use: Secondary | ICD-10-CM | POA: Diagnosis not present

## 2018-04-13 DIAGNOSIS — I10 Essential (primary) hypertension: Secondary | ICD-10-CM | POA: Diagnosis not present

## 2018-04-13 DIAGNOSIS — R011 Cardiac murmur, unspecified: Secondary | ICD-10-CM | POA: Diagnosis not present

## 2018-08-11 DIAGNOSIS — L509 Urticaria, unspecified: Secondary | ICD-10-CM | POA: Diagnosis not present

## 2018-08-11 DIAGNOSIS — R220 Localized swelling, mass and lump, head: Secondary | ICD-10-CM | POA: Diagnosis not present

## 2018-08-11 DIAGNOSIS — M2550 Pain in unspecified joint: Secondary | ICD-10-CM | POA: Diagnosis not present

## 2018-10-21 DIAGNOSIS — E559 Vitamin D deficiency, unspecified: Secondary | ICD-10-CM | POA: Diagnosis not present

## 2018-10-21 DIAGNOSIS — I10 Essential (primary) hypertension: Secondary | ICD-10-CM | POA: Diagnosis not present

## 2018-10-21 DIAGNOSIS — R700 Elevated erythrocyte sedimentation rate: Secondary | ICD-10-CM | POA: Diagnosis not present

## 2018-10-21 DIAGNOSIS — M8580 Other specified disorders of bone density and structure, unspecified site: Secondary | ICD-10-CM | POA: Diagnosis not present

## 2018-10-21 DIAGNOSIS — Z131 Encounter for screening for diabetes mellitus: Secondary | ICD-10-CM | POA: Diagnosis not present

## 2018-10-21 DIAGNOSIS — M2550 Pain in unspecified joint: Secondary | ICD-10-CM | POA: Diagnosis not present

## 2018-10-21 DIAGNOSIS — Z0000 Encounter for general adult medical examination without abnormal findings: Secondary | ICD-10-CM | POA: Diagnosis not present

## 2018-10-21 DIAGNOSIS — N289 Disorder of kidney and ureter, unspecified: Secondary | ICD-10-CM | POA: Diagnosis not present

## 2018-10-21 DIAGNOSIS — Z1231 Encounter for screening mammogram for malignant neoplasm of breast: Secondary | ICD-10-CM | POA: Diagnosis not present

## 2018-10-21 DIAGNOSIS — E2839 Other primary ovarian failure: Secondary | ICD-10-CM | POA: Diagnosis not present

## 2018-10-21 DIAGNOSIS — E785 Hyperlipidemia, unspecified: Secondary | ICD-10-CM | POA: Diagnosis not present

## 2018-10-21 DIAGNOSIS — Z6829 Body mass index (BMI) 29.0-29.9, adult: Secondary | ICD-10-CM | POA: Diagnosis not present

## 2018-10-21 DIAGNOSIS — Z720 Tobacco use: Secondary | ICD-10-CM | POA: Diagnosis not present

## 2018-10-26 ENCOUNTER — Telehealth: Payer: Self-pay | Admitting: *Deleted

## 2018-10-26 ENCOUNTER — Ambulatory Visit: Payer: Medicare HMO | Admitting: Neurology

## 2018-10-26 NOTE — Telephone Encounter (Addendum)
The patient was checking in with the front desk at 12:50pm for her 1pm new patient appt.  Faith, RN went to bring the patient back while she was still at check-in.  The patient asked about how long she would be here today and Faith estimated 91minutes (for check-in, chart review, MMSE, time w/ MD, check-out).  The patient said she was too busy to stay for the appt and decided not to be seen.

## 2018-11-03 ENCOUNTER — Other Ambulatory Visit: Payer: Self-pay | Admitting: Physician Assistant

## 2018-11-09 ENCOUNTER — Other Ambulatory Visit: Payer: Self-pay | Admitting: Physician Assistant

## 2018-11-09 DIAGNOSIS — E2839 Other primary ovarian failure: Secondary | ICD-10-CM

## 2018-11-10 ENCOUNTER — Other Ambulatory Visit: Payer: Self-pay

## 2018-11-10 ENCOUNTER — Emergency Department (HOSPITAL_COMMUNITY)
Admission: EM | Admit: 2018-11-10 | Discharge: 2018-11-11 | Disposition: A | Discharge status: Home / Self Care | Payer: Medicare HMO | Attending: Emergency Medicine | Admitting: Emergency Medicine

## 2018-11-10 ENCOUNTER — Emergency Department (HOSPITAL_COMMUNITY): Payer: Medicare HMO

## 2018-11-10 DIAGNOSIS — W19XXXA Unspecified fall, initial encounter: Secondary | ICD-10-CM | POA: Diagnosis not present

## 2018-11-10 DIAGNOSIS — R112 Nausea with vomiting, unspecified: Secondary | ICD-10-CM | POA: Diagnosis not present

## 2018-11-10 DIAGNOSIS — R55 Syncope and collapse: Secondary | ICD-10-CM | POA: Diagnosis not present

## 2018-11-10 DIAGNOSIS — Z7982 Long term (current) use of aspirin: Secondary | ICD-10-CM | POA: Diagnosis not present

## 2018-11-10 DIAGNOSIS — R69 Illness, unspecified: Secondary | ICD-10-CM | POA: Diagnosis not present

## 2018-11-10 DIAGNOSIS — F17210 Nicotine dependence, cigarettes, uncomplicated: Secondary | ICD-10-CM | POA: Insufficient documentation

## 2018-11-10 DIAGNOSIS — I10 Essential (primary) hypertension: Secondary | ICD-10-CM | POA: Insufficient documentation

## 2018-11-10 DIAGNOSIS — R51 Headache: Secondary | ICD-10-CM | POA: Diagnosis not present

## 2018-11-10 DIAGNOSIS — Z79899 Other long term (current) drug therapy: Secondary | ICD-10-CM | POA: Insufficient documentation

## 2018-11-10 DIAGNOSIS — R9431 Abnormal electrocardiogram [ECG] [EKG]: Secondary | ICD-10-CM | POA: Diagnosis not present

## 2018-11-10 DIAGNOSIS — R1111 Vomiting without nausea: Secondary | ICD-10-CM | POA: Diagnosis not present

## 2018-11-10 LAB — CBC WITH DIFFERENTIAL/PLATELET
Abs Immature Granulocytes: 0.03 10*3/uL (ref 0.00–0.07)
Basophils Absolute: 0.1 10*3/uL (ref 0.0–0.1)
Basophils Relative: 1 %
EOS ABS: 0.3 10*3/uL (ref 0.0–0.5)
Eosinophils Relative: 4 %
HCT: 38.6 % (ref 36.0–46.0)
Hemoglobin: 12.7 g/dL (ref 12.0–15.0)
Immature Granulocytes: 0 %
LYMPHS ABS: 3.5 10*3/uL (ref 0.7–4.0)
Lymphocytes Relative: 39 %
MCH: 30.9 pg (ref 26.0–34.0)
MCHC: 32.9 g/dL (ref 30.0–36.0)
MCV: 93.9 fL (ref 80.0–100.0)
Monocytes Absolute: 0.7 10*3/uL (ref 0.1–1.0)
Monocytes Relative: 8 %
Neutro Abs: 4.4 10*3/uL (ref 1.7–7.7)
Neutrophils Relative %: 48 %
Platelets: 352 10*3/uL (ref 150–400)
RBC: 4.11 MIL/uL (ref 3.87–5.11)
RDW: 14.2 % (ref 11.5–15.5)
WBC: 9 10*3/uL (ref 4.0–10.5)
nRBC: 0 % (ref 0.0–0.2)

## 2018-11-10 LAB — COMPREHENSIVE METABOLIC PANEL
ALBUMIN: 4.1 g/dL (ref 3.5–5.0)
ALT: 18 U/L (ref 0–44)
ANION GAP: 9 (ref 5–15)
AST: 33 U/L (ref 15–41)
Alkaline Phosphatase: 61 U/L (ref 38–126)
BUN: 12 mg/dL (ref 8–23)
CO2: 24 mmol/L (ref 22–32)
Calcium: 9.3 mg/dL (ref 8.9–10.3)
Chloride: 105 mmol/L (ref 98–111)
Creatinine, Ser: 1.11 mg/dL — ABNORMAL HIGH (ref 0.44–1.00)
GFR calc Af Amer: 58 mL/min — ABNORMAL LOW (ref >60)
GFR calc non Af Amer: 50 mL/min — ABNORMAL LOW (ref >60)
GLUCOSE: 95 mg/dL (ref 70–99)
POTASSIUM: 3.6 mmol/L (ref 3.5–5.1)
Sodium: 138 mmol/L (ref 135–145)
Total Bilirubin: 0.7 mg/dL (ref 0.3–1.2)
Total Protein: 7.1 g/dL (ref 6.5–8.1)

## 2018-11-10 LAB — ETHANOL

## 2018-11-10 LAB — RAPID URINE DRUG SCREEN, HOSP PERFORMED
Amphetamines: NOT DETECTED
Barbiturates: NOT DETECTED
Benzodiazepines: NOT DETECTED
Cocaine: NOT DETECTED
Opiates: NOT DETECTED
Tetrahydrocannabinol: POSITIVE — AB

## 2018-11-10 LAB — URINALYSIS, ROUTINE W REFLEX MICROSCOPIC
BILIRUBIN URINE: NEGATIVE
Glucose, UA: NEGATIVE mg/dL
Hgb urine dipstick: NEGATIVE
Ketones, ur: 5 mg/dL — AB
Leukocytes,Ua: NEGATIVE
Nitrite: NEGATIVE
Protein, ur: NEGATIVE mg/dL
Specific Gravity, Urine: 1.005 (ref 1.005–1.030)
pH: 6 (ref 5.0–8.0)

## 2018-11-10 LAB — I-STAT TROPONIN, ED
TROPONIN I, POC: 0 ng/mL (ref 0.00–0.08)
Troponin i, poc: 0.01 ng/mL (ref 0.00–0.08)

## 2018-11-10 LAB — LIPASE, BLOOD: Lipase: 26 U/L (ref 11–51)

## 2018-11-10 MED ORDER — SODIUM CHLORIDE 0.9 % IV BOLUS
1000.0000 mL | Freq: Once | INTRAVENOUS | Status: AC
  Administered 2018-11-10: 1000 mL via INTRAVENOUS

## 2018-11-10 MED ORDER — ONDANSETRON HCL 4 MG/2ML IJ SOLN
4.0000 mg | Freq: Once | INTRAMUSCULAR | Status: AC
  Administered 2018-11-10: 4 mg via INTRAVENOUS
  Filled 2018-11-10: qty 2

## 2018-11-10 NOTE — ED Triage Notes (Signed)
Per EMS pt was at home with family. Family reports pt went to rise from dining room table when she became incoherent and shaky. Per EMS family reports pt was in this state for about 45 minutes after which she returned to baseline. Pt vomited X1. Per EMS pt denies CP; stroke screen was negative, and no orthostatic hypotension

## 2018-11-10 NOTE — ED Notes (Signed)
Pt is aware she needs a urine sample. A potty chair is placed in her room for her to use.

## 2018-11-10 NOTE — ED Provider Notes (Signed)
[facility] EMERGENCY DEPARTMENT Provider Note   CSN: [id number] Arrival date & time: 11/10/18  1831    History   Chief Complaint Chief Complaint  Patient presents with  . Near Syncope    HPI Brandi Harrington is a 73 y.o. female.     HPI   Presents with concern for near syncope A few friends stopped by, had a few beers and they were smoking marijuana Was sitting at the table, chatting, then started feeling bloated, like wanted to throw up and couldn't .  Walked back to bedroom to lay down and then to the kitchen and then threw up, not sure how many times.  Mostly clear emesis.  No bloody emesis.  Reports brother said she passed out, not sure if she fell, she does not remember.    Discussed with daughter, [name], who reports she walked into the kitchen, sat in a chair, and then appeared tremulous. Reports she was shaking all over on both sides, but did not appear to have LOC. She appeared to shake and then looked like she was going to fall over but appeared awake.  She then began vomiting.      Past Medical History:  Diagnosis Date  . Hyperlipidemia   . Hypertension   . Left hip pain 11/09/2017  . Osteopenia   . TMJ syndrome   . Undiagnosed cardiac murmurs   . Vertigo 09/14/2016  . Vitamin D deficiency     Patient Active Problem List   Diagnosis Date Noted  . Undiagnosed cardiac murmurs 03/03/2017  . DDD (degenerative disc disease), cervical 11/17/2016  . Unilateral hearing loss, right 10/20/2016  . Vertigo 10/20/2016  . Tinnitus, right ear 09/12/2016  . BMI 29.0-29.9,adult 06/25/2016  . Facial paresthesia 02/26/2015  . Lipoma of back 02/26/2015  . Tobacco use 01/18/2015  . Hepatitis C antibody test positive 02/15/2013  . HTN (hypertension) 05/12/2012  . Hyperlipidemia 05/12/2012  . Osteopenia   . TMJ syndrome   . Vitamin D deficiency     Past Surgical History:  Procedure Laterality Date  . COLONOSCOPY    . TUBAL LIGATION       OB  History   No obstetric history on file.      Home Medications    Prior to Admission medications   Medication Sig Start Date End Date Taking? Authorizing Provider  amLODipine (NORVASC) 10 MG tablet Take 1 tablet (10 mg total) by mouth daily. 02/15/18  Yes Harrison Mons, PA  aspirin EC 81 MG tablet Take 81 mg by mouth daily.   Yes [provider]  fish oil-omega-3 fatty acids 1000 MG capsule Take 1,200 mg by mouth daily.    Yes [provider]  lovastatin (MEVACOR) 40 MG tablet Take 1 tablet (40 mg total) by mouth at bedtime. 02/15/18  Yes Jeffery, Domingo Mend, PA  meclizine (ANTIVERT) 25 MG tablet Take 1 tablet (25 mg total) by mouth 3 (three) times daily as needed for dizziness. 02/15/18  Yes Jeffery, Domingo Mend, PA  meloxicam (MOBIC) 15 MG tablet Take 1 tablet (15 mg total) by mouth daily. 02/15/18  Yes Jeffery, Chelle, PA  alendronate (FOSAMAX) 70 MG tablet TAKE ONE TABLET BY MOUTH ONCE A WEEK WITH  A  FULL  GLASS  OF  WATER  ON  AN  EMPTY  STOMACH Patient not taking: Reported on 11/10/2018 06/02/17   Harrison Mons, PA  ketoconazole (NIZORAL) 2 % cream Apply 1 application topically 2 (two) times daily. Patient not taking: Reported  on 11/10/2018 02/15/18   Harrison Mons, PA    Family History Family History  Problem Relation Age of Onset  . Diabetes Sister   . Scoliosis Sister   . Arthritis Sister   . Alcohol abuse Daughter   . Breast cancer Daughter   . Heart disease Son        dysrrhythmia  . Transient ischemic attack Mother     Social History Social History   Tobacco Use  . Smoking status: Current Some Day Smoker    Packs/day: 0.30    Types: Cigarettes  . Smokeless tobacco: Never Used  . Tobacco comment: may smoke 1 cigarette in the evening after work  Substance Use Topics  . Alcohol use: Yes    Alcohol/week: 5.0 standard drinks    Types: 5 Cans of beer per week  . Drug use: No     Allergies   Patient has no known allergies.   Review of  Systems Review of Systems  Constitutional: Positive for fatigue. Negative for fever.  Respiratory: Negative for cough (every once in a while) and shortness of breath.   Cardiovascular: Negative for chest pain.  Gastrointestinal: Positive for abdominal distention, nausea and vomiting. Negative for constipation and diarrhea.  Genitourinary: Negative for dysuria.  Skin: Negative for rash.  Neurological: Positive for syncope. Negative for facial asymmetry, weakness, numbness and headaches.     Physical Exam Updated Vital Signs BP 139/73   Pulse 69   Temp 97.7 F (36.5 C) (Oral)   Resp (!) 21   Ht 5\' 1"  (1.549 m)   Wt 70.3 kg   SpO2 99%   BMI 29.29 kg/m   Physical Exam Vitals signs and nursing note reviewed.  Constitutional:      General: She is not in acute distress.    Appearance: She is well-developed. She is not diaphoretic.  HENT:     Head: Normocephalic and atraumatic.  Eyes:     General: No visual field deficit.    Conjunctiva/sclera: Conjunctivae normal.  Neck:     Musculoskeletal: Normal range of motion.  Cardiovascular:     Rate and Rhythm: Normal rate and regular rhythm.     Heart sounds: Normal heart sounds. No murmur. No friction rub. No gallop.   Pulmonary:     Effort: Pulmonary effort is normal. No respiratory distress.     Breath sounds: Normal breath sounds. No wheezing or rales.  Abdominal:     General: There is no distension.     Palpations: Abdomen is soft.     Tenderness: There is no abdominal tenderness. There is no guarding.  Musculoskeletal:        General: No tenderness.  Skin:    General: Skin is warm and dry.     Findings: No erythema or rash.  Neurological:     Mental Status: She is alert and oriented to person, place, and time.     GCS: GCS eye subscore is 4. GCS verbal subscore is 5. GCS motor subscore is 6.     Cranial Nerves: Cranial nerves are intact. No cranial nerve deficit, dysarthria or facial asymmetry.     Sensory: Sensation is  intact. No sensory deficit.     Motor: Motor function is intact. No weakness.     Coordination: Coordination normal.     Gait: Gait normal.      ED Treatments / Results  Labs (all labs ordered are listed, but only abnormal results are displayed) Labs Reviewed  COMPREHENSIVE METABOLIC PANEL -  Abnormal; Notable for the following components:      Result Value   Creatinine, Ser 1.11 (*)    GFR calc non Af Amer 50 (*)    GFR calc Af Amer 58 (*)    All other components within normal limits  URINALYSIS, ROUTINE W REFLEX MICROSCOPIC - Abnormal; Notable for the following components:   Color, Urine STRAW (*)    Ketones, ur 5 (*)    All other components within normal limits  RAPID URINE DRUG SCREEN, HOSP PERFORMED - Abnormal; Notable for the following components:   Tetrahydrocannabinol POSITIVE (*)    All other components within normal limits  CBC WITH DIFFERENTIAL/PLATELET  LIPASE, BLOOD  ETHANOL  I-STAT TROPONIN, ED  I-STAT TROPONIN, ED    EKG EKG Interpretation  Date/Time:  Thursday November 10 2018 18:32:17 EST Ventricular Rate:  71 PR Interval:    QRS Duration: 94 QT Interval:  399 QTC Calculation: 434 R Axis:   -27 Text Interpretation:  Sinus rhythm Left atrial enlargement Borderline left axis deviation RSR' in V1 or V2, probably normal variant Probable anteroseptal infarct, old No significant change since last tracing Confirmed by Gareth Morgan [phone]) on 11/10/2018 6:39:42 PM   Radiology Ct Head Wo Contrast  Result Date: 11/10/2018 CLINICAL DATA:  Headache EXAM: CT HEAD WITHOUT CONTRAST TECHNIQUE: Contiguous axial images were obtained from the base of the skull through the vertex without intravenous contrast. COMPARISON:  None. FINDINGS: Brain: No evidence of acute infarction, hemorrhage, hydrocephalus, extra-axial collection or mass lesion/mass effect. Vascular: No hyperdense vessel or unexpected calcification. Carotid vascular calcification Skull: Normal. Negative for  fracture or focal lesion. Sinuses/Orbits: Fluid level in the sphenoid and maxillary sinuses. Mucosal thickening in the ethmoid sinuses Other: None IMPRESSION: 1. Negative non contrasted CT appearance of the brain 2. Sinusitis Electronically Signed   By: Donavan Foil M.D.   On: 11/10/2018 20:04    Procedures Procedures (including critical care time)  Medications Ordered in ED Medications  sodium chloride 0.9 % bolus 1,000 mL (0 mLs Intravenous Stopped 11/10/18 2019)  ondansetron (ZOFRAN) injection 4 mg (4 mg Intravenous Given 11/10/18 1926)     Initial Impression / Assessment and Plan / ED Course  I have reviewed the triage vital signs and the nursing notes.  Pertinent labs & imaging results that were available during my care of the patient were reviewed by me and considered in my medical decision making (see chart for details).        73 year old female with a history of hypertension, hyperlipidemia presents with concern for episode of near syncope and emesis.  On initial history, patient describes drinking alcohol, and being around marijuana prior to this episode occurring, however on reevaluation she denies alcohol use.  EKG was done which showed no acute abnormalities.  Delta troponins are both negative, and have low suspicion for ACS.  No dyspnea, no hypoxia, no asymmetric leg swelling, have low suspicion for PE.  No signs of aortic dissection by history or physical exam.  She has a normal neurologic exam, including normal coordination, visual fields, and have low suspicion for CVA.  Urinalysis shows no sign of UTI.  Abdominal exam benign, doubt acute intraabdominal pathology, doubt AAA.  Given patient awake with shaking, does not sound consistent with seizure. UDS positive for THC. ETOH negative although she did initially report ETOH use.  CT head does not show signs of abnormalities.   She has remained hemodynamically stable and feel she is appropriate for outpatient follow up with  PCP  and Cardiology, strict return precautions.     Final Clinical Impressions(s) / ED Diagnoses   Final diagnoses:  Syncope, unspecified syncope type  Nausea and vomiting, intractability of vomiting not specified, unspecified vomiting type    ED Discharge Orders    None       Gareth Morgan, MD 11/11/18 [phone]

## 2018-11-11 NOTE — ED Notes (Addendum)
Pt discharged from ED; instructions provided ; Pt encouraged to return to ED if symptoms worsen and to f/u with PCP; Pt verbalized understanding of all instructions; No COW available for signature

## 2018-12-05 ENCOUNTER — Other Ambulatory Visit: Payer: Self-pay | Admitting: Physician Assistant

## 2018-12-05 DIAGNOSIS — Z1231 Encounter for screening mammogram for malignant neoplasm of breast: Secondary | ICD-10-CM

## 2018-12-14 ENCOUNTER — Ambulatory Visit: Payer: Medicare HMO | Admitting: Neurology

## 2019-01-02 ENCOUNTER — Ambulatory Visit: Payer: Medicare HMO

## 2019-01-19 DIAGNOSIS — I10 Essential (primary) hypertension: Secondary | ICD-10-CM | POA: Diagnosis not present

## 2019-01-19 DIAGNOSIS — R42 Dizziness and giddiness: Secondary | ICD-10-CM | POA: Diagnosis not present

## 2019-01-19 DIAGNOSIS — R413 Other amnesia: Secondary | ICD-10-CM | POA: Diagnosis not present

## 2019-01-30 ENCOUNTER — Other Ambulatory Visit: Payer: Medicare HMO

## 2019-02-02 ENCOUNTER — Ambulatory Visit (INDEPENDENT_AMBULATORY_CARE_PROVIDER_SITE_OTHER): Payer: Medicare HMO | Admitting: Neurology

## 2019-02-02 ENCOUNTER — Ambulatory Visit: Payer: Self-pay | Admitting: Neurology

## 2019-02-02 ENCOUNTER — Encounter: Payer: Self-pay | Admitting: Neurology

## 2019-02-02 ENCOUNTER — Telehealth: Payer: Self-pay | Admitting: Neurology

## 2019-02-02 ENCOUNTER — Other Ambulatory Visit: Payer: Self-pay

## 2019-02-02 ENCOUNTER — Encounter: Payer: Self-pay | Admitting: *Deleted

## 2019-02-02 DIAGNOSIS — R413 Other amnesia: Secondary | ICD-10-CM

## 2019-02-02 NOTE — Progress Notes (Signed)
PATIENT: Brandi Harrington DOB: March 17, 1946  Virtual Visit via video  I connected with Adolph Pollack on 02/02/19 at  by video and verified that I am speaking with the correct person using two identifiers.   I discussed the limitations, risks, security and privacy concerns of performing an evaluation and management service by video and the availability of in person appointments. I also discussed with the patient that there may be a patient responsible charge related to this service. The patient expressed understanding and agreed to proceed.  HISTORICAL  Brandi Harrington is a 73 years old female, with her sister Johnnette Litter, seen in request by her primary care physician Dr. Bernerd Limbo for evaluation of memory loss.  I have reviewed and summarized the referring note from the referring physician.  She has past medical history of hypertension, hyperlipidemia, retired from daycare at age 36, there was no family history of memory loss.    She began to experience gradual onset of memory loss since 2018, gradually getting worse especially since 2019, she tends to misplace things, forgot conversations, there was no family history of memory loss, she denies lateralized motor or sensory deficit  She presented to the emergency room on November 10, 2018 for evaluation of near syncope, this happened after she drink beers, smoking marijuana, then she began to feel bloated, throwing up, was noted by her family members, has whole body tremors, shaking all over,   I personally reviewed CT head without contrast, there was no acute abnormality  Laboratory evaluations UDS was positive for marijuana, negative troponin, CMP showed creatinine of 1.1, CBC was normal hemoglobin of 12.7   Observations/Objective: I have reviewed problem lists, medications, allergies.  MMSE - Mini Mental State Exam 02/02/2019  Orientation to time 4  Orientation to Place 5  Registration 3  Attention/ Calculation 2  Recall 0  Language- name 2  objects 2  Language- repeat 1  Language- follow 3 step command 3  Language- read & follow direction 1  Write a sentence 0  Copy design 0  Total score 21/28    Assessment and Plan: Mild cognitive impairment:  MRI brain wo  TSH, B12 to rule out treatable etiology  Follow Up Instructions:  With NP Sarah in 4 months    I discussed the assessment and treatment plan with the patient. The patient was provided an opportunity to ask questions and all were answered. The patient agreed with the plan and demonstrated an understanding of the instructions.   The patient was advised to call back or seek an in-person evaluation if the symptoms worsen or if the condition fails to improve as anticipated.  I provided 15minutes of non-face-to-face time during this encounter.  REVIEW OF SYSTEMS: Full 14 system review of systems performed and notable only for as above. All other review of systems were negative.  ALLERGIES: No Known Allergies  HOME MEDICATIONS: Current Outpatient Medications  Medication Sig Dispense Refill  . alendronate (FOSAMAX) 70 MG tablet TAKE ONE TABLET BY MOUTH ONCE A WEEK WITH  A  FULL  GLASS  OF  WATER  ON  AN  EMPTY  STOMACH 12 tablet 3  . amLODipine (NORVASC) 10 MG tablet Take 1 tablet (10 mg total) by mouth daily. 90 tablet 3  . aspirin EC 81 MG tablet Take 81 mg by mouth daily.    . fish oil-omega-3 fatty acids 1000 MG capsule Take 1,200 mg by mouth daily.     Marland Kitchen lovastatin (MEVACOR) 40 MG  tablet Take 1 tablet (40 mg total) by mouth at bedtime. 90 tablet 3  . meclizine (ANTIVERT) 25 MG tablet Take 1 tablet (25 mg total) by mouth 3 (three) times daily as needed for dizziness. 30 tablet 5  . meloxicam (MOBIC) 15 MG tablet Take 1 tablet (15 mg total) by mouth daily. 90 tablet 1   No current facility-administered medications for this visit.     PAST MEDICAL HISTORY: Past Medical History:  Diagnosis Date  . Hyperlipidemia   . Hypertension   . Left hip pain 11/09/2017   . Memory loss   . Osteopenia   . Tinnitus   . TMJ syndrome   . Undiagnosed cardiac murmurs   . Vertigo 09/14/2016  . Vitamin D deficiency     PAST SURGICAL HISTORY: Past Surgical History:  Procedure Laterality Date  . COLONOSCOPY    . TUBAL LIGATION      FAMILY HISTORY: Family History  Problem Relation Age of Onset  . Diabetes Sister   . Scoliosis Sister   . Arthritis Sister   . Alcohol abuse Daughter   . Breast cancer Daughter   . Heart disease Son        dysrrhythmia  . Transient ischemic attack Mother   . Other Father        unsure of history    SOCIAL HISTORY:   Social History   Socioeconomic History  . Marital status: Divorced    Spouse name: n/a  . Number of children: 2  . Years of education: 12th grade  . Highest education level: Not on file  Occupational History  . Occupation: Retired    Fish farm manager: [employer]    Comment: [employer]  . Financial resource strain: Not on file  . Food insecurity:    Worry: Not on file    Inability: Not on file  . Transportation needs:    Medical: Not on file    Non-medical: Not on file  Tobacco Use  . Smoking status: Current Some Day Smoker    Packs/day: 0.30    Types: Cigarettes  . Smokeless tobacco: Never Used  . Tobacco comment: may smoke 1 cigarette in the evening after work  Substance and Sexual Activity  . Alcohol use: Not Currently    Alcohol/week: 5.0 standard drinks    Types: 5 Cans of beer per week  . Drug use: No  . Sexual activity: Yes    Partners: Male    Birth control/protection: Post-menopausal  Lifestyle  . Physical activity:    Days per week: Not on file    Minutes per session: Not on file  . Stress: Not on file  Relationships  . Social connections:    Talks on phone: Not on file    Gets together: Not on file    Attends religious service: Not on file    Active member of club or organization: Not on file    Attends meetings of clubs or organizations: Not on file     Relationship status: Not on file  . Intimate partner violence:    Fear of current or ex partner: Not on file    Emotionally abused: Not on file    Physically abused: Not on file    Forced sexual activity: Not on file  Other Topics Concern  . Not on file  Social History Narrative   House on the [place] [address] bankruptcy (not her business)-couldn't get a loan modification.  Ex-husband s/p CVA x several,  residual left hemiparesis, DM diagnosed at age 10, ASCVD (severe), s/p CABG . He's ill-humored and "mean." He moved into    an assisted living facility fall 2014, and they divorced in 10/2014.   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      02/02/2019:     1 cup coffee per day.   Her daughter lives with her.  Brother lives next door.  Sister lives on same street.   Right-handed.    Marcial Pacas, M.D. Ph.D.  [facility] [address] [place],  [address] Ph: [phone] Fax: [phone]  CC: Bernerd Limbo, MD

## 2019-02-02 NOTE — Telephone Encounter (Signed)
[employer] medicare order sent to [facility]. They will obtain the auth and reach out to the pt to schedule.  °

## 2019-02-15 ENCOUNTER — Other Ambulatory Visit: Payer: Self-pay

## 2019-02-15 ENCOUNTER — Other Ambulatory Visit (INDEPENDENT_AMBULATORY_CARE_PROVIDER_SITE_OTHER): Payer: Self-pay

## 2019-02-15 ENCOUNTER — Ambulatory Visit
Admission: RE | Admit: 2019-02-15 | Discharge: 2019-02-15 | Disposition: A | Discharge status: Home / Self Care | Payer: Medicare HMO | Source: Ambulatory Visit | Attending: Neurology | Admitting: Neurology

## 2019-02-15 DIAGNOSIS — R413 Other amnesia: Secondary | ICD-10-CM

## 2019-02-15 DIAGNOSIS — Z0289 Encounter for other administrative examinations: Secondary | ICD-10-CM

## 2019-02-15 NOTE — Telephone Encounter (Signed)
[employer] medicare Josem Kaufmann: [id number] (exp. 02/14/19 to 08/13/19) patient is scheduled at [facility] for 02/15/19.

## 2019-02-16 ENCOUNTER — Telehealth: Payer: Self-pay | Admitting: *Deleted

## 2019-02-16 LAB — TSH: TSH: 1.45 u[IU]/mL (ref 0.450–4.500)

## 2019-02-16 LAB — VITAMIN B12: Vitamin B-12: 396 pg/mL (ref 232–1245)

## 2019-02-16 NOTE — Telephone Encounter (Signed)
Spoke to patient and notified of her of the MRI results.

## 2019-02-16 NOTE — Telephone Encounter (Signed)
-----   Message from Marcial Pacas, MD sent at 02/16/2019 11:57 AM EDT ----- Please call pt for normal MRI brain.

## 2019-02-18 ENCOUNTER — Telehealth: Payer: Self-pay | Admitting: *Deleted

## 2019-02-18 NOTE — Telephone Encounter (Signed)
-----   Message from Marcial Pacas, MD sent at 02/17/2019  5:13 PM EDT ----- Please call patient for normal laboratory result

## 2019-02-20 NOTE — Telephone Encounter (Signed)
Spoke to patient and informed her of the lab results 

## 2019-03-03 DIAGNOSIS — Z8673 Personal history of transient ischemic attack (TIA), and cerebral infarction without residual deficits: Secondary | ICD-10-CM | POA: Diagnosis not present

## 2019-03-03 DIAGNOSIS — M1990 Unspecified osteoarthritis, unspecified site: Secondary | ICD-10-CM | POA: Diagnosis not present

## 2019-03-03 DIAGNOSIS — I10 Essential (primary) hypertension: Secondary | ICD-10-CM | POA: Diagnosis not present

## 2019-03-03 DIAGNOSIS — E785 Hyperlipidemia, unspecified: Secondary | ICD-10-CM | POA: Diagnosis not present

## 2019-03-03 DIAGNOSIS — K219 Gastro-esophageal reflux disease without esophagitis: Secondary | ICD-10-CM | POA: Diagnosis not present

## 2019-03-06 ENCOUNTER — Other Ambulatory Visit: Payer: Medicare HMO

## 2019-03-06 ENCOUNTER — Ambulatory Visit: Payer: Medicare HMO

## 2019-03-08 ENCOUNTER — Ambulatory Visit
Admission: RE | Admit: 2019-03-08 | Discharge: 2019-03-08 | Disposition: A | Discharge status: Home / Self Care | Payer: Medicare HMO | Source: Ambulatory Visit | Attending: Physician Assistant | Admitting: Physician Assistant

## 2019-03-08 ENCOUNTER — Other Ambulatory Visit: Payer: Self-pay

## 2019-03-08 DIAGNOSIS — Z1231 Encounter for screening mammogram for malignant neoplasm of breast: Secondary | ICD-10-CM

## 2019-03-08 DIAGNOSIS — Z13820 Encounter for screening for osteoporosis: Secondary | ICD-10-CM | POA: Diagnosis not present

## 2019-03-08 DIAGNOSIS — E2839 Other primary ovarian failure: Secondary | ICD-10-CM

## 2019-03-08 DIAGNOSIS — Z780 Asymptomatic menopausal state: Secondary | ICD-10-CM | POA: Diagnosis not present

## 2019-04-06 DIAGNOSIS — I10 Essential (primary) hypertension: Secondary | ICD-10-CM | POA: Diagnosis not present

## 2019-04-06 DIAGNOSIS — R1031 Right lower quadrant pain: Secondary | ICD-10-CM | POA: Diagnosis not present

## 2019-04-25 DIAGNOSIS — I10 Essential (primary) hypertension: Secondary | ICD-10-CM | POA: Diagnosis not present

## 2019-04-25 DIAGNOSIS — D259 Leiomyoma of uterus, unspecified: Secondary | ICD-10-CM | POA: Diagnosis not present

## 2019-04-25 DIAGNOSIS — N200 Calculus of kidney: Secondary | ICD-10-CM | POA: Diagnosis not present

## 2019-04-25 DIAGNOSIS — N8110 Cystocele, unspecified: Secondary | ICD-10-CM | POA: Diagnosis not present

## 2019-04-25 DIAGNOSIS — R1031 Right lower quadrant pain: Secondary | ICD-10-CM | POA: Diagnosis not present

## 2019-04-25 DIAGNOSIS — N2889 Other specified disorders of kidney and ureter: Secondary | ICD-10-CM | POA: Diagnosis not present

## 2019-05-23 ENCOUNTER — Ambulatory Visit (INDEPENDENT_AMBULATORY_CARE_PROVIDER_SITE_OTHER): Payer: Medicare HMO | Admitting: Gastroenterology

## 2019-05-23 ENCOUNTER — Encounter: Payer: Self-pay | Admitting: Gastroenterology

## 2019-05-23 VITALS — BP 140/80 | HR 83 | Temp 98.5°F | Ht 61.0 in | Wt 136.0 lb

## 2019-05-23 DIAGNOSIS — R1031 Right lower quadrant pain: Secondary | ICD-10-CM | POA: Diagnosis not present

## 2019-05-23 NOTE — Patient Instructions (Signed)
If you are age 71 or older, your body mass index should be between 23-30. Your Body mass index is 25.7 kg/m. If this is out of the aforementioned range listed, please consider follow up with your Primary Care Provider.  If you are age 64 or younger, your body mass index should be between 19-25. Your Body mass index is 25.7 kg/m. If this is out of the aformentioned range listed, please consider follow up with your Primary Care Provider.   It was a pleasure to see you today!  Dr. Danis  

## 2019-05-23 NOTE — Progress Notes (Signed)
[facility] Consult Note:  History: Brandi Harrington 05/23/2019  Referring provider: Bernerd Limbo, MD - Daphane Shepherd, [place]  Reason for consult/chief complaint: Abdominal Pain (pt reports intermittent mostly lower abdominal pain; nothing seems to make it better or worse; she states that from time to time her stools look "dark"; )   Subjective  HPI: Primary care note dated 04/25/2019 indicates onset of right lower quadrant pain mid July, work-up at that time included normal CBC and urinalysis.  CT abdomen and pelvis was recommended, but patient unable to do so due to lack of transportation.Then went for it later. She has had recurrent episodes of right lower quadrant pain since then that apparently resolved.  At the time of recent PCP visit, it was a burning pain across the lower abdomen.  Teona is a somewhat tangential historian but describes intermittent right or left groin pelvic pain that is worse with certain movements.  Worse when getting up from a chair or walking.  She has no associated symptoms such as fever nausea vomiting or altered bowel habits.  She denies rectal bleeding or vaginal bleeding.  Sometimes she believes her stool looks "dark", but no black tarry stool.  Screening colonoscopy with me January 2019, only hyperplastic polyp.  ROS:  Review of Systems  Constitutional: Negative for appetite change and unexpected weight change.  HENT: Negative for mouth sores and voice change.   Eyes: Negative for pain and redness.  Respiratory: Negative for cough and shortness of breath.   Cardiovascular: Negative for chest pain and palpitations.  Genitourinary: Negative for dysuria and hematuria.  Musculoskeletal: Positive for arthralgias. Negative for myalgias.  Skin: Negative for pallor and rash.  Neurological: Negative for weakness and headaches.  Hematological: Negative for adenopathy.     Past Medical History: Past Medical History:  Diagnosis Date  .  Hyperlipidemia   . Hypertension   . Left hip pain 11/09/2017  . Memory loss   . Osteopenia   . Tinnitus   . TMJ syndrome   . Undiagnosed cardiac murmurs   . Vertigo 09/14/2016  . Vitamin D deficiency      Past Surgical History: Past Surgical History:  Procedure Laterality Date  . COLONOSCOPY    . TUBAL LIGATION       Family History: Family History  Problem Relation Age of Onset  . Diabetes Sister   . Scoliosis Sister   . Arthritis Sister   . Alcohol abuse Daughter   . Breast cancer Daughter   . Heart disease Son        dysrrhythmia  . Transient ischemic attack Mother   . Other Father        unsure of history    Social History: Social History   Socioeconomic History  . Marital status: Divorced    Spouse name: n/a  . Number of children: 2  . Years of education: 12th grade  . Highest education level: Not on file  Occupational History  . Occupation: Retired    Fish farm manager: [employer]    Comment: [place]  . Financial resource strain: Not on file  . Food insecurity    Worry: Not on file    Inability: Not on file  . Transportation needs    Medical: Not on file    Non-medical: Not on file  Tobacco Use  . Smoking status: Current Some Day Smoker    Packs/day: 0.30    Types: Cigarettes  . Smokeless tobacco: Never Used  .  Tobacco comment: may smoke 1 cigarette in the evening after work  Substance and Sexual Activity  . Alcohol use: Not Currently    Alcohol/week: 5.0 standard drinks    Types: 5 Cans of beer per week  . Drug use: No  . Sexual activity: Yes    Partners: Male    Birth control/protection: Post-menopausal  Lifestyle  . Physical activity    Days per week: Not on file    Minutes per session: Not on file  . Stress: Not on file  Relationships  . Social Herbalist on phone: Not on file    Gets together: Not on file    Attends religious service: Not on file    Active member of club or organization: Not on file     Attends meetings of clubs or organizations: Not on file    Relationship status: Not on file  Other Topics Concern  . Not on file  Social History Narrative   House on the [address] bankruptcy (not her business)-couldn't get a loan modification.  Ex-husband s/p CVA x several, residual left hemiparesis, DM diagnosed at age 22, ASCVD (severe), s/p CABG . He's ill-humored and "mean." He moved into    an assisted living facility fall 2014, and they divorced in 10/2014.   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      02/02/2019:     1 cup coffee per day.   Her daughter lives with her.  Brother lives next door.  Sister lives on same street.   Right-handed.    Allergies: No Known Allergies  Outpatient Meds: Current Outpatient Medications  Medication Sig Dispense Refill  . alendronate (FOSAMAX) 70 MG tablet TAKE ONE TABLET BY MOUTH ONCE A WEEK WITH  A  FULL  GLASS  OF  WATER  ON  AN  EMPTY  STOMACH 12 tablet 3  . amLODipine (NORVASC) 10 MG tablet Take 1 tablet (10 mg total) by mouth daily. 90 tablet 3  . aspirin EC 81 MG tablet Take 81 mg by mouth daily.    . fish oil-omega-3 fatty acids 1000 MG capsule Take 1,200 mg by mouth daily.     Marland Kitchen lovastatin (MEVACOR) 40 MG tablet Take 1 tablet (40 mg total) by mouth at bedtime. 90 tablet 3  . meclizine (ANTIVERT) 25 MG tablet Take 1 tablet (25 mg total) by mouth 3 (three) times daily as needed for dizziness. 30 tablet 5  . meloxicam (MOBIC) 15 MG tablet Take 1 tablet (15 mg total) by mouth daily. 90 tablet 1   No current facility-administered medications for this visit.       ___________________________________________________________________ Objective   Exam:  BP 140/80   Pulse 83   Temp 98.5 F (36.9 C)   Ht 5\' 1"  (1.549 m)   Wt 136 lb (61.7 kg)   BMI 25.70 kg/m    General: Pleasant and well-appearing  Eyes: sclera  anicteric, no redness  ENT: oral mucosa moist without lesions, no cervical or supraclavicular lymphadenopathy  CV: RRR without murmur, S1/S2, no JVD, no peripheral edema  Resp: clear to auscultation bilaterally, normal RR and effort noted  GI: soft, no tenderness, with active bowel sounds. No guarding or palpable organomegaly noted.  Skin; warm and dry, no rash or jaundice noted  Neuro: awake, alert and oriented x 3. Normal gross motor function and fluent speech She  indicates the pain is deep into the groin on both sides, and points to both hands.  That pain was reproduced when she was laying back on the exam table and sitting up. Labs:  04/25/2019:  WBC 8.2, hemoglobin 13.8, platelet 359, urinalysis normal except for low level proteinuria.  Radiologic Studies:  Report of CT abdomen and pelvis with IV contrast revealed normal appendix, fatty liver, 2 mm right hepatic lobe cyst, bilateral renal calculi and renal cortical scarring, aortic atherosclerosis without aneurysm, cystocele and uterine fibroids  Assessment: Encounter Diagnosis  Name Primary?  . RLQ abdominal pain Yes    This appears to be hip or pelvic pain referred to the lower abdomen.  No additional digestive testing planned.  Referred back to primary care.   Thank you for the courtesy of this consult.  Please call me with any questions or concerns.  Nelida Meuse III  CC: Referring provider noted above

## 2019-06-01 DIAGNOSIS — Z23 Encounter for immunization: Secondary | ICD-10-CM | POA: Diagnosis not present

## 2019-06-01 DIAGNOSIS — R1030 Lower abdominal pain, unspecified: Secondary | ICD-10-CM | POA: Diagnosis not present

## 2019-06-01 DIAGNOSIS — G8929 Other chronic pain: Secondary | ICD-10-CM | POA: Diagnosis not present

## 2019-06-01 DIAGNOSIS — M25552 Pain in left hip: Secondary | ICD-10-CM | POA: Diagnosis not present

## 2019-06-01 DIAGNOSIS — M25551 Pain in right hip: Secondary | ICD-10-CM | POA: Diagnosis not present

## 2019-06-01 DIAGNOSIS — I10 Essential (primary) hypertension: Secondary | ICD-10-CM | POA: Diagnosis not present

## 2019-07-06 DIAGNOSIS — R1030 Lower abdominal pain, unspecified: Secondary | ICD-10-CM | POA: Diagnosis not present

## 2019-07-06 DIAGNOSIS — R413 Other amnesia: Secondary | ICD-10-CM | POA: Diagnosis not present

## 2019-07-06 DIAGNOSIS — R69 Illness, unspecified: Secondary | ICD-10-CM | POA: Diagnosis not present

## 2019-07-06 DIAGNOSIS — I10 Essential (primary) hypertension: Secondary | ICD-10-CM | POA: Diagnosis not present

## 2019-07-06 DIAGNOSIS — Z7189 Other specified counseling: Secondary | ICD-10-CM | POA: Diagnosis not present

## 2019-07-18 ENCOUNTER — Other Ambulatory Visit: Payer: Self-pay

## 2019-07-18 ENCOUNTER — Emergency Department (HOSPITAL_BASED_OUTPATIENT_CLINIC_OR_DEPARTMENT_OTHER): Payer: Medicare HMO

## 2019-07-18 ENCOUNTER — Emergency Department (HOSPITAL_BASED_OUTPATIENT_CLINIC_OR_DEPARTMENT_OTHER)
Admission: EM | Admit: 2019-07-18 | Discharge: 2019-07-18 | Disposition: A | Discharge status: Home / Self Care | Payer: Medicare HMO | Attending: Emergency Medicine | Admitting: Emergency Medicine

## 2019-07-18 ENCOUNTER — Encounter (HOSPITAL_BASED_OUTPATIENT_CLINIC_OR_DEPARTMENT_OTHER): Payer: Self-pay | Admitting: Emergency Medicine

## 2019-07-18 DIAGNOSIS — I10 Essential (primary) hypertension: Secondary | ICD-10-CM | POA: Insufficient documentation

## 2019-07-18 DIAGNOSIS — E785 Hyperlipidemia, unspecified: Secondary | ICD-10-CM | POA: Insufficient documentation

## 2019-07-18 DIAGNOSIS — M1612 Unilateral primary osteoarthritis, left hip: Secondary | ICD-10-CM | POA: Diagnosis not present

## 2019-07-18 DIAGNOSIS — M79652 Pain in left thigh: Secondary | ICD-10-CM | POA: Insufficient documentation

## 2019-07-18 DIAGNOSIS — M25552 Pain in left hip: Secondary | ICD-10-CM | POA: Diagnosis not present

## 2019-07-18 DIAGNOSIS — R1030 Lower abdominal pain, unspecified: Secondary | ICD-10-CM | POA: Diagnosis not present

## 2019-07-18 DIAGNOSIS — F17210 Nicotine dependence, cigarettes, uncomplicated: Secondary | ICD-10-CM | POA: Diagnosis not present

## 2019-07-18 DIAGNOSIS — R1032 Left lower quadrant pain: Secondary | ICD-10-CM | POA: Diagnosis present

## 2019-07-18 DIAGNOSIS — M160 Bilateral primary osteoarthritis of hip: Secondary | ICD-10-CM | POA: Diagnosis not present

## 2019-07-18 DIAGNOSIS — Z79899 Other long term (current) drug therapy: Secondary | ICD-10-CM | POA: Diagnosis not present

## 2019-07-18 DIAGNOSIS — R69 Illness, unspecified: Secondary | ICD-10-CM | POA: Diagnosis not present

## 2019-07-18 LAB — COMPREHENSIVE METABOLIC PANEL
ALT: 17 U/L (ref 0–44)
AST: 31 U/L (ref 15–41)
Albumin: 4.4 g/dL (ref 3.5–5.0)
Alkaline Phosphatase: 72 U/L (ref 38–126)
Anion gap: 12 (ref 5–15)
BUN: 17 mg/dL (ref 8–23)
CO2: 22 mmol/L (ref 22–32)
Calcium: 9.5 mg/dL (ref 8.9–10.3)
Chloride: 104 mmol/L (ref 98–111)
Creatinine, Ser: 0.81 mg/dL (ref 0.44–1.00)
GFR calc Af Amer: >60 mL/min (ref >60)
GFR calc non Af Amer: >60 mL/min (ref >60)
Glucose, Bld: 82 mg/dL (ref 70–99)
Potassium: 4.4 mmol/L (ref 3.5–5.1)
Sodium: 138 mmol/L (ref 135–145)
Total Bilirubin: 0.8 mg/dL (ref 0.3–1.2)
Total Protein: 8 g/dL (ref 6.5–8.1)

## 2019-07-18 LAB — CBC
HCT: 41.5 % (ref 36.0–46.0)
Hemoglobin: 14.1 g/dL (ref 12.0–15.0)
MCH: 33 pg (ref 26.0–34.0)
MCHC: 34 g/dL (ref 30.0–36.0)
MCV: 97.2 fL (ref 80.0–100.0)
Platelets: 316 10*3/uL (ref 150–400)
RBC: 4.27 MIL/uL (ref 3.87–5.11)
RDW: 12.8 % (ref 11.5–15.5)
WBC: 7 10*3/uL (ref 4.0–10.5)
nRBC: 0 % (ref 0.0–0.2)

## 2019-07-18 MED ORDER — HYDROCODONE-ACETAMINOPHEN 5-325 MG PO TABS: 1.0000 | ORAL_TABLET | Freq: Four times a day (QID) | ORAL | 0 refills | Status: DC | PRN

## 2019-07-18 MED FILL — HYDROCODON-APAP 5-325: 5-325 | 3 days supply | Qty: 14 | Fill #0

## 2019-07-18 NOTE — Discharge Instructions (Addendum)
Follow-up with orthopedics.  X-ray showed significant degenerative changes in both hips.  Suspect that the pain that she currently having is from that left hip.  No signs of fracture.  Take the pain medication as directed.  May just want to take it at bedtime.  Labs today without any significant abnormalities.

## 2019-07-18 NOTE — ED Provider Notes (Signed)
[facility] EMERGENCY DEPARTMENT Provider Note   CSN: [id number] Arrival date & time: 07/18/19  [id number]     History   Chief Complaint Chief Complaint  Patient presents with  . Groin Pain    HPI DEA Brandi Harrington is a 73 y.o. female.     Patient with complaint of left-sided groin pain radiates into the upper part of the thigh sometimes down to the knee for 1 month.  No fall or injury.  No radiation of pain into the leg.  No numbness or weakness in the foot.  Occasionally has similar pain on the right side.  Patient without any GI symptoms.  No nausea or vomiting or diarrhea.  Patient denies any abdominal pain.     Past Medical History:  Diagnosis Date  . Hyperlipidemia   . Hypertension   . Left hip pain 11/09/2017  . Memory loss   . Osteopenia   . Tinnitus   . TMJ syndrome   . Undiagnosed cardiac murmurs   . Vertigo 09/14/2016  . Vitamin D deficiency     Patient Active Problem List   Diagnosis Date Noted  . Memory loss 02/02/2019  . Undiagnosed cardiac murmurs 03/03/2017  . DDD (degenerative disc disease), cervical 11/17/2016  . Unilateral hearing loss, right 10/20/2016  . Vertigo 10/20/2016  . Tinnitus, right ear 09/12/2016  . BMI 29.0-29.9,adult 06/25/2016  . Facial paresthesia 02/26/2015  . Lipoma of back 02/26/2015  . Tobacco use 01/18/2015  . Hepatitis C antibody test positive 02/15/2013  . HTN (hypertension) 05/12/2012  . Hyperlipidemia 05/12/2012  . Osteopenia   . TMJ syndrome   . Vitamin D deficiency     Past Surgical History:  Procedure Laterality Date  . COLONOSCOPY    . TUBAL LIGATION       OB History   No obstetric history on file.      Home Medications    Prior to Admission medications   Medication Sig Start Date End Date Taking? Authorizing Provider  alendronate (FOSAMAX) 70 MG tablet TAKE ONE TABLET BY MOUTH ONCE A WEEK WITH  A  FULL  GLASS  OF  WATER  ON  AN  EMPTY  STOMACH 06/02/17   Harrison Mons, PA  amLODipine (NORVASC)  10 MG tablet Take 1 tablet (10 mg total) by mouth daily. 02/15/18   Harrison Mons, PA  aspirin EC 81 MG tablet Take 81 mg by mouth daily.    [provider]  fish oil-omega-3 fatty acids 1000 MG capsule Take 1,200 mg by mouth daily.     [provider]  HYDROcodone-acetaminophen (NORCO/VICODIN) 5-325 MG tablet Take 1 tablet by mouth every 6 (six) hours as needed for moderate pain. 07/18/19   Fredia Sorrow, MD  lovastatin (MEVACOR) 40 MG tablet Take 1 tablet (40 mg total) by mouth at bedtime. 02/15/18   Harrison Mons, PA  meclizine (ANTIVERT) 25 MG tablet Take 1 tablet (25 mg total) by mouth 3 (three) times daily as needed for dizziness. 02/15/18   Harrison Mons, PA  meloxicam (MOBIC) 15 MG tablet Take 1 tablet (15 mg total) by mouth daily. 02/15/18   Harrison Mons, PA    Family History Family History  Problem Relation Age of Onset  . Diabetes Sister   . Scoliosis Sister   . Arthritis Sister   . Alcohol abuse Daughter   . Breast cancer Daughter   . Heart disease Son        dysrrhythmia  . Transient ischemic attack Mother   .  Other Father        unsure of history    Social History Social History   Tobacco Use  . Smoking status: Current Some Day Smoker    Packs/day: 0.30    Types: Cigarettes  . Smokeless tobacco: Never Used  . Tobacco comment: may smoke 1 cigarette in the evening after work  Substance Use Topics  . Alcohol use: Not Currently    Alcohol/week: 5.0 standard drinks    Types: 5 Cans of beer per week  . Drug use: No     Allergies   Patient has no known allergies.   Review of Systems Review of Systems  Constitutional: Negative for chills and fever.  HENT: Negative for rhinorrhea and sore throat.   Eyes: Negative for visual disturbance.  Respiratory: Negative for cough and shortness of breath.   Cardiovascular: Negative for chest pain and leg swelling.  Gastrointestinal: Negative for abdominal pain, diarrhea, nausea and vomiting.   Genitourinary: Negative for dysuria.  Musculoskeletal: Negative for back pain and neck pain.  Skin: Negative for rash.  Neurological: Negative for dizziness, weakness, light-headedness, numbness and headaches.  Hematological: Does not bruise/bleed easily.  Psychiatric/Behavioral: Negative for confusion.     Physical Exam Updated Vital Signs BP (!) 147/102 (BP Location: Right Arm)   Pulse 74   Temp 98.3 F (36.8 C) (Oral)   Resp 18   Ht 1.549 m (5\' 1" )   Wt 59 kg   SpO2 100%   BMI 24.56 kg/m   Physical Exam Vitals signs and nursing note reviewed.  Constitutional:      General: She is not in acute distress.    Appearance: Normal appearance. She is well-developed.  HENT:     Head: Normocephalic and atraumatic.  Eyes:     Extraocular Movements: Extraocular movements intact.     Conjunctiva/sclera: Conjunctivae normal.     Pupils: Pupils are equal, round, and reactive to light.  Neck:     Musculoskeletal: Normal range of motion and neck supple.  Cardiovascular:     Rate and Rhythm: Normal rate and regular rhythm.     Heart sounds: No murmur.  Pulmonary:     Effort: Pulmonary effort is normal. No respiratory distress.     Breath sounds: Normal breath sounds.  Abdominal:     Palpations: Abdomen is soft.     Tenderness: There is no abdominal tenderness.     Comments: Abdomen soft nontender no mass per tickly nontender left lower quadrant.  Musculoskeletal: Normal range of motion.        General: No swelling, tenderness or deformity.     Comments: Pain in the left hip with range of motion of the hip.  No groin mass or pulsatile mass.  Distally dorsalis pedis 1-2+ sensation intact good cap refill.  Same for 4 right leg.  Skin:    General: Skin is warm and dry.     Capillary Refill: Capillary refill takes less than 2 seconds.  Neurological:     General: No focal deficit present.     Mental Status: She is alert and oriented to person, place, and time.     Cranial Nerves: No  cranial nerve deficit.     Sensory: No sensory deficit.     Motor: No weakness.      ED Treatments / Results  Labs (all labs ordered are listed, but only abnormal results are displayed) Labs Reviewed  COMPREHENSIVE METABOLIC PANEL  CBC    EKG None  Radiology Dg  Femur Min 2 Views Left  Result Date: 07/18/2019 CLINICAL DATA:  Hip pain radiating into groin. EXAM: LEFT FEMUR 2 VIEWS COMPARISON:  Pelvis exam 11/09/2017, FINDINGS: There is no evidence of fracture or other focal bone lesions. Soft tissues are unremarkable. Degenerative changes noted in the left hip. IMPRESSION: Degenerative changes in the left hip. No acute abnormality. Electronically Signed   By: Zetta Bills M.D.   On: 07/18/2019 11:39   Dg Hips Bilat W Or Wo Pelvis 3-4 Views  Result Date: 07/18/2019 CLINICAL DATA:  Two months of hip pain radiating into thigh. EXAM: DG HIP (WITH OR WITHOUT PELVIS) 3-4V BILAT COMPARISON:  Femur exam of the same date., previous hip exam of 11/09/2017 FINDINGS: Marked degenerative changes with further narrowing of the joint space on the left and right since the previous study of 11/09/2017. No signs of acute fracture. IMPRESSION: Marked degenerative changes in the joint space on the left and right worse when compared to the previous study of 11/09/2017. Electronically Signed   By: Zetta Bills M.D.   On: 07/18/2019 11:35    Procedures Procedures (including critical care time)  Medications Ordered in ED Medications - No data to display   Initial Impression / Assessment and Plan / ED Course  I have reviewed the triage vital signs and the nursing notes.  Pertinent labs & imaging results that were available during my care of the patient were reviewed by me and considered in my medical decision making (see chart for details).       Patient's symptoms are probably due to left hip pain.  X-ray pelvis bilateral hips with significant degenerative changes in the hips.  Most likely  explaining the pain.  Will treat with a short course of hydrocodone have her follow-up with orthopedics.  Basic labs were done just to be sure nothing awry patient has not had lab work done in over a year.  No significant abnormalities with CBC and complete metabolic panel.  No clinical concern for intra-abdominal process.  This seems to be all related to the left hip and also she sometimes has similar pain in the right hip and that also had significant degenerative changes.  Final Clinical Impressions(s) / ED Diagnoses   Final diagnoses:  Hip pain, left    ED Discharge Orders         Ordered    HYDROcodone-acetaminophen (NORCO/VICODIN) 5-325 MG tablet  Every 6 hours PRN,   Status:  Discontinued     07/18/19 1320    HYDROcodone-acetaminophen (NORCO/VICODIN) 5-325 MG tablet  Every 6 hours PRN     07/18/19 1321           Fredia Sorrow, MD 07/18/19 1332

## 2019-07-18 NOTE — ED Notes (Signed)
Patient transported to X-ray 

## 2019-07-18 NOTE — ED Triage Notes (Signed)
Pt c/o left side groin pain and thigh pain x 1 month. Pt reports unable to get appt with pcp and pain is not getting better. Denies injury. Pt also c/o HA

## 2019-07-21 DIAGNOSIS — R519 Headache, unspecified: Secondary | ICD-10-CM | POA: Diagnosis not present

## 2019-07-21 DIAGNOSIS — R42 Dizziness and giddiness: Secondary | ICD-10-CM | POA: Diagnosis not present

## 2019-07-21 DIAGNOSIS — G3184 Mild cognitive impairment, so stated: Secondary | ICD-10-CM | POA: Diagnosis not present

## 2019-07-21 DIAGNOSIS — I10 Essential (primary) hypertension: Secondary | ICD-10-CM | POA: Diagnosis not present

## 2019-07-21 DIAGNOSIS — R1030 Lower abdominal pain, unspecified: Secondary | ICD-10-CM | POA: Diagnosis not present

## 2019-07-21 DIAGNOSIS — M160 Bilateral primary osteoarthritis of hip: Secondary | ICD-10-CM | POA: Diagnosis not present

## 2019-07-24 ENCOUNTER — Telehealth: Payer: Self-pay | Admitting: Radiology

## 2019-07-24 ENCOUNTER — Ambulatory Visit (INDEPENDENT_AMBULATORY_CARE_PROVIDER_SITE_OTHER): Payer: Medicare HMO | Admitting: Orthopedic Surgery

## 2019-07-24 ENCOUNTER — Encounter: Payer: Self-pay | Admitting: Orthopedic Surgery

## 2019-07-24 VITALS — Ht 61.0 in | Wt 130.0 lb

## 2019-07-24 DIAGNOSIS — M1612 Unilateral primary osteoarthritis, left hip: Secondary | ICD-10-CM | POA: Diagnosis not present

## 2019-07-24 NOTE — Progress Notes (Signed)
Office Visit Note   Patient: Brandi Harrington           Date of Birth: 1946-05-14           MRN: [id number] Visit Date: 07/24/2019              Requested by: Harrison Mons, [address] [place],  [place] [id number] PCP: Harrison Mons, PA   Assessment & Plan: Visit Diagnoses:  1. Primary osteoarthritis of left hip     Plan: Discussed with her treatment options which include intra-articular injection versus left total hip arthroplasty.  She would like to proceed with a left hip injection.  We will therefore have her undergo a left hip intra-articular injection with Dr. Junius Roads in the near future.  She will follow-up with [place] in a month after see what type of response she had to the injection.  Questions encouraged and answered at length.  She understands that she will have to wait at least 3 months after having the injection to have total hip arthroplasty.  Follow-Up Instructions: Return Dr.Hilts hip injection..   Orders:  No orders of the defined types were placed in this encounter.  No orders of the defined types were placed in this encounter.     Procedures: No procedures performed   Clinical Data: No additional findings.   Subjective: Chief Complaint  Patient presents with  . Left Hip - Pain, New Patient (Initial Visit)    HPI Ms. Brandi Harrington 73 year old female comes in today due to left groin pain for the past 2 months.  She had no known injury.  She is treatment outside of Norco and using icy hot for the left hip pain.  She states she did have some right hip pain but this is resolved.  She is also having some radicular-like symptoms down the left leg past the knee and some low back pain.  She has no difficulty putting on shoes or socks.  She does not use any assistive devices.  Medical history pertinent for hypertension, hepatitis C, hyperlipidemia and vertigo. Radiographs reviewed on canopy shows narrowing of both hips on the AP view with low but not  bone-on-bone arthritic changes.  No acute fractures.  Lateral view of the left hip shows degenerative changes again with narrowing of the hip but no bone-on-bone findings.  No acute findings otherwise.  Review of Systems Negative for fevers chills shortness of breath chest pain.  Objective: Vital Signs: Ht 5\' 1"  (1.549 m)   Wt 130 lb (59 kg)   BMI 24.56 kg/m   Physical Exam Constitutional:      Appearance: She is normal weight. She is not ill-appearing or diaphoretic.  Pulmonary:     Effort: Pulmonary effort is normal.  Neurological:     Mental Status: She is alert and oriented to person, place, and time.  Psychiatric:        Mood and Affect: Mood normal.     Ortho Exam Bilateral hips she has excellent range of motion of the right hip without pain.  Left hip slightly decreased internal rotation with pain.  Tenderness over left trochanteric region no tenderness over the right trochanteric region. Specialty Comments:  No specialty comments available.  Imaging: No results found.   PMFS History: Patient Active Problem List   Diagnosis Date Noted  . Memory loss 02/02/2019  . Undiagnosed cardiac murmurs 03/03/2017  . DDD (degenerative disc disease), cervical 11/17/2016  . Unilateral hearing loss, right 10/20/2016  .  Vertigo 10/20/2016  . Tinnitus, right ear 09/12/2016  . BMI 29.0-29.9,adult 06/25/2016  . Facial paresthesia 02/26/2015  . Lipoma of back 02/26/2015  . Tobacco use 01/18/2015  . Hepatitis C antibody test positive 02/15/2013  . HTN (hypertension) 05/12/2012  . Hyperlipidemia 05/12/2012  . Osteopenia   . TMJ syndrome   . Vitamin D deficiency    Past Medical History:  Diagnosis Date  . Hyperlipidemia   . Hypertension   . Left hip pain 11/09/2017  . Memory loss   . Osteopenia   . Tinnitus   . TMJ syndrome   . Undiagnosed cardiac murmurs   . Vertigo 09/14/2016  . Vitamin D deficiency     Family History  Problem Relation Age of Onset  . Diabetes  Sister   . Scoliosis Sister   . Arthritis Sister   . Alcohol abuse Daughter   . Breast cancer Daughter   . Heart disease Son        dysrrhythmia  . Transient ischemic attack Mother   . Other Father        unsure of history    Past Surgical History:  Procedure Laterality Date  . COLONOSCOPY    . TUBAL LIGATION     Social History   Occupational History  . Occupation: Retired    Fish farm manager: [employer]    Comment: [place]  Tobacco Use  . Smoking status: Current Some Day Smoker    Packs/day: 0.30    Types: Cigarettes  . Smokeless tobacco: Never Used  . Tobacco comment: may smoke 1 cigarette in the evening after work  Substance and Sexual Activity  . Alcohol use: Not Currently    Alcohol/week: 5.0 standard drinks    Types: 5 Cans of beer per week  . Drug use: No  . Sexual activity: Yes    Partners: Male    Birth control/protection: Post-menopausal

## 2019-07-24 NOTE — Telephone Encounter (Signed)
Patient was seen today by Dr. Sharol Given and Artis Delay.   Patient is to have left hip injection by Dr. Junius Roads done, then a 2 week follow up with Dr. Ninfa Linden to see how injection did.  They want to try injection before discussing surgery.  I attempted to call patient and she was having difficulty understanding the appointments, she stated a family member was with her today and wrote notes. She was unable to give me the phone number to who was with her today. I told her I would call again in the morning if she can provide a number to someone to help schedule her.

## 2019-07-25 NOTE — Telephone Encounter (Signed)
FYI  Put patient on Dr. Junius Roads schedule for Thurs. 07/27/19 at 9:40am She will have to check about transportation and will call back if needs to reschedule.

## 2019-07-27 ENCOUNTER — Ambulatory Visit (INDEPENDENT_AMBULATORY_CARE_PROVIDER_SITE_OTHER): Payer: Medicare HMO | Admitting: Family Medicine

## 2019-07-27 ENCOUNTER — Encounter: Payer: Self-pay | Admitting: Family Medicine

## 2019-07-27 ENCOUNTER — Ambulatory Visit: Payer: Self-pay

## 2019-07-27 ENCOUNTER — Other Ambulatory Visit: Payer: Self-pay

## 2019-07-27 DIAGNOSIS — M1612 Unilateral primary osteoarthritis, left hip: Secondary | ICD-10-CM | POA: Diagnosis not present

## 2019-07-27 NOTE — Progress Notes (Signed)
Subjective: Patient is here for ultrasound-guided intra-articular left hip injection.   Moderate-severe OA.  Objective:  Pain and decreased ROM with passive IR.  Procedure: Ultrasound-guided left hip injection: After sterile prep with Betadine, injected 8 cc 1% lidocaine without epinephrine and 40 mg methylprednisolone using a 22-gauge spinal needle, passing the needle through the iliofemoral ligament into the femoral head/neck junction.  Injectate seen filling joint capsule.  Excellent immediate relief.

## 2019-07-31 ENCOUNTER — Ambulatory Visit: Payer: Medicare HMO | Admitting: Family Medicine

## 2019-08-01 ENCOUNTER — Ambulatory Visit: Payer: Medicare HMO | Admitting: Orthopaedic Surgery

## 2019-08-02 NOTE — Patient Instructions (Addendum)
  MRI and labs with [place] have been normal. No identifiable causes of memory loss. I would suggest considering a referral for formal neurocognitive testing.   Please continue follow up with PCP for headaches and dizziness. May consider discussing new neck pain as this could correlate. May consider PT for dizziness if needed.   Close follow up with orthopedics for hip pain  Follow up with [place] as needed   Memory Compensation Strategies  1. Use "WARM" strategy.  W= write it down  A= associate it  R= repeat it  M= make a mental note  2.   You can keep a Social worker.  Use a 3-ring notebook with sections for the following: calendar, important names and phone numbers,  medications, doctors' names/phone numbers, lists/reminders, and a section to journal what you did  each day.   3.    Use a calendar to write appointments down.  4.    Write yourself a schedule for the day.  This can be placed on the calendar or in a separate section of the Memory Notebook.  Keeping a  regular schedule can help memory.  5.    Use medication organizer with sections for each day or morning/evening pills.  You may need help loading it  6.    Keep a basket, or pegboard by the door.  Place items that you need to take out with you in the basket or on the pegboard.  You may also want to  include a message board for reminders.  7.    Use sticky notes.  Place sticky notes with reminders in a place where the task is performed.  For example: " turn off the  stove" placed by the stove, "lock the door" placed on the door at eye level, " take your medications" on  the bathroom mirror or by the place where you normally take your medications.  8.    Use alarms/timers.  Use while cooking to remind yourself to check on food or as a reminder to take your medicine, or as a  reminder to make a call, or as a reminder to perform another task, etc.

## 2019-08-02 NOTE — Progress Notes (Signed)
PATIENT: Brandi Harrington DOB: [birth year]  REASON FOR VISIT: follow up HISTORY FROM: patient  Chief Complaint  Patient presents with  . Follow-up    Room 1, with daughter in law. States at Dr. Aggie Hacker had a Memory test with her. Still having headaches about 3-4 months off and on. May need a CT scan.     HISTORY OF PRESENT ILLNESS: Today 08/03/19 Brandi Harrington is a 73 y.o. female here today for follow up for memory loss. She presents with her daughter in law who aids in history.  Brandi Harrington feels that memory is stable.  Her daughter-in-law states that she does have trouble with short-term memory but states that information usually comes back to her.  She is living alone.  She is able to perform all ADLs.  She usually does not drive but states that she can if she needs to.  She was seen in the ER on 11/10/2018 for concerns of possible syncope.  She had initially reported drinking a few beers and smoking marijuana with her friends.  Later, she denied drinking alcohol.  She tells me today that she will drink a beer if she wants to.  She denies daily use of alcohol.  She adamantly denies smoking marijuana.  Rapid drug screen in the ER positive for [facility] in February 2020.  She denies any falls.  No syncopal events or seizure-like activity noted.  She was seen by PCP on 10/30 for headaches. She was started on topiramate 25mg  twice daily at that time. She is not sure if this is helping.  MRI and labs were unremarkable in May. She saw Dr Sharol Given orthopedics for left hip pain.  She was given a 14-day supply of Norco on 07/18/2019. She had steroid injections last week that did not help. She has noted right sided neck pain for 2 months. She has not seen PCP for neck pain.   HISTORY: (copied from Dr Rhea Belton note on 02/02/2019)  Brandi Harrington is a 73 years old female, with her sister Brandi Harrington, seen in request by her primary care physician Dr. Bernerd Limbo for evaluation of memory loss.  I have reviewed and summarized the  referring note from the referring physician.  She has past medical history of hypertension, hyperlipidemia, retired from daycare at age 57, there was no family history of memory loss.    She began to experience gradual onset of memory loss since 2018, gradually getting worse especially since 2019, she tends to misplace things, forgot conversations, there was no family history of memory loss, she denies lateralized motor or sensory deficit  She presented to the emergency room on November 10, 2018 for evaluation of near syncope, this happened after she drink beers, smoking marijuana, then she began to feel bloated, throwing up, was noted by her family members, has whole body tremors, shaking all over,   I personally reviewed CT head without contrast, there was no acute abnormality  Laboratory evaluations UDS was positive for marijuana, negative troponin, CMP showed creatinine of 1.1, CBC was normal hemoglobin of 12.7   REVIEW OF SYSTEMS: Out of a complete 14 system review of symptoms, the patient complains only of the following symptoms, neck pain, hip pain headaches and all other reviewed systems are negative.  ALLERGIES: No Known Allergies  HOME MEDICATIONS: Outpatient Medications Prior to Visit  Medication Sig Dispense Refill  . alendronate (FOSAMAX) 70 MG tablet TAKE ONE TABLET BY MOUTH ONCE A WEEK WITH  A  FULL  GLASS  OF  WATER  ON  AN  EMPTY  STOMACH 12 tablet 3  . amLODipine (NORVASC) 10 MG tablet Take 1 tablet (10 mg total) by mouth daily. 90 tablet 3  . aspirin EC 81 MG tablet Take 81 mg by mouth daily.    . fish oil-omega-3 fatty acids 1000 MG capsule Take 1,200 mg by mouth daily.     Marland Kitchen HYDROcodone-acetaminophen (NORCO/VICODIN) 5-325 MG tablet Take 1 tablet by mouth every 6 (six) hours as needed for moderate pain. 14 tablet 0  . lovastatin (MEVACOR) 40 MG tablet Take 1 tablet (40 mg total) by mouth at bedtime. 90 tablet 3  . meclizine (ANTIVERT) 25 MG tablet Take 1 tablet (25 mg  total) by mouth 3 (three) times daily as needed for dizziness. 30 tablet 5  . meloxicam (MOBIC) 15 MG tablet Take 1 tablet (15 mg total) by mouth daily. 90 tablet 1  . topiramate (TOPAMAX) 25 MG tablet Take 25 mg by mouth at bedtime.     No facility-administered medications prior to visit.     PAST MEDICAL HISTORY: Past Medical History:  Diagnosis Date  . Hyperlipidemia   . Hypertension   . Left hip pain 11/09/2017  . Memory loss   . Osteopenia   . Tinnitus   . TMJ syndrome   . Undiagnosed cardiac murmurs   . Vertigo 09/14/2016  . Vitamin D deficiency     PAST SURGICAL HISTORY: Past Surgical History:  Procedure Laterality Date  . COLONOSCOPY    . TUBAL LIGATION      FAMILY HISTORY: Family History  Problem Relation Age of Onset  . Diabetes Sister   . Scoliosis Sister   . Arthritis Sister   . Alcohol abuse Daughter   . Breast cancer Daughter   . Heart disease Son        dysrrhythmia  . Transient ischemic attack Mother   . Other Father        unsure of history    SOCIAL HISTORY: Social History   Socioeconomic History  . Marital status: Divorced    Spouse name: n/a  . Number of children: 2  . Years of education: 12th grade  . Highest education level: Not on file  Occupational History  . Occupation: Retired    Fish farm manager: [employer]    Comment: [employer]  . Financial resource strain: Not on file  . Food insecurity    Worry: Not on file    Inability: Not on file  . Transportation needs    Medical: Not on file    Non-medical: Not on file  Tobacco Use  . Smoking status: Current Some Day Smoker    Packs/day: 0.30    Types: Cigarettes  . Smokeless tobacco: Never Used  . Tobacco comment: may smoke 1 cigarette in the evening after work  Substance and Sexual Activity  . Alcohol use: Not Currently    Alcohol/week: 5.0 standard drinks    Types: 5 Cans of beer per week  . Drug use: No  . Sexual activity: Yes    Partners: Male    Birth  control/protection: Post-menopausal  Lifestyle  . Physical activity    Days per week: Not on file    Minutes per session: Not on file  . Stress: Not on file  Relationships  . Social Herbalist on phone: Not on file    Gets together: Not on file    Attends religious service: Not on file  Active member of club or organization: Not on file    Attends meetings of clubs or organizations: Not on file    Relationship status: Not on file  . Intimate partner violence    Fear of current or ex partner: Not on file    Emotionally abused: Not on file    Physically abused: Not on file    Forced sexual activity: Not on file  Other Topics Concern  . Not on file  Social History Narrative   [employer] [place] [employer] [address] bankruptcy (not her business)-couldn't get a loan modification.  Ex-husband s/p CVA x several, residual left hemiparesis, DM diagnosed at age 38, ASCVD (severe), s/p CABG . He's ill-humored and "mean." He moved into    an assisted living facility fall 2014, and they divorced in 10/2014.   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      02/02/2019:     1 cup coffee per day.   Her daughter lives with her.  Brother lives next door.  Sister lives on same street.   Right-handed.      PHYSICAL EXAM  Vitals:   08/03/19 0921  BP: 124/76  Pulse: 78  Temp: 98.7 F (37.1 C)  Weight: 142 lb 3.2 oz (64.5 kg)  Height: 5\' 1"  (1.549 m)   Body mass index is 26.87 kg/m.  Generalized: Well developed, in no acute distress  Cardiology: normal rate and rhythm, no murmur noted Respiratory: Clear to auscultation bilaterally Neurological examination  Mentation: Alert oriented to time, place, history taking. Follows all commands speech and language fluent.  Cranial nerve II-XII: Pupils were equal round reactive to light. Extraocular movements were full, visual field were full on  confrontational test. Facial sensation and strength were normal. Uvula tongue midline. Head turning and shoulder shrug  were normal and symmetric. Motor: The motor testing reveals 5 over 5 strength of all 4 extremities. Good symmetric motor tone is noted throughout.  Sensory: Sensory testing is intact to soft touch on all 4 extremities. No evidence of extinction is noted.  Coordination: Cerebellar testing reveals good finger-nose-finger and heel-to-shin bilaterally.  Gait and station: Gait is normal.   DIAGNOSTIC DATA (LABS, IMAGING, TESTING) - I reviewed patient records, labs, notes, testing and imaging myself where available.  MMSE - Mini Mental State Exam 08/03/2019 02/02/2019  Orientation to time 4 4  Orientation to Place 5 5  Registration 3 3  Attention/ Calculation 0 2  Recall 1 0  Language- name 2 objects 2 2  Language- repeat 0 1  Language- follow 3 step command 3 3  Language- read & follow direction 0 1  Write a sentence 1 0  Copy design 0 0  Copy design-comments named 7 animals -  Total score 19 21     Lab Results  Component Value Date   WBC 7.0 07/18/2019   HGB 14.1 07/18/2019   HCT 41.5 07/18/2019   MCV 97.2 07/18/2019   PLT 316 07/18/2019      Component Value Date/Time   NA 138 07/18/2019 1149   NA 141 02/15/2018 1338   K 4.4 07/18/2019 1149   CL 104 07/18/2019 1149   CO2 22 07/18/2019 1149   GLUCOSE 82 07/18/2019 1149   BUN 17 07/18/2019 1149   BUN 12 02/15/2018 1338   CREATININE 0.81 07/18/2019 1149   CREATININE 1.02 02/26/2015 1110   CALCIUM 9.5 07/18/2019  1149   PROT 8.0 07/18/2019 1149   PROT 7.1 02/15/2018 1338   ALBUMIN 4.4 07/18/2019 1149   ALBUMIN 4.5 02/15/2018 1338   AST 31 07/18/2019 1149   ALT 17 07/18/2019 1149   ALKPHOS 72 07/18/2019 1149   BILITOT 0.8 07/18/2019 1149   BILITOT <0.2 02/15/2018 1338   GFRNONAA >60 07/18/2019 1149   GFRAA >60 07/18/2019 1149   Lab Results  Component Value Date   CHOL 201 (H) 02/15/2018   HDL 88  02/15/2018   LDLCALC 99 02/15/2018   TRIG 68 02/15/2018   CHOLHDL 2.3 02/15/2018   No results found for: HGBA1C Lab Results  Component Value Date   VITAMINB12 396 02/15/2019   Lab Results  Component Value Date   TSH 1.450 02/15/2019     ASSESSMENT AND PLAN 73 y.o. year old female  has a past medical history of Hyperlipidemia, Hypertension, Left hip pain (11/09/2017), Memory loss, Osteopenia, Tinnitus, TMJ syndrome, Undiagnosed cardiac murmurs, Vertigo (09/14/2016), and Vitamin D deficiency. here with     ICD-10-CM   1. Memory loss  R41.3   2. Chronic nonintractable headache, unspecified headache type  R51.9    G89.29   3. Neck pain  M54.2     Brandi Harrington presents today with multiple concerns of chronic pain as well as new right-sided neck pain.  She does not feel that she is having any difficulty with her memory.  Her daughter-in-law states that there have been some changes with short-term memory but information typically comes back to her.  She continues to perform all ADLs and live alone without obvious difficulties.  We have discussed results of MRI and lab work performed by Dr. Krista Blue in May.  No obvious etiology for memory loss identified.  I have discussed having a formal neurocognitive evaluation with the patient and her daughter-in-law.  They declined this offer today but will speak to family members regarding this.  I have also discussed possible contributing factors with memory loss.  She adamantly denies using THC.  Her daughter-in-law will repetitively ask if she has had any " edibles."  She is unclear in answering this question.  She states that she will drink a beer if she wants to.  She is unclear on the frequency of alcohol use.  I have advised that she continue close follow-up with primary care for headaches and neck pain.  Neck pain has not been evaluated.  Neuro exam intact today.  She will also continue follow-up with orthopedics for her hip pain.  She will follow-up with [place] as  needed for worsening memory concerns.  Otherwise she may continue to follow with PCP.  She and her daughter both verbalized understanding and agreement with this plan.   No orders of the defined types were placed in this encounter.    No orders of the defined types were placed in this encounter.     I spent 15 minutes with the patient. 50% of this time was spent counseling and educating patient on plan of care and medications.    Debbora Presto, FNP-C 08/03/2019, 10:19 AM [facility] [address], [place], [place] [address] [phone]

## 2019-08-03 ENCOUNTER — Other Ambulatory Visit: Payer: Self-pay

## 2019-08-03 ENCOUNTER — Other Ambulatory Visit: Payer: Self-pay | Admitting: Orthopedic Surgery

## 2019-08-03 ENCOUNTER — Telehealth: Payer: Self-pay | Admitting: Orthopedic Surgery

## 2019-08-03 ENCOUNTER — Encounter: Payer: Self-pay | Admitting: Family Medicine

## 2019-08-03 ENCOUNTER — Ambulatory Visit: Payer: Medicare HMO | Admitting: Family Medicine

## 2019-08-03 VITALS — BP 124/76 | HR 78 | Temp 98.7°F | Ht 61.0 in | Wt 142.2 lb

## 2019-08-03 DIAGNOSIS — R519 Headache, unspecified: Secondary | ICD-10-CM | POA: Diagnosis not present

## 2019-08-03 DIAGNOSIS — G8929 Other chronic pain: Secondary | ICD-10-CM

## 2019-08-03 DIAGNOSIS — R413 Other amnesia: Secondary | ICD-10-CM

## 2019-08-03 DIAGNOSIS — M542 Cervicalgia: Secondary | ICD-10-CM

## 2019-08-03 MED ORDER — HYDROCODONE-ACETAMINOPHEN 5-325 MG PO TABS: 1.0000 | ORAL_TABLET | Freq: Four times a day (QID) | ORAL | 0 refills | Status: DC | PRN

## 2019-08-03 NOTE — Telephone Encounter (Signed)
rx sent

## 2019-08-03 NOTE — Telephone Encounter (Signed)
Osteoarthritis of left hip and s/p injection with Dr. Junius Roads. Requesting refill on Hydrocodone last filled 07/18/19 #14

## 2019-08-03 NOTE — Telephone Encounter (Signed)
Patient's daughter in law called to request an RX refill on the patient's pain medication.  [phone].  Thank you.

## 2019-08-07 NOTE — Progress Notes (Signed)
I have reviewed and agreed above plan. 

## 2019-08-10 DIAGNOSIS — K048 Radicular cyst: Secondary | ICD-10-CM | POA: Diagnosis not present

## 2019-08-14 DIAGNOSIS — R69 Illness, unspecified: Secondary | ICD-10-CM | POA: Diagnosis not present

## 2019-08-21 DIAGNOSIS — K046 Periapical abscess with sinus: Secondary | ICD-10-CM | POA: Diagnosis not present

## 2019-08-21 DIAGNOSIS — K048 Radicular cyst: Secondary | ICD-10-CM | POA: Diagnosis not present

## 2019-08-25 DIAGNOSIS — R69 Illness, unspecified: Secondary | ICD-10-CM | POA: Diagnosis not present

## 2019-08-25 DIAGNOSIS — I10 Essential (primary) hypertension: Secondary | ICD-10-CM | POA: Diagnosis not present

## 2019-08-25 DIAGNOSIS — M5030 Other cervical disc degeneration, unspecified cervical region: Secondary | ICD-10-CM | POA: Diagnosis not present

## 2019-08-25 DIAGNOSIS — G3184 Mild cognitive impairment, so stated: Secondary | ICD-10-CM | POA: Diagnosis not present

## 2019-08-25 DIAGNOSIS — E785 Hyperlipidemia, unspecified: Secondary | ICD-10-CM | POA: Diagnosis not present

## 2019-08-25 DIAGNOSIS — R519 Headache, unspecified: Secondary | ICD-10-CM | POA: Diagnosis not present

## 2019-08-28 DIAGNOSIS — M47812 Spondylosis without myelopathy or radiculopathy, cervical region: Secondary | ICD-10-CM | POA: Diagnosis not present

## 2019-09-24 IMAGING — CT CT HEAD W/O CM
4 series · 16 of 47 positions shown, 18 images · non-contrast
Comparison: None.

CLINICAL DATA: Headache

EXAM:
CT HEAD WITHOUT CONTRAST
TECHNIQUE: Contiguous axial images were obtained from the base of the skull
through the vertex without intravenous contrast.

[Series 3: head without · axial · non-contrast · 0.42mm/px · z∈[+1260,+1380]mm · 7 of 33 slices shown, 9 images]
[im 5/33  brain]
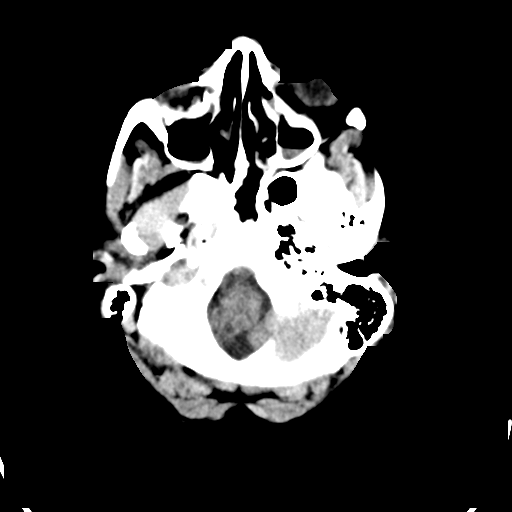
[im 5/33  bone]
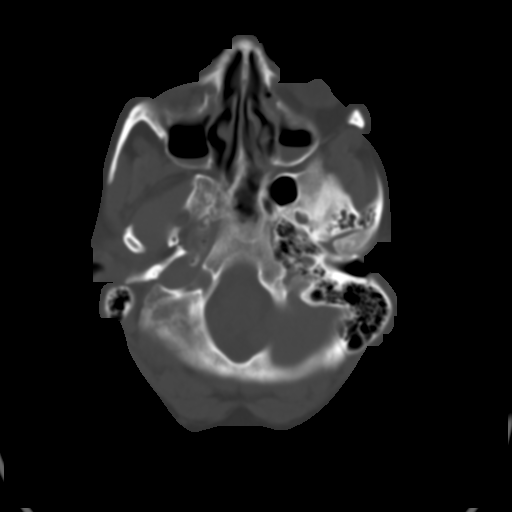
[im 9/33  brain]
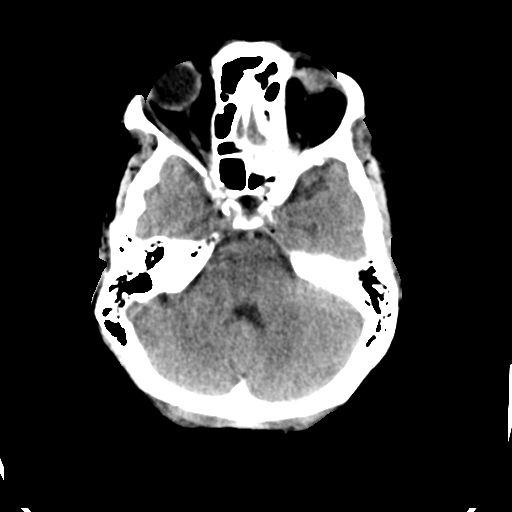
[im 13/33  brain]
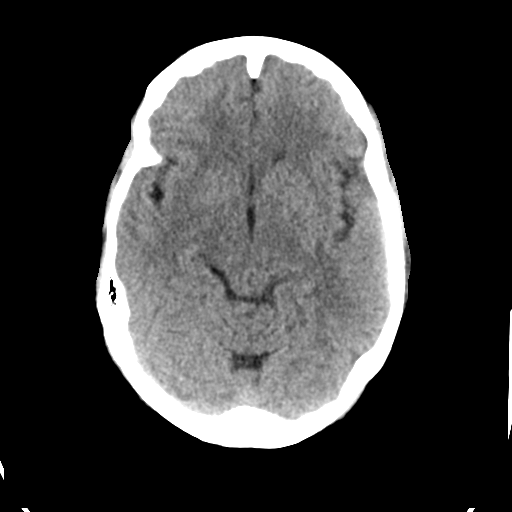
[im 17/33  brain]
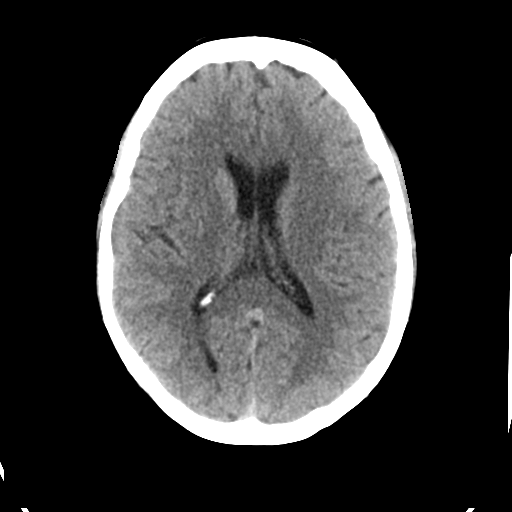
[im 21/33  brain]
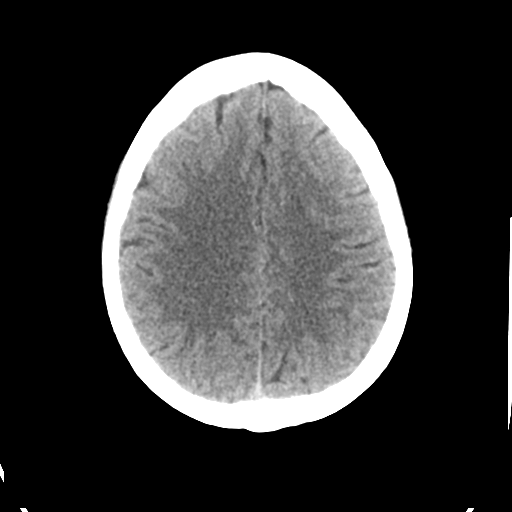
[im 21/33  bone]
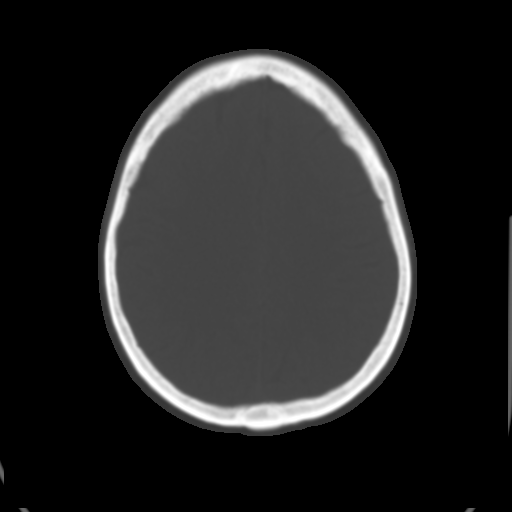
[im 25/33  brain]
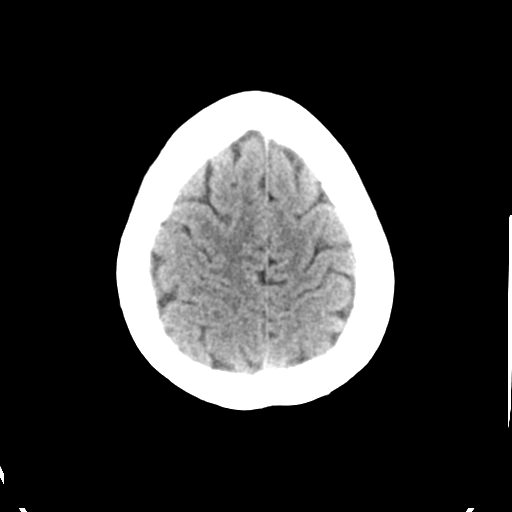
[im 29/33  brain]
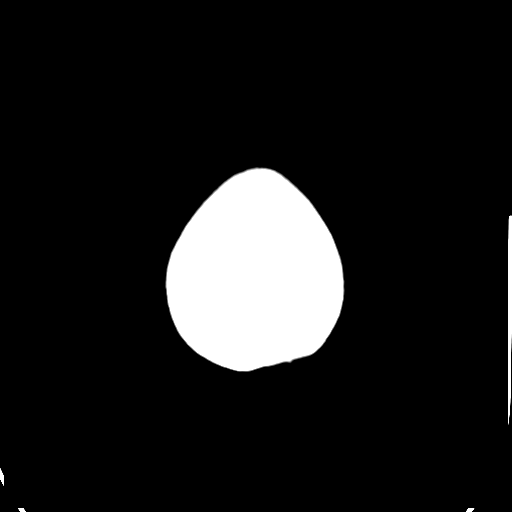

[Series 4: head bone · axial · 0.42mm/px · z∈[+1256,+1288]mm · 3 of 81 slices shown]
[im 9/81  bone]
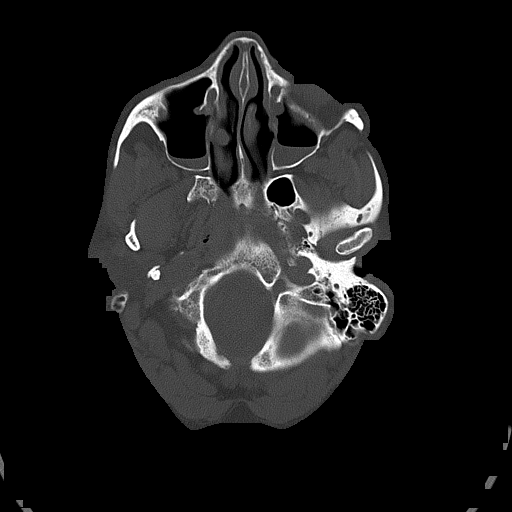
[im 17/81  bone]
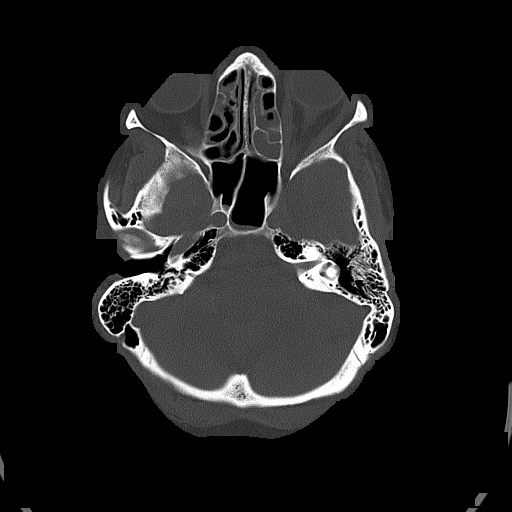
[im 25/81  bone]
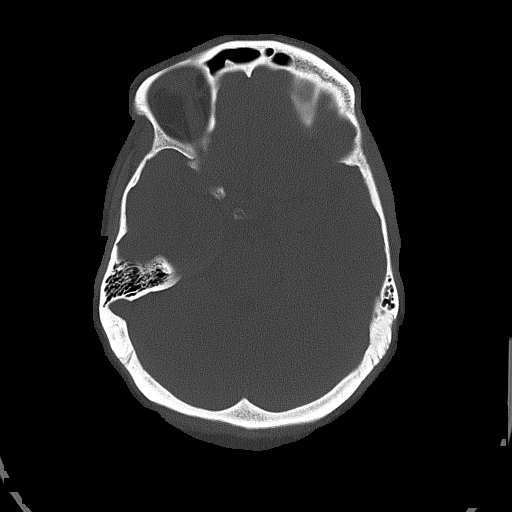

[Series 5: head without cor · coronal · non-contrast · 0.31mm/px · 3 of 73 slices shown]
[im 25/73  brain]
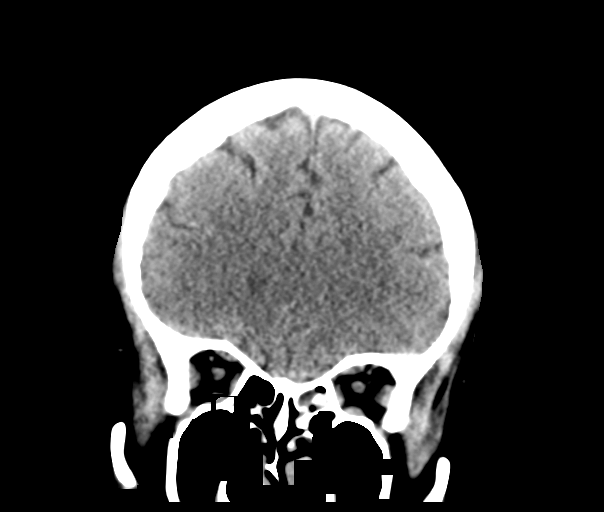
[im 33/73  brain]
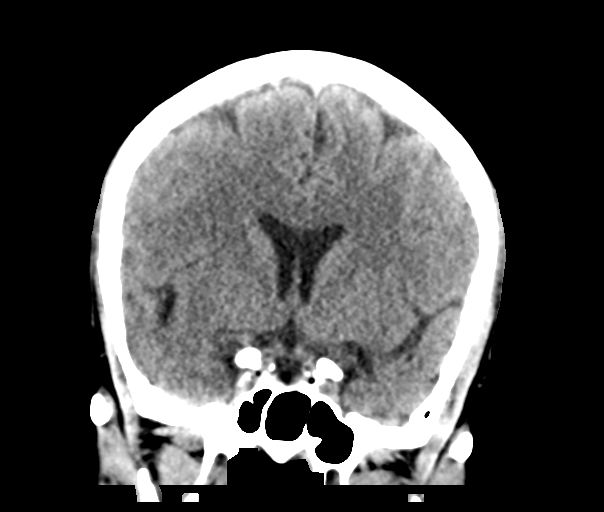
[im 41/73  brain]
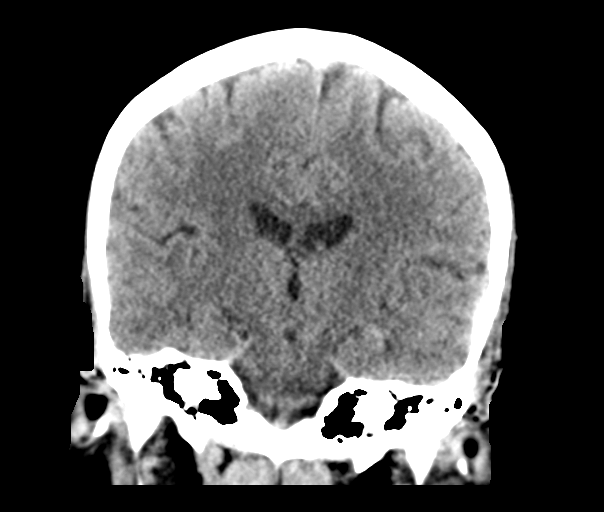

[Series 6: head without sag · sagittal · non-contrast · 0.31mm/px · 3 of 58 slices shown]
[im 20/58  brain]
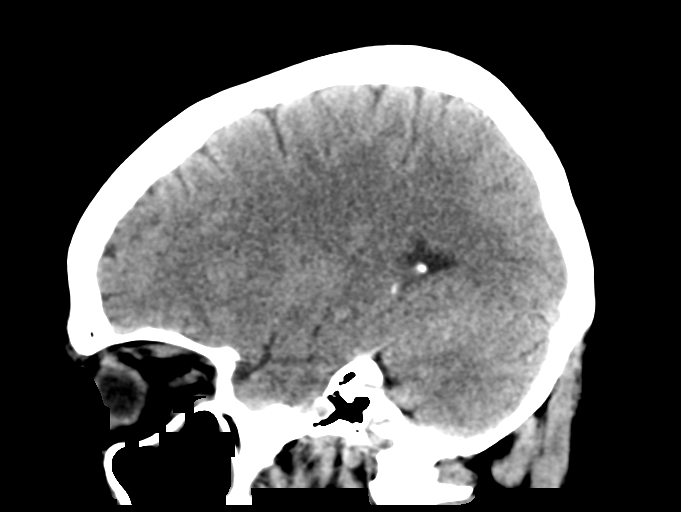
[im 29/58  brain]
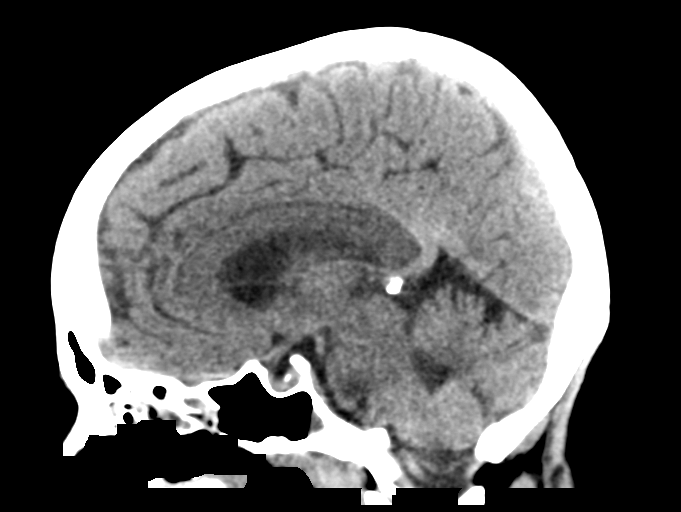
[im 39/58  brain]
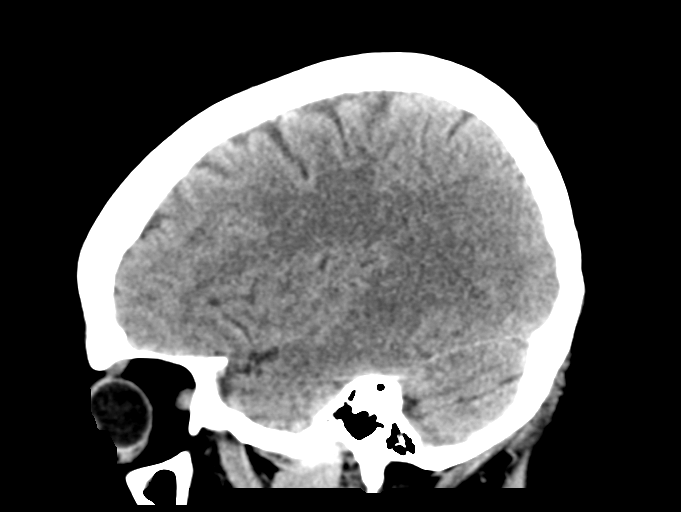

[16 of 47 positions shown; findings below may reference images not displayed]

FINDINGS: Brain: No evidence of acute infarction, hemorrhage, hydrocephalus,
extra-axial collection or mass lesion/mass effect.

Vascular: No hyperdense vessel or unexpected calcification. Carotid
vascular calcification

Skull: Normal. Negative for fracture or focal lesion.

Sinuses/Orbits: Fluid level in the sphenoid and maxillary sinuses.
Mucosal thickening in the ethmoid sinuses

Other: None
IMPRESSION: 1. Negative non contrasted CT appearance of the brain
2. Sinusitis

## 2019-12-15 ENCOUNTER — Ambulatory Visit (INDEPENDENT_AMBULATORY_CARE_PROVIDER_SITE_OTHER): Payer: Medicare Other | Admitting: Family Medicine

## 2019-12-15 ENCOUNTER — Encounter: Payer: Self-pay | Admitting: Family Medicine

## 2019-12-15 ENCOUNTER — Other Ambulatory Visit: Payer: Self-pay

## 2019-12-15 DIAGNOSIS — M1612 Unilateral primary osteoarthritis, left hip: Secondary | ICD-10-CM

## 2019-12-15 NOTE — Progress Notes (Signed)
Left hip pain for quite sometime, just worsening. Also having some groin pain. Had some xrays with Dr. Rogene Houston endof last year. No injury that she is aware of. He also gave her an injection, but she doesn't remember it helping too much. Maybe just for a short time

## 2019-12-15 NOTE — Progress Notes (Signed)
Office Visit Note   Patient: Brandi Harrington           Date of Birth: 25-Jun-1946           MRN: [id number] Visit Date: 12/15/2019 Requested by: Janie Morning, DO [place],  [place] [id number] PCP: Janie Morning, DO  Subjective: Chief Complaint  Patient presents with  . Left Hip - Pain    HPI: She is here with persistent left hip pain.  Injection in November did not help according to her power of attorney who is with her today.  Pain is constant, negative difficult for her walk.              ROS:   All other systems were reviewed and are negative.  Objective: Vital Signs: There were no vitals taken for this visit.  Physical Exam:  General:  Alert and oriented, in no acute distress. Pulm:  Breathing unlabored. Psy:  Normal mood, congruent affect.  Left hip: She has very limited passive flexion and internal rotation with substantial amount of pain.  She has pain and weakness with hip flexion against resistance.  No significant tenderness over the greater trochanter.  Imaging: None today  Assessment & Plan: 1.  Left hip osteoarthritis -Discussed options with her, she wants to consult with Dr. Erlinda Hong to discuss hip replacement.  We will arrange this for her.     Procedures: No procedures performed  No notes on file     PMFS History: Patient Active Problem List   Diagnosis Date Noted  . Memory loss 02/02/2019  . Undiagnosed cardiac murmurs 03/03/2017  . DDD (degenerative disc disease), cervical 11/17/2016  . Unilateral hearing loss, right 10/20/2016  . Vertigo 10/20/2016  . Tinnitus, right ear 09/12/2016  . BMI 29.0-29.9,adult 06/25/2016  . Facial paresthesia 02/26/2015  . Lipoma of back 02/26/2015  . Tobacco use 01/18/2015  . Hepatitis C antibody test positive 02/15/2013  . HTN (hypertension) 05/12/2012  . Hyperlipidemia 05/12/2012  . Osteopenia   . TMJ syndrome   . Vitamin D deficiency    Past Medical History:  Diagnosis Date  .  Hyperlipidemia   . Hypertension   . Left hip pain 11/09/2017  . Memory loss   . Osteopenia   . Tinnitus   . TMJ syndrome   . Undiagnosed cardiac murmurs   . Vertigo 09/14/2016  . Vitamin D deficiency     Family History  Problem Relation Age of Onset  . Diabetes Sister   . Scoliosis Sister   . Arthritis Sister   . Alcohol abuse Daughter   . Breast cancer Daughter   . Heart disease Son        dysrrhythmia  . Transient ischemic attack Mother   . Other Father        unsure of history    Past Surgical History:  Procedure Laterality Date  . COLONOSCOPY    . TUBAL LIGATION     Social History   Occupational History  . Occupation: Retired    Fish farm manager: [employer]    Comment: [employer]  Tobacco Use  . Smoking status: Current Some Day Smoker    Packs/day: 0.30    Types: Cigarettes  . Smokeless tobacco: Never Used  . Tobacco comment: may smoke 1 cigarette in the evening after work  Substance and Sexual Activity  . Alcohol use: Not Currently    Alcohol/week: 5.0 standard drinks    Types: 5 Cans of beer per week  .  Drug use: No  . Sexual activity: Yes    Partners: Male    Birth control/protection: Post-menopausal

## 2019-12-26 ENCOUNTER — Encounter: Payer: Self-pay | Admitting: Orthopaedic Surgery

## 2019-12-26 ENCOUNTER — Ambulatory Visit: Payer: Medicare Other | Admitting: Orthopaedic Surgery

## 2019-12-26 ENCOUNTER — Other Ambulatory Visit: Payer: Self-pay

## 2019-12-26 DIAGNOSIS — M1612 Unilateral primary osteoarthritis, left hip: Secondary | ICD-10-CM | POA: Diagnosis not present

## 2019-12-26 NOTE — Progress Notes (Signed)
Office Visit Note   Patient: Brandi Harrington           Date of Birth: October 11, 1945           MRN: [id number] Visit Date: 12/26/2019              Requested by: Brandi Harrington [address] [place],  [place] [address] PCP: Brandi Harrington   Assessment & Plan: Visit Diagnoses:  1. Primary osteoarthritis of left hip     Plan: My impression is end-stage left hip DJD without significant relief from conservative treatment.  Cortisone injections have not given her sufficient relief to allow her to return back to her desired functional level.  Based on discussion of treatment options she has elected to proceed with a left total hip replacement.  Risk benefits rehab and recovery were all reviewed in detail today.  We will obtain preoperative medical clearance prior to scheduling.  Questions encouraged and answered.  Follow-Up Instructions: Return if symptoms worsen or fail to improve.   Orders:  No orders of the defined types were placed in this encounter.  No orders of the defined types were placed in this encounter.     Procedures: No procedures performed   Clinical Data: No additional findings.   Subjective: Chief Complaint  Patient presents with  . Left Hip - Pain    Brandi Harrington is a 74 year old female who is a referral from Brandi Harrington for evaluation of chronic left hip pain due to end-stage osteoarthritis.  She has had a steroid injection about 5 months ago which did not give her any significant relief to wear and improved her activity and function and quality of life.  She has chronic groin and thigh pain which causes her to limp when walking.  She is not able to be as active as she wants due to the pain.  She has chronic night pain as well.  Denies any radicular symptoms.  Over-the-counter NSAIDs have not given any significant relief.   Review of Systems  Constitutional: Negative.   HENT: Negative.   Eyes: Negative.   Respiratory: Negative.   Cardiovascular:  Negative.   Endocrine: Negative.   Musculoskeletal: Negative.   Neurological: Negative.   Hematological: Negative.   Psychiatric/Behavioral: Negative.   All other systems reviewed and are negative.    Objective: Vital Signs: There were no vitals taken for this visit.  Physical Exam Vitals and nursing note reviewed.  Constitutional:      Appearance: She is well-developed.  Pulmonary:     Effort: Pulmonary effort is normal.  Skin:    General: Skin is warm.     Capillary Refill: Capillary refill takes less than 2 seconds.  Neurological:     Mental Status: She is alert and oriented to person, place, and time.  Psychiatric:        Behavior: Behavior normal.        Thought Content: Thought content normal.        Judgment: Judgment normal.     Ortho Exam Left hip shows significant pain with internal and external rotation with limitation secondary to pain.  She walks with an antalgic gait.  Positive Stinchfield sign. Specialty Comments:  No specialty comments available.  Imaging: No results found.   PMFS History: Patient Active Problem List   Diagnosis Date Noted  . Primary osteoarthritis of left hip 12/26/2019  . Memory loss 02/02/2019  . Undiagnosed cardiac murmurs 03/03/2017  . DDD (degenerative disc disease), cervical  11/17/2016  . Unilateral hearing loss, right 10/20/2016  . Vertigo 10/20/2016  . Tinnitus, right ear 09/12/2016  . BMI 29.0-29.9,adult 06/25/2016  . Facial paresthesia 02/26/2015  . Lipoma of back 02/26/2015  . Tobacco use 01/18/2015  . Hepatitis C antibody test positive 02/15/2013  . HTN (hypertension) 05/12/2012  . Hyperlipidemia 05/12/2012  . Osteopenia   . TMJ syndrome   . Vitamin D deficiency    Past Medical History:  Diagnosis Date  . Hyperlipidemia   . Hypertension   . Left hip pain 11/09/2017  . Memory loss   . Osteopenia   . Tinnitus   . TMJ syndrome   . Undiagnosed cardiac murmurs   . Vertigo 09/14/2016  . Vitamin D  deficiency     Family History  Problem Relation Age of Onset  . Diabetes Sister   . Scoliosis Sister   . Arthritis Sister   . Alcohol abuse Daughter   . Breast cancer Daughter   . Heart disease Son        dysrrhythmia  . Transient ischemic attack Mother   . Other Father        unsure of history    Past Surgical History:  Procedure Laterality Date  . COLONOSCOPY    . TUBAL LIGATION     Social History   Occupational History  . Occupation: Retired    Fish farm manager: [employer]    Comment: [employer]  Tobacco Use  . Smoking status: Current Some Day Smoker    Packs/day: 0.30    Types: Cigarettes  . Smokeless tobacco: Never Used  . Tobacco comment: may smoke 1 cigarette in the evening after work  Substance and Sexual Activity  . Alcohol use: Not Currently    Alcohol/week: 5.0 standard drinks    Types: 5 Cans of beer per week  . Drug use: No  . Sexual activity: Yes    Partners: Male    Birth control/protection: Post-menopausal

## 2020-01-02 ENCOUNTER — Telehealth: Payer: Self-pay | Admitting: Orthopaedic Surgery

## 2020-01-02 NOTE — Telephone Encounter (Signed)
Patient's granddaughter called stating that the PCP needed to be updated before they could proceed in scheduling her hip replacement surgery.  PCP has been updated.  Please send any notes in regards to the surgery to the new PCP.  Thank you.

## 2020-01-08 ENCOUNTER — Telehealth: Payer: Self-pay

## 2020-01-08 NOTE — Telephone Encounter (Signed)
NOTES ON FILE FROM [facility] [phone], SENT REFERRAL TO SCHEDULING 

## 2020-01-22 ENCOUNTER — Other Ambulatory Visit: Payer: Self-pay

## 2020-02-05 NOTE — Progress Notes (Signed)
Your procedure is scheduled on Monday May 24.  Report to [facility] Main Entrance "A" at 07:45 A.M., and check in at the Admitting office.  Call this number if you have problems the morning of surgery: [phone]  Call [phone] if you have any questions prior to your surgery date Monday-Friday 8am-4pm   Remember: Do not eat or drink after midnight the night before your surgery  You may drink clear liquids until ___ the morning of your surgery.   Clear liquids allowed are: Water, Non-Citrus Juices (without pulp), Carbonated Beverages, Clear Tea, Black Coffee Only, and Gatorade   Take these medicines the morning of surgery with A SIP OF WATER: amLODipine (NORVASC)  Follow your surgeon's instructions on when to stop Aspirin.  If no instructions were given by your surgeon then you will need to call the office to get those instructions.     7 days prior to surgery STOP taking any meloxicam (MOBIC), Aspirin containing products, Aleve, Naproxen, Ibuprofen, Motrin, Advil, Goody's, BC's, all herbal medications, fish oil, and all vitamins.    The Morning of Surgery  Do not wear jewelry, make-up or nail polish.  Do not wear lotions, powders, or perfumes, or deodorant  Do not shave 48 hours prior to surgery.    Do not bring valuables to the hospital.  [facility] is not responsible for any belongings or valuables.  If you are a smoker, DO NOT Smoke 24 hours prior to surgery  If you wear a CPAP at night please bring your mask the morning of surgery   Remember that you must have someone to transport you home after your surgery, and remain with you for 24 hours if you are discharged the same day.   Please bring cases for contacts, glasses, hearing aids, dentures or bridgework because it cannot be worn into surgery.    Leave your suitcase in the car.  After surgery it may be brought to your room.  For patients admitted to the hospital, discharge time will be determined by your  treatment team.  Patients discharged the day of surgery will not be allowed to drive home.    Special instructions:   [place]- Preparing For Surgery  Before surgery, you can play an important role. Because skin is not sterile, your skin needs to be as free of germs as possible. You can reduce the number of germs on your skin by washing with CHG (chlorahexidine gluconate) Soap before surgery.  CHG is an antiseptic cleaner which kills germs and bonds with the skin to continue killing germs even after washing.    Oral Hygiene is also important to reduce your risk of infection.  Remember - BRUSH YOUR TEETH THE MORNING OF SURGERY WITH YOUR REGULAR TOOTHPASTE  Please do not use if you have an allergy to CHG or antibacterial soaps. If your skin becomes reddened/irritated stop using the CHG.  Do not shave (including legs and underarms) for at least 48 hours prior to first CHG shower. It is OK to shave your face.  Please follow these instructions carefully.   1. Shower the NIGHT BEFORE SURGERY and the MORNING OF SURGERY with CHG Soap.   2. If you chose to wash your hair and body, wash as usual with your normal shampoo and body-wash/soap.  3. Rinse your hair and body thoroughly to remove the shampoo and soap.  4. Apply CHG directly to the skin (ONLY FROM THE NECK DOWN) and wash gently with a scrungie or a clean  washcloth.   5. Do not use on open wounds or open sores. Avoid contact with your eyes, ears, mouth and genitals (private parts). Wash Face and genitals (private parts)  with your normal soap.   6. Wash thoroughly, paying special attention to the area where your surgery will be performed.  7. Thoroughly rinse your body with warm water from the neck down.  8. DO NOT shower/wash with your normal soap after using and rinsing off the CHG Soap.  9. Pat yourself dry with a CLEAN TOWEL.  10. Wear CLEAN PAJAMAS to bed the night before surgery  11. Place CLEAN SHEETS on your bed the night  of your first shower and DO NOT SLEEP WITH PETS.  12. Wear comfortable clothes the morning of surgery.     Day of Surgery:  Please shower the morning of surgery with the CHG soap Do not apply any deodorants/lotions. Please wear clean clothes to the hospital/surgery center.   Remember to brush your teeth WITH YOUR REGULAR TOOTHPASTE.   Please read over the following fact sheets that you were given.

## 2020-02-06 ENCOUNTER — Other Ambulatory Visit: Payer: Self-pay | Admitting: Family

## 2020-02-06 ENCOUNTER — Telehealth: Payer: Self-pay | Admitting: Orthopaedic Surgery

## 2020-02-06 ENCOUNTER — Encounter (HOSPITAL_COMMUNITY)
Admission: RE | Admit: 2020-02-06 | Discharge: 2020-02-06 | Disposition: A | Discharge status: Home / Self Care | Payer: Medicare Other | Source: Ambulatory Visit | Attending: Orthopaedic Surgery | Admitting: Orthopaedic Surgery

## 2020-02-06 ENCOUNTER — Other Ambulatory Visit: Payer: Self-pay

## 2020-02-06 ENCOUNTER — Encounter (HOSPITAL_COMMUNITY): Payer: Self-pay

## 2020-02-06 DIAGNOSIS — M8580 Other specified disorders of bone density and structure, unspecified site: Secondary | ICD-10-CM | POA: Insufficient documentation

## 2020-02-06 DIAGNOSIS — M1612 Unilateral primary osteoarthritis, left hip: Secondary | ICD-10-CM | POA: Diagnosis not present

## 2020-02-06 DIAGNOSIS — F17200 Nicotine dependence, unspecified, uncomplicated: Secondary | ICD-10-CM | POA: Diagnosis not present

## 2020-02-06 DIAGNOSIS — I10 Essential (primary) hypertension: Secondary | ICD-10-CM | POA: Diagnosis not present

## 2020-02-06 DIAGNOSIS — E785 Hyperlipidemia, unspecified: Secondary | ICD-10-CM | POA: Insufficient documentation

## 2020-02-06 DIAGNOSIS — Z7982 Long term (current) use of aspirin: Secondary | ICD-10-CM | POA: Insufficient documentation

## 2020-02-06 DIAGNOSIS — Z79899 Other long term (current) drug therapy: Secondary | ICD-10-CM | POA: Insufficient documentation

## 2020-02-06 DIAGNOSIS — G3184 Mild cognitive impairment, so stated: Secondary | ICD-10-CM | POA: Insufficient documentation

## 2020-02-06 DIAGNOSIS — Z01818 Encounter for other preprocedural examination: Secondary | ICD-10-CM | POA: Diagnosis not present

## 2020-02-06 DIAGNOSIS — Z7983 Long term (current) use of bisphosphonates: Secondary | ICD-10-CM | POA: Diagnosis not present

## 2020-02-06 HISTORY — DX: Osteoarthritis of hip, unspecified: M16.9

## 2020-02-06 LAB — COMPREHENSIVE METABOLIC PANEL
ALT: 23 U/L (ref 0–44)
AST: 29 U/L (ref 15–41)
Albumin: 4.3 g/dL (ref 3.5–5.0)
Alkaline Phosphatase: 64 U/L (ref 38–126)
Anion gap: 11 (ref 5–15)
BUN: 18 mg/dL (ref 8–23)
CO2: 23 mmol/L (ref 22–32)
Calcium: 9.7 mg/dL (ref 8.9–10.3)
Chloride: 111 mmol/L (ref 98–111)
Creatinine, Ser: 1.12 mg/dL — ABNORMAL HIGH (ref 0.44–1.00)
GFR calc Af Amer: 57 mL/min — ABNORMAL LOW (ref >60)
GFR calc non Af Amer: 49 mL/min — ABNORMAL LOW (ref >60)
Glucose, Bld: 97 mg/dL (ref 70–99)
Potassium: 3.8 mmol/L (ref 3.5–5.1)
Sodium: 145 mmol/L (ref 135–145)
Total Bilirubin: 0.4 mg/dL (ref 0.3–1.2)
Total Protein: 7.6 g/dL (ref 6.5–8.1)

## 2020-02-06 LAB — URINALYSIS, ROUTINE W REFLEX MICROSCOPIC
Bilirubin Urine: NEGATIVE
Glucose, UA: NEGATIVE mg/dL
Hgb urine dipstick: NEGATIVE
Ketones, ur: NEGATIVE mg/dL
Leukocytes,Ua: NEGATIVE
Nitrite: NEGATIVE
Protein, ur: NEGATIVE mg/dL
Specific Gravity, Urine: 1.014 (ref 1.005–1.030)
pH: 5 (ref 5.0–8.0)

## 2020-02-06 LAB — CBC
HCT: 40.2 % (ref 36.0–46.0)
Hemoglobin: 13.3 g/dL (ref 12.0–15.0)
MCH: 32.6 pg (ref 26.0–34.0)
MCHC: 33.1 g/dL (ref 30.0–36.0)
MCV: 98.5 fL (ref 80.0–100.0)
Platelets: 336 10*3/uL (ref 150–400)
RBC: 4.08 MIL/uL (ref 3.87–5.11)
RDW: 13.1 % (ref 11.5–15.5)
WBC: 9 10*3/uL (ref 4.0–10.5)
nRBC: 0 % (ref 0.0–0.2)

## 2020-02-06 LAB — PROTIME-INR
INR: 0.9 (ref 0.8–1.2)
Prothrombin Time: 12.2 seconds (ref 11.4–15.2)

## 2020-02-06 LAB — SURGICAL PCR SCREEN
MRSA, PCR: NEGATIVE
Staphylococcus aureus: NEGATIVE

## 2020-02-06 LAB — APTT: aPTT: 28 seconds (ref 24–36)

## 2020-02-06 NOTE — Progress Notes (Signed)
Anesthesia Chart Review:  Case: [id number] Date/Time: 02/12/20 0931   Procedure: LEFT TOTAL HIP ARTHROPLASTY ANTERIOR APPROACH (Left Hip) - RNFA   Anesthesia type: Spinal   Pre-op diagnosis: left hip osteoarthritis   Location: [facility] OR ROOM 05 / [place] OR   Surgeons: Leandrew Koyanagi, MD      DISCUSSION: Patient is a 74 year old female scheduled for the above procedure.  History includes smoking, HTN, HLD, TMJ, vertigo (2017), murmur (trival AR, mild MR, moderate TR 02/2018), memory loss/mild cognitive impairment, tinnitus.  Of note, she had a positive hepatitis C antibody test in 02/09/13 and was referred to [place]. According to 09/19/13 correspondence from Florentina Addison, [place] with [facility] (scanned under Media tab), "Upon further HCV-RVA testing, there is no active virus detected. She either had a false positive antibody test or it is possible that she was infected with hepatitis C at some point and then had a spontaneous clearance of the virus.  At this point, I do not think that she needs any further follow-up with hepatology at this time."  PCP Harrison Mons, PA signed note of medical clearance for surgery.  Patient denied shortness of breath, cough, fever, chest pain at PAT RN visit.  Per Dr. Erlinda Hong, stop ASA as of 02/06/20.  Presurgical COVID-19 test is scheduled for 02/08/2020.  Anesthesia team to evaluate on the day of surgery.   VS: BP (!) 150/102   Pulse 67   Temp 36.9 C (Oral)   Resp 18   Wt 69.4 kg   SpO2 100%   BMI 28.93 kg/m  It does not appear that BP was rechecked PAT; however, historically BP has been controlled. Last reading at primary care office [facility]): 130/80 on 01/02/20, 126/78 on 12/01/19, and 124/84 on 08/25/19.  She is to take amlodipine on the day of surgery and will get vitals on arrival.   PROVIDERS: Harrison Mons, PA is PCP ([place]). Last visit 01/02/20 for headaches and preoperative evaluation.  - Marcial Pacas, MD is neurologist. Seen  by Debbora Presto, NP on 08/03/19 for follow-up memory difficulties. No obvious etiology identified of imaging (Brain MRI) and lab work. She declined formal neurocognitive evaluation. She discussed contributing factors (THC, alcohol). As needed neurology follow-up recommended.  - She is not followed routinely by cardiology, but was evaluated on 02/23/18 by Ermalinda Barrios, PA-C/Smith, Mallie Mussel, MD for murmur and HTN. Echo done (see below). Advised she could follow-up in 1 year if desired, otherwise as needed with on-going primary care follow-up.   LABS: Labs reviewed: Acceptable for surgery. (all labs ordered are listed, but only abnormal results are displayed)  Labs Reviewed  COMPREHENSIVE METABOLIC PANEL - Abnormal; Notable for the following components:      Result Value   Creatinine, Ser 1.12 (*)    GFR calc non Af Amer 49 (*)    GFR calc Af Amer 57 (*)    All other components within normal limits  SURGICAL PCR SCREEN  APTT  CBC  PROTIME-INR  URINALYSIS, ROUTINE W REFLEX MICROSCOPIC     EKG: 02/06/20:  Sinus bradycardia at 59 bpm Incomplete right bundle branch block Possible anterior infarct, age undetermined Abnormal ECG No significant change since 11/10/2018 Confirmed by Jenkins Rouge on 02/06/2020 2:50:55 PM   CV: Echo 03/02/18: Study Conclusions  - Left ventricle: The cavity size was normal. There was moderate  concentric hypertrophy. Systolic function was normal. The  estimated ejection fraction was in the range of 60% to 65%. Wall  motion was normal; there were no regional wall motion  abnormalities. Doppler parameters are consistent with abnormal  left ventricular relaxation (grade 1 diastolic dysfunction).  There was no evidence of elevated ventricular filling pressure by  Doppler parameters.  - Aortic valve: There was trivial regurgitation.  - Aortic root: The aortic root was normal in size.  - Mitral valve: There was mild regurgitation.  - Left atrium: The  atrium was normal in size.  - Right ventricle: Systolic function was normal.  - Right atrium: The atrium was normal in size.  - Tricuspid valve: There was moderate regurgitation.  - Pulmonary arteries: Systolic pressure was mildly increased. PA  peak pressure: 32 mm Hg (S).  - Inferior vena cava: The vessel was normal in size. The  respirophasic diameter changes were in the normal range (= 50%),  consistent with normal central venous pressure.  - Pericardium, extracardiac: There was no pericardial effusion.    Carotid US 07/10/16: Impression: Bilateral internal carotid artery velocities suggest less than 40% stenosis.   Nuclear stress test 02/19/11: IMPRESSION: 1.  No evidence of inducible myocardial ischemia, or myocardial scar. 2.  Normal left ventricular wall motion study, with calculated ejection fraction of 58%.   Past Medical History:  Diagnosis Date  . Hyperlipidemia   . Hypertension   . Left hip pain 11/09/2017  . Memory loss   . Osteoarthritis of hip   . Osteopenia   . Tinnitus   . TMJ syndrome   . Undiagnosed cardiac murmurs   . Vertigo 09/14/2016  . Vitamin D deficiency     Past Surgical History:  Procedure Laterality Date  . COLONOSCOPY    . TUBAL LIGATION      MEDICATIONS: . alendronate (FOSAMAX) 70 MG tablet  . amLODipine (NORVASC) 10 MG tablet  . aspirin EC 81 MG tablet  . HYDROcodone-acetaminophen (NORCO/VICODIN) 5-325 MG tablet  . lovastatin (MEVACOR) 40 MG tablet  . meclizine (ANTIVERT) 25 MG tablet  . meloxicam (MOBIC) 15 MG tablet  . topiramate (TOPAMAX) 25 MG tablet   No current facility-administered medications for this encounter.  Patient not currently taking Norco or Antivert (prescribed as needed for dizziness/vertigo).   Myra Gianotti, PA-C Surgical Short Stay/Anesthesiology [facility] Phone [phone] [facility] Phone [phone] 02/06/2020 5:05 PM

## 2020-02-06 NOTE — Telephone Encounter (Signed)
Patient's sister called  The patient is scheduled for surgery on Monday so they need to know when she is to stop taking Asprin and if she will be staying overnight   Call back: [phone]

## 2020-02-06 NOTE — Telephone Encounter (Signed)
She can stop aspirin now and yes she will stay o/n

## 2020-02-06 NOTE — Progress Notes (Signed)
PCP - Harrison Mons, PA Cardiologist - denies  PPM/ICD - denies  Chest x-ray - N/A EKG - 02/06/2020 Stress Test - denies ECHO - 03/02/18 Cardiac Cath - denies  Sleep Study - denies CPAP - N/A  DM: denies  Blood Thinner Instructions: N/A Aspirin Instructions: Patient instructed to call surgeon's office today for instrcutions on when to stop.  ERAS Protcol - Yes PRE-SURGERY Ensure or G2- Ensure given  COVID TEST- Scheduled for 02/08/2020. Patient and patient's sister verbalized understanding of self-quarantine instructions, appointment time and place.  Anesthesia review: YES, abnormal EKG   Patient denies shortness of breath, fever, cough and chest pain at PAT appointment  All instructions explained to the patient, with a verbal understanding of the material. Patient agrees to go over the instructions while at home for a better understanding. Patient also instructed to self quarantine after being tested for COVID-19. The opportunity to ask questions was provided.

## 2020-02-06 NOTE — Anesthesia Preprocedure Evaluation (Addendum)
Anesthesia Evaluation  Patient identified by MRN, date of birth, ID band Patient awake    Reviewed: Allergy & Precautions, NPO status , Patient's Chart, lab work & pertinent test results  Airway Mallampati: II  TM Distance: >3 FB Neck ROM: Full    Dental  (+) Teeth Intact, Partial Lower, Partial Upper   Pulmonary Current Smoker and Patient abstained from smoking.,    Pulmonary exam normal breath sounds clear to auscultation       Cardiovascular hypertension, Pt. on medications Normal cardiovascular exam+ Valvular Problems/Murmurs  Rhythm:Regular Rate:Normal     Neuro/Psych negative neurological ROS  negative psych ROS   GI/Hepatic negative GI ROS, Neg liver ROS, (+)     (-) substance abuse  ,   Endo/Other  Hyperlipidemia  Renal/GU negative Renal ROS  negative genitourinary   Musculoskeletal  (+) Arthritis , Osteoarthritis,  Osteoporosis   Abdominal   Peds  Hematology negative hematology ROS (+)   Anesthesia Other Findings   Reproductive/Obstetrics                           Anesthesia Physical Anesthesia Plan  ASA: III  Anesthesia Plan: Spinal   Post-op Pain Management:    Induction:   PONV Risk Score and Plan: 2 and Propofol infusion and Treatment may vary due to age or medical condition  Airway Management Planned: Natural Airway, Nasal Cannula and Simple Face Mask  Additional Equipment:   Intra-op Plan:   Post-operative Plan:   Informed Consent: I have reviewed the patients History and Physical, chart, labs and discussed the procedure including the risks, benefits and alternatives for the proposed anesthesia with the patient or authorized representative who has indicated his/her understanding and acceptance.     Dental advisory given  Plan Discussed with: CRNA and Surgeon  Anesthesia Plan Comments: (PAT note written by Myra Gianotti, PA-C. )       Anesthesia  Quick Evaluation

## 2020-02-06 NOTE — Progress Notes (Signed)
Your procedure is scheduled on Monday May 24.  Report to [facility] Main Entrance "A" at 07:45 A.M., and check in at the Admitting office.  Call this number if you have problems the morning of surgery: [phone]  Call [phone] if you have any questions prior to your surgery date Monday-Friday 8am-4pm  Remember: Do not eat after midnight the night before your surgery  You may drink clear liquids until 6:45 A.M. the morning of your surgery.   Clear liquids allowed are: Water, Non-Citrus Juices (without pulp), Carbonated Beverages, Clear Tea, Black Coffee Only, and Gatorade  Enhanced Recovery after Surgery for Orthopedics Enhanced Recovery after Surgery is a protocol used to improve the stress on your body and your recovery after surgery.  Patient Instructions  . The night before surgery:  o No food after midnight. ONLY clear liquids after midnight  .  Marland Kitchen The day of surgery (if you do NOT have diabetes):  o Drink ONE (1) Pre-Surgery Clear Ensure by 6:45 A.M. the morning of surgery o This drink was given to you during your hospital  pre-op appointment visit. o Nothing else to drink after completing the  Pre-Surgery Clear Ensure   Take these medicines the morning of surgery with A SIP OF WATER: amLODipine (NORVASC)  Follow your surgeon's instructions on when to stop Aspirin.  If no instructions were given by your surgeon then you will need to call the office to get those instructions.    As of today, STOP taking any meloxicam (MOBIC), Aspirin containing products, Aleve, Naproxen, Ibuprofen, Motrin, Advil, Goody's, BC's, all herbal medications, fish oil, and all vitamins.   The Morning of Surgery  Do not wear jewelry, make-up or nail polish.  Do not wear lotions, powders, or perfumes, or deodorant  Do not shave 48 hours prior to surgery.    Do not bring valuables to the hospital.  [facility] is not responsible for any belongings or valuables.  If you are a smoker, DO NOT  Smoke 24 hours prior to surgery  If you wear a CPAP at night please bring your mask the morning of surgery   Remember that you must have someone to transport you home after your surgery, and remain with you for 24 hours if you are discharged the same day.   Please bring cases for contacts, glasses, hearing aids, dentures or bridgework because it cannot be worn into surgery.    Leave your suitcase in the car.  After surgery it may be brought to your room.  For patients admitted to the hospital, discharge time will be determined by your treatment team.  Patients discharged the day of surgery will not be allowed to drive home.    Special instructions:   [place]- Preparing For Surgery  Before surgery, you can play an important role. Because skin is not sterile, your skin needs to be as free of germs as possible. You can reduce the number of germs on your skin by washing with CHG (chlorahexidine gluconate) Soap before surgery.  CHG is an antiseptic cleaner which kills germs and bonds with the skin to continue killing germs even after washing.    Oral Hygiene is also important to reduce your risk of infection.  Remember - BRUSH YOUR TEETH THE MORNING OF SURGERY WITH YOUR REGULAR TOOTHPASTE  Please do not use if you have an allergy to CHG or antibacterial soaps. If your skin becomes reddened/irritated stop using the CHG.  Do not shave (including legs and underarms) for  at least 48 hours prior to first CHG shower. It is OK to shave your face.  Please follow these instructions carefully.   1. Shower the NIGHT BEFORE SURGERY and the MORNING OF SURGERY with CHG Soap.   2. If you chose to wash your hair and body, wash as usual with your normal shampoo and body-wash/soap.  3. Rinse your hair and body thoroughly to remove the shampoo and soap.  4. Apply CHG directly to the skin (ONLY FROM THE NECK DOWN) and wash gently with a scrungie or a clean washcloth.   5. Do not use on open wounds or  open sores. Avoid contact with your eyes, ears, mouth and genitals (private parts). Wash Face and genitals (private parts)  with your normal soap.   6. Wash thoroughly, paying special attention to the area where your surgery will be performed.  7. Thoroughly rinse your body with warm water from the neck down.  8. DO NOT shower/wash with your normal soap after using and rinsing off the CHG Soap.  9. Pat yourself dry with a CLEAN TOWEL.  10. Wear CLEAN PAJAMAS to bed the night before surgery  11. Place CLEAN SHEETS on your bed the night of your first shower and DO NOT SLEEP WITH PETS.  12. Wear comfortable clothes the morning of surgery.   Day of Surgery: Please shower the morning of surgery with the CHG soap Do not apply any deodorants/lotions. Please wear clean clothes to the hospital/surgery center.   Remember to brush your teeth WITH YOUR REGULAR TOOTHPASTE.  Please read over the following fact sheets that you were given.

## 2020-02-07 NOTE — Telephone Encounter (Signed)
Called no answer LMOM.

## 2020-02-08 ENCOUNTER — Other Ambulatory Visit (HOSPITAL_COMMUNITY)
Admission: RE | Admit: 2020-02-08 | Discharge: 2020-02-08 | Disposition: A | Discharge status: Home / Self Care | Payer: Medicare Other | Source: Ambulatory Visit | Attending: Orthopaedic Surgery | Admitting: Orthopaedic Surgery

## 2020-02-08 DIAGNOSIS — Z01812 Encounter for preprocedural laboratory examination: Secondary | ICD-10-CM | POA: Insufficient documentation

## 2020-02-08 DIAGNOSIS — Z20822 Contact with and (suspected) exposure to covid-19: Secondary | ICD-10-CM | POA: Insufficient documentation

## 2020-02-09 LAB — SARS CORONAVIRUS 2 (TAT 6-24 HRS): SARS Coronavirus 2: NEGATIVE

## 2020-02-09 MED ORDER — TRANEXAMIC ACID 1000 MG/10ML IV SOLN
2000.0000 mg | INTRAVENOUS | Status: DC
  Filled 2020-02-09 (×2): qty 20

## 2020-02-09 MED ORDER — BUPIVACAINE LIPOSOME 1.3 % IJ SUSP
20.0000 mL | Freq: Once | INTRAMUSCULAR | Status: DC
  Filled 2020-02-09: qty 20

## 2020-02-11 ENCOUNTER — Other Ambulatory Visit: Payer: Self-pay | Admitting: Orthopaedic Surgery

## 2020-02-11 MED ORDER — ONDANSETRON HCL 4 MG PO TABS: 4.0000 mg | ORAL_TABLET | Freq: Three times a day (TID) | ORAL | 0 refills | Status: DC | PRN

## 2020-02-11 MED ORDER — ASPIRIN EC 81 MG PO TBEC: 81.0000 mg | DELAYED_RELEASE_TABLET | Freq: Two times a day (BID) | ORAL | 0 refills | Status: AC

## 2020-02-11 MED ORDER — METHOCARBAMOL 500 MG PO TABS: 500.0000 mg | ORAL_TABLET | Freq: Four times a day (QID) | ORAL | 2 refills | Status: AC | PRN

## 2020-02-11 MED ORDER — OXYCODONE-ACETAMINOPHEN 5-325 MG PO TABS: 1.0000 | ORAL_TABLET | Freq: Three times a day (TID) | ORAL | 0 refills | Status: DC | PRN

## 2020-02-11 NOTE — Discharge Instructions (Signed)

## 2020-02-12 ENCOUNTER — Ambulatory Visit (HOSPITAL_COMMUNITY): Payer: Medicare Other | Admitting: Anesthesiology

## 2020-02-12 ENCOUNTER — Encounter (HOSPITAL_COMMUNITY)
Admission: RE | Disposition: A | Discharge status: Home / Self Care | Payer: Self-pay | Source: Home / Self Care | Attending: Orthopaedic Surgery

## 2020-02-12 ENCOUNTER — Observation Stay (HOSPITAL_COMMUNITY): Payer: Medicare Other

## 2020-02-12 ENCOUNTER — Ambulatory Visit (HOSPITAL_COMMUNITY): Payer: Medicare Other | Admitting: Vascular Surgery

## 2020-02-12 ENCOUNTER — Other Ambulatory Visit: Payer: Self-pay

## 2020-02-12 ENCOUNTER — Encounter (HOSPITAL_COMMUNITY): Payer: Self-pay | Admitting: Orthopaedic Surgery

## 2020-02-12 ENCOUNTER — Ambulatory Visit (HOSPITAL_COMMUNITY): Payer: Medicare Other

## 2020-02-12 ENCOUNTER — Observation Stay (HOSPITAL_COMMUNITY)
Admission: RE | Admit: 2020-02-12 | Discharge: 2020-02-14 | Disposition: A | Discharge status: Home / Self Care | Payer: Medicare Other | Attending: Orthopaedic Surgery | Admitting: Orthopaedic Surgery

## 2020-02-12 DIAGNOSIS — Z96649 Presence of unspecified artificial hip joint: Secondary | ICD-10-CM

## 2020-02-12 DIAGNOSIS — I10 Essential (primary) hypertension: Secondary | ICD-10-CM | POA: Diagnosis not present

## 2020-02-12 DIAGNOSIS — Z7982 Long term (current) use of aspirin: Secondary | ICD-10-CM | POA: Diagnosis not present

## 2020-02-12 DIAGNOSIS — Z79899 Other long term (current) drug therapy: Secondary | ICD-10-CM | POA: Insufficient documentation

## 2020-02-12 DIAGNOSIS — D62 Acute posthemorrhagic anemia: Secondary | ICD-10-CM | POA: Diagnosis not present

## 2020-02-12 DIAGNOSIS — F17210 Nicotine dependence, cigarettes, uncomplicated: Secondary | ICD-10-CM | POA: Diagnosis not present

## 2020-02-12 DIAGNOSIS — E785 Hyperlipidemia, unspecified: Secondary | ICD-10-CM | POA: Insufficient documentation

## 2020-02-12 DIAGNOSIS — M1612 Unilateral primary osteoarthritis, left hip: Secondary | ICD-10-CM | POA: Diagnosis not present

## 2020-02-12 DIAGNOSIS — Z96642 Presence of left artificial hip joint: Secondary | ICD-10-CM | POA: Diagnosis present

## 2020-02-12 DIAGNOSIS — Z419 Encounter for procedure for purposes other than remedying health state, unspecified: Secondary | ICD-10-CM

## 2020-02-12 HISTORY — PX: TOTAL HIP ARTHROPLASTY: SHX124

## 2020-02-12 SURGERY — ARTHROPLASTY, HIP, TOTAL, ANTERIOR APPROACH
Anesthesia: Spinal | Site: Hip | Laterality: Left

## 2020-02-12 MED ORDER — METHOCARBAMOL 1000 MG/10ML IJ SOLN
500.0000 mg | Freq: Four times a day (QID) | INTRAVENOUS | Status: DC | PRN
  Filled 2020-02-12: qty 5

## 2020-02-12 MED ORDER — DIPHENHYDRAMINE HCL 12.5 MG/5ML PO ELIX: 25.0000 mg | ORAL_SOLUTION | ORAL | Status: DC | PRN

## 2020-02-12 MED ORDER — SODIUM CHLORIDE (PF) 0.9 % IJ SOLN
INTRAMUSCULAR | Status: DC | PRN
  Administered 2020-02-12: 20 mL

## 2020-02-12 MED ORDER — KETOROLAC TROMETHAMINE 15 MG/ML IJ SOLN
INTRAMUSCULAR | Status: AC
  Administered 2020-02-12: 15 mg
  Filled 2020-02-12: qty 1

## 2020-02-12 MED ORDER — FENTANYL CITRATE (PF) 100 MCG/2ML IJ SOLN
INTRAMUSCULAR | Status: DC | PRN
  Administered 2020-02-12: 50 ug via INTRAVENOUS

## 2020-02-12 MED ORDER — POVIDONE-IODINE 10 % EX SWAB: 2.0000 "application " | Freq: Once | CUTANEOUS | Status: DC

## 2020-02-12 MED ORDER — FENTANYL CITRATE (PF) 100 MCG/2ML IJ SOLN
INTRAMUSCULAR | Status: AC
  Filled 2020-02-12: qty 2

## 2020-02-12 MED ORDER — METOCLOPRAMIDE HCL 5 MG/ML IJ SOLN: 5.0000 mg | Freq: Three times a day (TID) | INTRAMUSCULAR | Status: DC | PRN

## 2020-02-12 MED ORDER — ASPIRIN 81 MG PO CHEW
81.0000 mg | CHEWABLE_TABLET | Freq: Two times a day (BID) | ORAL | Status: DC
  Administered 2020-02-12 – 2020-02-14 (×4): 81 mg via ORAL
  Filled 2020-02-12 (×4): qty 1

## 2020-02-12 MED ORDER — VANCOMYCIN HCL 1 G IV SOLR
INTRAVENOUS | Status: DC | PRN
  Administered 2020-02-12: 1000 mg via TOPICAL

## 2020-02-12 MED ORDER — PROPOFOL 10 MG/ML IV BOLUS
INTRAVENOUS | Status: DC | PRN
  Administered 2020-02-12: 20 mg via INTRAVENOUS

## 2020-02-12 MED ORDER — PHENYLEPHRINE 40 MCG/ML (10ML) SYRINGE FOR IV PUSH (FOR BLOOD PRESSURE SUPPORT)
PREFILLED_SYRINGE | INTRAVENOUS | Status: AC
  Filled 2020-02-12: qty 10

## 2020-02-12 MED ORDER — SODIUM CHLORIDE 0.9 % IV SOLN: INTRAVENOUS | Status: DC | PRN

## 2020-02-12 MED ORDER — SODIUM CHLORIDE 0.9% FLUSH
INTRAVENOUS | Status: DC | PRN
  Administered 2020-02-12: 20 mL

## 2020-02-12 MED ORDER — TRANEXAMIC ACID 1000 MG/10ML IV SOLN
INTRAVENOUS | Status: DC | PRN
  Administered 2020-02-12: 2000 mg via TOPICAL

## 2020-02-12 MED ORDER — CEFAZOLIN SODIUM-DEXTROSE 2-4 GM/100ML-% IV SOLN
2.0000 g | Freq: Four times a day (QID) | INTRAVENOUS | Status: AC
  Administered 2020-02-12 – 2020-02-13 (×3): 2 g via INTRAVENOUS
  Filled 2020-02-12 (×3): qty 100

## 2020-02-12 MED ORDER — MAGNESIUM CITRATE PO SOLN: 1.0000 | Freq: Once | ORAL | Status: DC | PRN

## 2020-02-12 MED ORDER — CHLORHEXIDINE GLUCONATE 0.12 % MT SOLN
15.0000 mL | Freq: Once | OROMUCOSAL | Status: DC
  Filled 2020-02-12: qty 15

## 2020-02-12 MED ORDER — OXYCODONE HCL 5 MG PO TABS: 10.0000 mg | ORAL_TABLET | ORAL | Status: DC | PRN

## 2020-02-12 MED ORDER — DOCUSATE SODIUM 100 MG PO CAPS
100.0000 mg | ORAL_CAPSULE | Freq: Two times a day (BID) | ORAL | Status: DC
  Administered 2020-02-12 – 2020-02-14 (×4): 100 mg via ORAL
  Filled 2020-02-12 (×4): qty 1

## 2020-02-12 MED ORDER — ONDANSETRON HCL 4 MG/2ML IJ SOLN
INTRAMUSCULAR | Status: DC | PRN
  Administered 2020-02-12: 4 mg via INTRAVENOUS

## 2020-02-12 MED ORDER — OXYCODONE HCL ER 10 MG PO T12A
10.0000 mg | EXTENDED_RELEASE_TABLET | Freq: Two times a day (BID) | ORAL | Status: DC
  Administered 2020-02-12 – 2020-02-14 (×4): 10 mg via ORAL
  Filled 2020-02-12 (×4): qty 1

## 2020-02-12 MED ORDER — PROPOFOL 10 MG/ML IV BOLUS
INTRAVENOUS | Status: AC
  Filled 2020-02-12: qty 20

## 2020-02-12 MED ORDER — GABAPENTIN 300 MG PO CAPS
300.0000 mg | ORAL_CAPSULE | Freq: Three times a day (TID) | ORAL | Status: DC
  Administered 2020-02-12 – 2020-02-14 (×6): 300 mg via ORAL
  Filled 2020-02-12 (×6): qty 1

## 2020-02-12 MED ORDER — LACTATED RINGERS IV SOLN: INTRAVENOUS | Status: DC | PRN

## 2020-02-12 MED ORDER — OXYCODONE HCL 5 MG PO TABS
ORAL_TABLET | ORAL | Status: AC
  Filled 2020-02-12: qty 1

## 2020-02-12 MED ORDER — ACETAMINOPHEN 325 MG PO TABS
325.0000 mg | ORAL_TABLET | Freq: Four times a day (QID) | ORAL | Status: DC | PRN
  Administered 2020-02-14: 650 mg via ORAL
  Filled 2020-02-12: qty 2

## 2020-02-12 MED ORDER — KETOROLAC TROMETHAMINE 15 MG/ML IJ SOLN
15.0000 mg | Freq: Four times a day (QID) | INTRAMUSCULAR | Status: AC
  Administered 2020-02-12 – 2020-02-13 (×4): 15 mg via INTRAVENOUS
  Filled 2020-02-12 (×3): qty 1

## 2020-02-12 MED ORDER — DEXAMETHASONE SODIUM PHOSPHATE 10 MG/ML IJ SOLN
10.0000 mg | Freq: Once | INTRAMUSCULAR | Status: AC
  Administered 2020-02-13: 10 mg via INTRAVENOUS
  Filled 2020-02-12: qty 1

## 2020-02-12 MED ORDER — BUPIVACAINE HCL 0.25 % IJ SOLN
INTRAMUSCULAR | Status: DC | PRN
  Administered 2020-02-12: 20 mL

## 2020-02-12 MED ORDER — BUPIVACAINE HCL (PF) 0.25 % IJ SOLN
INTRAMUSCULAR | Status: AC
  Filled 2020-02-12: qty 30

## 2020-02-12 MED ORDER — ONDANSETRON HCL 4 MG/2ML IJ SOLN
INTRAMUSCULAR | Status: AC
  Filled 2020-02-12: qty 2

## 2020-02-12 MED ORDER — CHLORHEXIDINE GLUCONATE CLOTH 2 % EX PADS: 6.0000 | MEDICATED_PAD | Freq: Every day | CUTANEOUS | Status: DC

## 2020-02-12 MED ORDER — METHOCARBAMOL 500 MG PO TABS
ORAL_TABLET | ORAL | Status: AC
  Filled 2020-02-12: qty 1

## 2020-02-12 MED ORDER — PROPOFOL 500 MG/50ML IV EMUL
INTRAVENOUS | Status: DC | PRN
  Administered 2020-02-12: 75 ug/kg/min via INTRAVENOUS

## 2020-02-12 MED ORDER — HYDROMORPHONE HCL 1 MG/ML IJ SOLN
0.5000 mg | INTRAMUSCULAR | Status: DC | PRN
  Administered 2020-02-12: 1 mg via INTRAVENOUS
  Filled 2020-02-12: qty 1

## 2020-02-12 MED ORDER — ALUM & MAG HYDROXIDE-SIMETH 200-200-20 MG/5ML PO SUSP: 30.0000 mL | ORAL | Status: DC | PRN

## 2020-02-12 MED ORDER — PHENYLEPHRINE HCL (PRESSORS) 10 MG/ML IV SOLN
INTRAVENOUS | Status: DC | PRN
Start: 2020-02-12 — End: 2020-02-12
  Administered 2020-02-12: 80 ug via INTRAVENOUS

## 2020-02-12 MED ORDER — TRANEXAMIC ACID-NACL 1000-0.7 MG/100ML-% IV SOLN
1000.0000 mg | Freq: Once | INTRAVENOUS | Status: AC
  Administered 2020-02-12: 1000 mg via INTRAVENOUS
  Filled 2020-02-12: qty 100

## 2020-02-12 MED ORDER — ACETAMINOPHEN 500 MG PO TABS
1000.0000 mg | ORAL_TABLET | Freq: Four times a day (QID) | ORAL | Status: AC
  Administered 2020-02-12 – 2020-02-13 (×3): 1000 mg via ORAL
  Filled 2020-02-12 (×4): qty 2

## 2020-02-12 MED ORDER — ONDANSETRON HCL 4 MG/2ML IJ SOLN
4.0000 mg | Freq: Four times a day (QID) | INTRAMUSCULAR | Status: DC | PRN
  Administered 2020-02-12: 4 mg via INTRAVENOUS
  Filled 2020-02-12: qty 2

## 2020-02-12 MED ORDER — CEFAZOLIN SODIUM-DEXTROSE 2-4 GM/100ML-% IV SOLN
2.0000 g | INTRAVENOUS | Status: AC
  Administered 2020-02-12: 2 g via INTRAVENOUS
  Filled 2020-02-12: qty 100

## 2020-02-12 MED ORDER — OXYCODONE HCL 5 MG PO TABS
5.0000 mg | ORAL_TABLET | ORAL | Status: DC | PRN
  Administered 2020-02-12: 5 mg via ORAL

## 2020-02-12 MED ORDER — PHENYLEPHRINE HCL-NACL 10-0.9 MG/250ML-% IV SOLN
INTRAVENOUS | Status: DC | PRN
Start: 2020-02-12 — End: 2020-02-12
  Administered 2020-02-12: 50 ug/min via INTRAVENOUS

## 2020-02-12 MED ORDER — AMLODIPINE BESYLATE 10 MG PO TABS
10.0000 mg | ORAL_TABLET | Freq: Every day | ORAL | Status: DC
  Administered 2020-02-13 – 2020-02-14 (×2): 10 mg via ORAL
  Filled 2020-02-12 (×2): qty 1

## 2020-02-12 MED ORDER — PHENOL 1.4 % MT LIQD: 1.0000 | OROMUCOSAL | Status: DC | PRN

## 2020-02-12 MED ORDER — POLYETHYLENE GLYCOL 3350 17 G PO PACK: 17.0000 g | PACK | Freq: Every day | ORAL | Status: DC | PRN

## 2020-02-12 MED ORDER — SORBITOL 70 % SOLN: 30.0000 mL | Freq: Every day | Status: DC | PRN

## 2020-02-12 MED ORDER — ONDANSETRON HCL 4 MG/2ML IJ SOLN
INTRAMUSCULAR | Status: AC
  Administered 2020-02-12: 4 mg
  Filled 2020-02-12: qty 2

## 2020-02-12 MED ORDER — 0.9 % SODIUM CHLORIDE (POUR BTL) OPTIME
TOPICAL | Status: DC | PRN
  Administered 2020-02-12: 1000 mL

## 2020-02-12 MED ORDER — FENTANYL CITRATE (PF) 250 MCG/5ML IJ SOLN
INTRAMUSCULAR | Status: AC
  Filled 2020-02-12: qty 5

## 2020-02-12 MED ORDER — ORAL CARE MOUTH RINSE: 15.0000 mL | Freq: Once | OROMUCOSAL | Status: DC

## 2020-02-12 MED ORDER — SODIUM CHLORIDE 0.9 % IR SOLN
Status: DC | PRN
  Administered 2020-02-12: 3000 mL

## 2020-02-12 MED ORDER — TOPIRAMATE 25 MG PO TABS
25.0000 mg | ORAL_TABLET | Freq: Every day | ORAL | Status: DC
  Administered 2020-02-12 – 2020-02-13 (×2): 25 mg via ORAL
  Filled 2020-02-12 (×2): qty 1

## 2020-02-12 MED ORDER — METOCLOPRAMIDE HCL 5 MG PO TABS: 5.0000 mg | ORAL_TABLET | Freq: Three times a day (TID) | ORAL | Status: DC | PRN

## 2020-02-12 MED ORDER — FENTANYL CITRATE (PF) 100 MCG/2ML IJ SOLN
25.0000 ug | INTRAMUSCULAR | Status: DC | PRN
  Administered 2020-02-12 (×3): 25 ug via INTRAVENOUS

## 2020-02-12 MED ORDER — MENTHOL 3 MG MT LOZG: 1.0000 | LOZENGE | OROMUCOSAL | Status: DC | PRN

## 2020-02-12 MED ORDER — METHOCARBAMOL 750 MG PO TABS
750.0000 mg | ORAL_TABLET | Freq: Four times a day (QID) | ORAL | Status: DC | PRN
  Administered 2020-02-12: 750 mg via ORAL
  Filled 2020-02-12: qty 1

## 2020-02-12 MED ORDER — IRRISEPT - 450ML BOTTLE WITH 0.05% CHG IN STERILE WATER, USP 99.95% OPTIME
TOPICAL | Status: DC | PRN
  Administered 2020-02-12: 450 mL

## 2020-02-12 MED ORDER — BUPIVACAINE IN DEXTROSE 0.75-8.25 % IT SOLN
INTRATHECAL | Status: DC | PRN
  Administered 2020-02-12: 1.6 mL via INTRATHECAL

## 2020-02-12 MED ORDER — VANCOMYCIN HCL 1000 MG IV SOLR
INTRAVENOUS | Status: AC
  Filled 2020-02-12: qty 1000

## 2020-02-12 MED ORDER — TRANEXAMIC ACID-NACL 1000-0.7 MG/100ML-% IV SOLN
1000.0000 mg | INTRAVENOUS | Status: AC
  Administered 2020-02-12: 1000 mg via INTRAVENOUS
  Filled 2020-02-12: qty 100

## 2020-02-12 MED ORDER — SODIUM CHLORIDE 0.9 % IV SOLN: INTRAVENOUS | Status: DC

## 2020-02-12 MED ORDER — ONDANSETRON HCL 4 MG PO TABS: 4.0000 mg | ORAL_TABLET | Freq: Four times a day (QID) | ORAL | Status: DC | PRN

## 2020-02-12 SURGICAL SUPPLY — 63 items
BAG DECANTER FOR FLEXI CONT (MISCELLANEOUS) ×2 IMPLANT
CELLS DAT CNTRL 66122 CELL SVR (MISCELLANEOUS) IMPLANT
COVER PERINEAL POST (MISCELLANEOUS) ×2 IMPLANT
COVER SURGICAL LIGHT HANDLE (MISCELLANEOUS) ×2 IMPLANT
CUP SECTOR GRIPTON 50MM (Cup) ×1 IMPLANT
DERMABOND ADVANCED (GAUZE/BANDAGES/DRESSINGS) ×1
DERMABOND ADVANCED .7 DNX12 (GAUZE/BANDAGES/DRESSINGS) IMPLANT
DRAPE C-ARM 42X72 X-RAY (DRAPES) ×2 IMPLANT
DRAPE POUCH INSTRU U-SHP 10X18 (DRAPES) ×2 IMPLANT
DRAPE STERI IOBAN 125X83 (DRAPES) ×2 IMPLANT
DRAPE U-SHAPE 47X51 STRL (DRAPES) ×4 IMPLANT
DRSG AQUACEL AG ADV 3.5X10 (GAUZE/BANDAGES/DRESSINGS) ×2 IMPLANT
DURAPREP 26ML APPLICATOR (WOUND CARE) ×4 IMPLANT
ELECT BLADE 4.0 EZ CLEAN MEGAD (MISCELLANEOUS) ×2
ELECT REM PT RETURN 9FT ADLT (ELECTROSURGICAL) ×2
ELECTRODE BLDE 4.0 EZ CLN MEGD (MISCELLANEOUS) ×1 IMPLANT
ELECTRODE REM PT RTRN 9FT ADLT (ELECTROSURGICAL) ×1 IMPLANT
GLOVE BIOGEL PI IND STRL 7.0 (GLOVE) ×1 IMPLANT
GLOVE BIOGEL PI INDICATOR 7.0 (GLOVE) ×1
GLOVE ECLIPSE 7.0 STRL STRAW (GLOVE) ×4 IMPLANT
GLOVE SKINSENSE NS SZ7.5 (GLOVE) ×1
GLOVE SKINSENSE STRL SZ7.5 (GLOVE) ×1 IMPLANT
GLOVE SURG SS PI 6.5 STRL IVOR (GLOVE) ×2 IMPLANT
GLOVE SURG SYN 7.5  E (GLOVE) ×8
GLOVE SURG SYN 7.5 E (GLOVE) ×4 IMPLANT
GLOVE SURG SYN 7.5 PF PI (GLOVE) ×4 IMPLANT
GOWN STRL REIN XL XLG (GOWN DISPOSABLE) ×2 IMPLANT
GOWN STRL REUS W/ TWL LRG LVL3 (GOWN DISPOSABLE) IMPLANT
GOWN STRL REUS W/ TWL XL LVL3 (GOWN DISPOSABLE) ×1 IMPLANT
GOWN STRL REUS W/TWL LRG LVL3 (GOWN DISPOSABLE)
GOWN STRL REUS W/TWL XL LVL3 (GOWN DISPOSABLE) ×2
HANDPIECE INTERPULSE COAX TIP (DISPOSABLE) ×2
HEAD CERAMIC 36 PLUS 8.5 12 14 (Hips) ×1 IMPLANT
HOOD PEEL AWAY FLYTE STAYCOOL (MISCELLANEOUS) ×4 IMPLANT
IV NS IRRIG 3000ML ARTHROMATIC (IV SOLUTION) ×2 IMPLANT
KIT BASIN OR (CUSTOM PROCEDURE TRAY) ×2 IMPLANT
LINER NEUTRAL 52X36MM PLUS 4 (Liner) ×1 IMPLANT
MARKER SKIN DUAL TIP RULER LAB (MISCELLANEOUS) ×2 IMPLANT
NDL SPNL 18GX3.5 QUINCKE PK (NEEDLE) ×1 IMPLANT
NEEDLE SPNL 18GX3.5 QUINCKE PK (NEEDLE) ×2 IMPLANT
PACK TOTAL JOINT (CUSTOM PROCEDURE TRAY) ×2 IMPLANT
PACK UNIVERSAL I (CUSTOM PROCEDURE TRAY) ×2 IMPLANT
PIN SECTOR W/GRIP ACE CUP 52MM (Hips) ×1 IMPLANT
RETRACTOR WND ALEXIS 18 MED (MISCELLANEOUS) IMPLANT
RTRCTR WOUND ALEXIS 18CM MED (MISCELLANEOUS)
SAW OSC TIP CART 19.5X105X1.3 (SAW) ×2 IMPLANT
SCREW 6.5MMX25MM (Screw) ×2 IMPLANT
SET HNDPC FAN SPRY TIP SCT (DISPOSABLE) ×1 IMPLANT
STEM FEM SZ3 STD ACTIS (Stem) ×1 IMPLANT
SUT ETHIBOND 2 V 37 (SUTURE) ×2 IMPLANT
SUT ETHILON 2 0 PSLX (SUTURE) ×1 IMPLANT
SUT VIC AB 0 CT1 27 (SUTURE) ×2
SUT VIC AB 0 CT1 27XBRD ANBCTR (SUTURE) ×1 IMPLANT
SUT VIC AB 1 CTX 36 (SUTURE) ×2
SUT VIC AB 1 CTX36XBRD ANBCTR (SUTURE) ×1 IMPLANT
SUT VIC AB 2-0 CT1 27 (SUTURE) ×4
SUT VIC AB 2-0 CT1 TAPERPNT 27 (SUTURE) ×2 IMPLANT
SYR 50ML LL SCALE MARK (SYRINGE) ×2 IMPLANT
TOWEL GREEN STERILE (TOWEL DISPOSABLE) ×2 IMPLANT
TRAY FOLEY W/BAG SLVR 14FR (SET/KITS/TRAYS/PACK) ×1 IMPLANT
TRAY FOLEY W/BAG SLVR 16FR (SET/KITS/TRAYS/PACK) ×2
TRAY FOLEY W/BAG SLVR 16FR ST (SET/KITS/TRAYS/PACK) ×1 IMPLANT
YANKAUER SUCT BULB TIP NO VENT (SUCTIONS) ×2 IMPLANT

## 2020-02-12 NOTE — Anesthesia Postprocedure Evaluation (Signed)
Anesthesia Post Note  Patient: YUNIQUE COULSON  Procedure(s) Performed: LEFT TOTAL HIP ARTHROPLASTY ANTERIOR APPROACH (Left Hip)     Patient location during evaluation: PACU Anesthesia Type: Spinal Level of consciousness: oriented and awake and alert Pain management: pain level controlled Vital Signs Assessment: post-procedure vital signs reviewed and stable Respiratory status: spontaneous breathing, respiratory function stable and nonlabored ventilation Cardiovascular status: blood pressure returned to baseline and stable Postop Assessment: no headache, no backache, no apparent nausea or vomiting, spinal receding and patient able to bend at knees Anesthetic complications: no    Last Vitals:  Vitals:   02/12/20 1339 02/12/20 1356  BP: 120/78 92/71  Pulse: 63 99  Resp: 20 14  Temp: 36.6 C 36.7 C  SpO2: 100% 98%    Last Pain:  Vitals:   02/12/20 1330  TempSrc:   PainSc: Asleep                 Trevaun Rendleman A.

## 2020-02-12 NOTE — Transfer of Care (Signed)
Immediate Anesthesia Transfer of Care Note  Patient: Brandi Harrington  Procedure(s) Performed: LEFT TOTAL HIP ARTHROPLASTY ANTERIOR APPROACH (Left Hip)  Patient Location: PACU  Anesthesia Type:MAC and Spinal  Level of Consciousness: awake and alert   Airway & Oxygen Therapy: Patient Spontanous Breathing  Post-op Assessment: Report given to RN and Post -op Vital signs reviewed and stable  Post vital signs: Reviewed and stable  Last Vitals:  Vitals Value Taken Time  BP 103/84 02/12/20 1224  Temp    Pulse 84 02/12/20 1226  Resp 29 02/12/20 1226  SpO2 99 % 02/12/20 1226  Vitals shown include unvalidated device data.  Last Pain:  Vitals:   02/12/20 0828  TempSrc:   PainSc: 0-No pain      Patients Stated Pain Goal: 3 (65/53/74 8270)  Complications: No apparent anesthesia complications

## 2020-02-12 NOTE — H&P (Signed)
PREOPERATIVE H&P  Chief Complaint: left hip osteoarthritis  HPI: Brandi Harrington is a 74 y.o. female who presents for surgical treatment of left hip osteoarthritis.  She denies any changes in medical history.  Past Medical History:  Diagnosis Date  . Hyperlipidemia   . Hypertension   . Left hip pain 11/09/2017  . Memory loss   . Osteoarthritis of hip   . Osteopenia   . Tinnitus   . TMJ syndrome   . Undiagnosed cardiac murmurs   . Vertigo 09/14/2016  . Vitamin D deficiency    Past Surgical History:  Procedure Laterality Date  . COLONOSCOPY    . TUBAL LIGATION     Social History   Socioeconomic History  . Marital status: Divorced    Spouse name: n/a  . Number of children: 2  . Years of education: 12th grade  . Highest education level: Not on file  Occupational History  . Occupation: Retired    Fish farm manager: [employer]    Comment: [employer]  Tobacco Use  . Smoking status: Current Some Day Smoker    Packs/day: 0.30    Types: Cigarettes  . Smokeless tobacco: Never Used  . Tobacco comment: may smoke 1 cigarette in the evening after work  Substance and Sexual Activity  . Alcohol use: Not Currently    Alcohol/week: 5.0 standard drinks    Types: 5 Cans of beer per week    Comment: 02/06/2020: patient stated drinks 2-4 beers daily  . Drug use: No  . Sexual activity: Yes    Partners: Male    Birth control/protection: Post-menopausal  Other Topics Concern  . Not on file  Social History Narrative   House on the [address] bankruptcy (not her business)-couldn't get a loan modification.  Ex-husband s/p CVA x several, residual left hemiparesis, DM diagnosed at age 24, ASCVD (severe), s/p CABG . He's ill-humored and "mean." He moved into    an assisted living facility fall 2014, and they divorced in 10/2014.   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      02/02/2019:     1 cup coffee per day.   Her daughter lives with her.  Brother lives next door.  Sister lives on same street.   Right-handed.   Social Determinants of Health   Financial Resource Strain:   . Difficulty of Paying Living Expenses:   Food Insecurity:   . Worried About Charity fundraiser in the Last Year:   . Arboriculturist in the Last Year:   Transportation Needs:   . Film/video editor (Medical):   Marland Kitchen Lack of Transportation (Non-Medical):   Physical Activity:   . Days of Exercise per Week:   . Minutes of Exercise per Session:   Stress:   . Feeling of Stress :   Social Connections:   . Frequency of Communication with Friends and Family:   . Frequency of Social Gatherings with Friends and Family:   . Attends Religious Services:   . Active Member of Clubs or Organizations:   . Attends Archivist Meetings:   Marland Kitchen Marital Status:    Family History  Problem Relation Age of Onset  . Diabetes Sister   . Scoliosis Sister   . Arthritis Sister   . Alcohol abuse Daughter   . Breast cancer Daughter   . Heart disease Son  dysrrhythmia  . Transient ischemic attack Mother   . Other Father        unsure of history   No Known Allergies Prior to Admission medications   Medication Sig Start Date End Date Taking? Authorizing Provider  alendronate (FOSAMAX) 70 MG tablet TAKE ONE TABLET BY MOUTH ONCE A WEEK WITH  A  FULL  GLASS  OF  WATER  ON  AN  EMPTY  STOMACH Patient taking differently: Take 70 mg by mouth once a week.  06/02/17  Yes Jeffery, Chelle, PA  amLODipine (NORVASC) 10 MG tablet Take 1 tablet (10 mg total) by mouth daily. 02/15/18  Yes Harrison Mons, PA  aspirin EC 81 MG tablet Take 81 mg by mouth daily.   Yes [provider]  lovastatin (MEVACOR) 40 MG tablet Take 1 tablet (40 mg total) by mouth at bedtime. 02/15/18  Yes Jeffery, Domingo Mend, PA  meloxicam (MOBIC) 15 MG tablet Take 1 tablet (15 mg total) by mouth daily. 02/15/18  Yes Jeffery,  Chelle, PA  topiramate (TOPAMAX) 25 MG tablet Take 25 mg by mouth at bedtime. 07/21/19  Yes [provider]  aspirin EC 81 MG tablet Take 1 tablet (81 mg total) by mouth 2 (two) times daily. 02/11/20   Leandrew Koyanagi, MD  HYDROcodone-acetaminophen (NORCO/VICODIN) 5-325 MG tablet Take 1 tablet by mouth every 6 (six) hours as needed for moderate pain. Patient not taking: Reported on 02/02/2020 08/03/19   Newt Minion, MD  meclizine (ANTIVERT) 25 MG tablet Take 1 tablet (25 mg total) by mouth 3 (three) times daily as needed for dizziness. Patient not taking: Reported on 02/02/2020 02/15/18   Harrison Mons, PA  methocarbamol (ROBAXIN) 500 MG tablet Take 1 tablet (500 mg total) by mouth every 6 (six) hours as needed for muscle spasms. 02/11/20   Leandrew Koyanagi, MD  ondansetron (ZOFRAN) 4 MG tablet Take 1-2 tablets (4-8 mg total) by mouth every 8 (eight) hours as needed for nausea or vomiting. 02/11/20   Leandrew Koyanagi, MD  oxyCODONE-acetaminophen (PERCOCET) 5-325 MG tablet Take 1-2 tablets by mouth every 8 (eight) hours as needed for severe pain. 02/11/20   Leandrew Koyanagi, MD     Positive ROS: All other systems have been reviewed and were otherwise negative with the exception of those mentioned in the HPI and as above.  Physical Exam: General: Alert, no acute distress Cardiovascular: No pedal edema Respiratory: No cyanosis, no use of accessory musculature GI: abdomen soft Skin: No lesions in the area of chief complaint Neurologic: Sensation intact distally Psychiatric: Patient is competent for consent with normal mood and affect Lymphatic: no lymphedema  MUSCULOSKELETAL: exam stable  Assessment: left hip osteoarthritis  Plan: Plan for Procedure(s): LEFT TOTAL HIP ARTHROPLASTY ANTERIOR APPROACH  The risks benefits and alternatives were discussed with the patient including but not limited to the risks of nonoperative treatment, versus surgical intervention including infection, bleeding,  nerve injury,  blood clots, cardiopulmonary complications, morbidity, mortality, among others, and they were willing to proceed.   Preoperative templating of the joint replacement has been completed, documented, and submitted to the Operating Room personnel in order to optimize intra-operative equipment management.   Eduard Roux, MD 02/12/2020 7:01 AM

## 2020-02-12 NOTE — Anesthesia Procedure Notes (Signed)
Spinal  Patient location during procedure: OR Start time: 02/12/2020 9:54 AM End time: 02/12/2020 9:57 AM Staffing Performed: anesthesiologist  Anesthesiologist: Josephine Igo, MD Preanesthetic Checklist Completed: patient identified, IV checked, site marked, risks and benefits discussed, surgical consent, monitors and equipment checked, pre-op evaluation and timeout performed Spinal Block Patient position: sitting Prep: DuraPrep and site prepped and draped Patient monitoring: heart rate, cardiac monitor, continuous pulse ox and blood pressure Approach: midline Location: L3-4 Injection technique: single-shot Needle Needle type: Pencan  Needle gauge: 24 G Needle length: 9 cm Needle insertion depth: 6 cm Assessment Sensory level: T4 Additional Notes Patient tolerated procedure well. Adequate sensory level.

## 2020-02-12 NOTE — Op Note (Signed)
LEFT TOTAL HIP ARTHROPLASTY ANTERIOR APPROACH  Procedure Note Brandi Harrington   [name]:[id number]  Pre-op Diagnosis: left hip osteoarthritis     Post-op Diagnosis: same   Operative Procedures  1. Total hip replacement; Left hip; uncemented [id number]   Personnel  Surgeon(s): Leandrew Koyanagi, MD  Assist: April Green, RNFA   Anesthesia: spinal  Prosthesis: Depuy Acetabulum: Pinnacle 52 mm Femur: Actis 3 STD Head: 36 mm size: +8.5 Liner: +4 neutral Bearing Type: ceramic on poly  Total Hip Arthroplasty (Anterior Approach) Op Note:  After informed consent was obtained and the operative extremity marked in the holding area, the patient was brought back to the operating room and placed supine on the HANA table. Next, the operative extremity was prepped and draped in normal sterile fashion. Surgical timeout occurred verifying patient identification, surgical site, surgical procedure and administration of antibiotics.  A modified anterior Smith-Peterson approach to the hip was performed, using the interval between tensor fascia lata and sartorius.  Dissection was carried bluntly down onto the anterior hip capsule. The lateral femoral circumflex vessels were identified and coagulated. A capsulotomy was performed and the capsular flaps tagged for later repair.  The neck osteotomy was performed. The femoral head was removed, the acetabular rim was cleared of soft tissue and attention was turned to reaming the acetabulum.  The anterior acetabulum was deficient so reaming was performed mainly of the posterior acetabular wall. Sequential reaming was performed under fluoroscopic guidance. We reamed to a size 51 mm, and then impacted the acetabular shell. Two 25 mm cancellous screws were placed through the shell for added fixation.  The liner was then placed after irrigation and attention turned to the femur.  After placing the femoral hook, the leg was taken to externally rotated, extended and adducted position  taking care to perform soft tissue releases to allow for adequate mobilization of the femur. Soft tissue was cleared from the shoulder of the greater trochanter and the hook elevator used to improve exposure of the proximal femur. Sequential broaching performed up to a size 3. Trial neck and head were placed. The leg was brought back up to neutral and the construct reduced.  Antibiotic irrigation was placed in the surgical wound and kept for at least 1 minute.  The position and sizing of components, offset and leg lengths were checked using fluoroscopy. Stability of the construct was checked in extension and external rotation without any subluxation or impingement of prosthesis. We dislocated the prosthesis, dropped the leg back into position, removed trial components, and irrigated copiously. The final stem and head was then placed, the leg brought back up, the system reduced and fluoroscopy used to verify positioning.  We irrigated, obtained hemostasis and closed the capsule using #2 ethibond suture.  One gram of vancomycin powder was placed in the surgical bed. The fascia was closed with #1 vicryl plus, the deep fat layer was closed with 0 vicryl, the subcutaneous layers closed with 2.0 Vicryl Plus and the skin closed with 2.0 nylon and dermabond. A sterile dressing was applied. The patient was awakened in the operating room and taken to recovery in stable condition.  All sponge, needle, and instrument counts were correct at the end of the case.   Position: supine  Complications: see description of procedure.  Time Out: performed   Drains/Packing: none  Estimated blood loss: see anesthesia record  Returned to Recovery Room: in good condition.   Antibiotics: yes   Mechanical VTE (DVT) Prophylaxis: sequential compression devices,  TED thigh-high  Chemical VTE (DVT) Prophylaxis: aspirin   Fluid Replacement: see anesthesia record  Specimens Removed: 1 to pathology   Sponge and Instrument Count  Correct? yes   PACU: portable radiograph - low AP   Plan/RTC: Return in 2 weeks for staple removal. Weight Bearing/Load Lower Extremity: full  Hip precautions: none Suture Removal: 2 weeks   N. Eduard Roux, MD [facility] 11:47 AM   Implant Name Type Inv. Item Serial No. Manufacturer Lot No. LRB No. Used Action  CUP SECTOR GRIPTON 50MM - [id number] Cup CUP SECTOR GRIPTON 50MM  DEPUY SYNTHES [id number] Left 1 Implanted and Explanted  PIN SECTOR W/GRIP ACE CUP 52MM - [id number] Hips PIN SECTOR W/GRIP ACE CUP 52MM  DEPUY SYNTHES [id number] Left 1 Implanted  SCREW 6.5MMX25MM - [id number] Screw SCREW 6.5MMX25MM  DEPUY SYNTHES [id number] Left 1 Implanted  SCREW 6.5MMX25MM - [id number] Screw SCREW 6.5MMX25MM  DEPUY SYNTHES [id number] Left 1 Implanted  LINER NEUTRAL 52X36MM PLUS 4 - [id number] Liner LINER NEUTRAL 52X36MM PLUS 4  DEPUY SYNTHES [id number] Left 1 Implanted  STEM FEM SZ3 STD ACTIS - [id number] Stem STEM FEM SZ3 STD ACTIS  DEPUY ORTHOPAEDICS [id number] Left 1 Implanted  HEAD CERAMIC 36 PLUS 8.5 12 14  - [id number] Hips HEAD CERAMIC 36 PLUS 8.5 12 14   DEPUY SYNTHES [id number] Left 1 Implanted

## 2020-02-12 NOTE — Evaluation (Signed)
Physical Therapy Evaluation Patient Details Name: Brandi Harrington MRN: [id number] DOB: 1946/05/19 Today's Date: 02/12/2020   History of Present Illness  Pt is a 74 y/o female s/p L THA, direct anterior. PMH includes HTN, vertigo, and memory loss.   Clinical Impression  Pt s/p surgery above with deficits below. Session limited secondary to nausea/vomiting. Pt requiring min A to stand at EOB using RW. Pt with notable memory deficits, poor safety awareness, and easily distracted throughout session. Feel she will require 24/7 supervision at home to ensure safety at d/c. Will continue to follow acutely to maximize functional mobility independence and safety.     Follow Up Recommendations Follow surgeon's recommendation for DC plan and follow-up therapies;Supervision/Assistance - 24 hour    Equipment Recommendations  Rolling walker with 5" wheels;3in1 (PT)    Recommendations for Other Services OT consult     Precautions / Restrictions Precautions Precautions: Fall Restrictions Weight Bearing Restrictions: Yes LLE Weight Bearing: Weight bearing as tolerated      Mobility  Bed Mobility Overal bed mobility: Needs Assistance Bed Mobility: Supine to Sit;Sit to Supine     Supine to sit: Min assist Sit to supine: Min assist   General bed mobility comments: Min A for LLE assist throughout bed mobility. Increased time required with cues for sequencing.   Transfers Overall transfer level: Needs assistance Equipment used: Rolling walker (2 wheeled) Transfers: Sit to/from Stand Sit to Stand: Min assist         General transfer comment: Min A for steadying assist. Cues for safe hand placement. Once standing, pt with increased sway. When PT asked if she was ok, pt replied "yes", however, then abruptly sat at EOB, and started dry heaving. Had to get pt emesis basin as pt ended up vomiting. Notified RN. Further mobility deferred.   Ambulation/Gait                Stairs             Wheelchair Mobility    Modified Rankin (Stroke Patients Only)       Balance Overall balance assessment: Needs assistance Sitting-balance support: No upper extremity supported;Feet supported Sitting balance-Leahy Scale: Fair     Standing balance support: Bilateral upper extremity supported;During functional activity Standing balance-Leahy Scale: Poor Standing balance comment: Reliant on BUE support                              Pertinent Vitals/Pain Pain Assessment: Faces Faces Pain Scale: Hurts whole lot Pain Location: L hip  Pain Descriptors / Indicators: Aching;Operative site guarding Pain Intervention(s): Limited activity within patient's tolerance;Monitored during session;Repositioned    Home Living Family/patient expects to be discharged to:: Private residence Living Arrangements: Alone Available Help at Discharge: Family;Available PRN/intermittently Type of Home: House Home Access: Level entry     Home Layout: One level Home Equipment: None Additional Comments: Unsure of accuracy as pt with cognitive deficits.     Prior Function Level of Independence: Independent               Hand Dominance        Extremity/Trunk Assessment   Upper Extremity Assessment Upper Extremity Assessment: Defer to OT evaluation    Lower Extremity Assessment Lower Extremity Assessment: LLE deficits/detail LLE Deficits / Details: Deficits consistent with post op pain and weakness.     Cervical / Trunk Assessment Cervical / Trunk Assessment: Normal  Communication   Communication: No difficulties  Cognition Arousal/Alertness: Awake/alert Behavior During Therapy: WFL for tasks assessed/performed Overall Cognitive Status: No family/caregiver present to determine baseline cognitive functioning                                 General Comments: Pt with memory deficits at baseline. Pt with poor safety awareness and easily distractible.        General Comments      Exercises     Assessment/Plan    PT Assessment Patient needs continued PT services  PT Problem List Decreased strength;Decreased balance;Decreased activity tolerance;Decreased mobility;Decreased knowledge of use of DME;Decreased cognition;Decreased safety awareness;Pain       PT Treatment Interventions DME instruction;Gait training;Stair training;Functional mobility training;Therapeutic activities;Therapeutic exercise;Balance training;Cognitive remediation;Patient/family education    PT Goals (Current goals can be found in the Care Plan section)  Acute Rehab PT Goals Patient Stated Goal: To decrease pain  PT Goal Formulation: With patient Time For Goal Achievement: 02/26/20 Potential to Achieve Goals: Good    Frequency 7X/week   Barriers to discharge        Co-evaluation               AM-PAC PT "6 Clicks" Mobility  Outcome Measure Help needed turning from your back to your side while in a flat bed without using bedrails?: A Little Help needed moving from lying on your back to sitting on the side of a flat bed without using bedrails?: A Little Help needed moving to and from a bed to a chair (including a wheelchair)?: A Little Help needed standing up from a chair using your arms (e.g., wheelchair or bedside chair)?: A Little Help needed to walk in hospital room?: A Little Help needed climbing 3-5 steps with a railing? : A Lot 6 Click Score: 17    End of Session Equipment Utilized During Treatment: Gait belt Activity Tolerance: Treatment limited secondary to medical complications (Comment)(vomiting) Patient left: in bed;with call bell/phone within reach;with bed alarm set Nurse Communication: Mobility status;Other (comment)(pt with n/v) PT Visit Diagnosis: Unsteadiness on feet (R26.81);Difficulty in walking, not elsewhere classified (R26.2);Pain Pain - Right/Left: Left Pain - part of body: Hip    Time: [id number] PT Time Calculation (min)  (ACUTE ONLY): 18 min   Charges:   PT Evaluation $PT Eval Low Complexity: 1 Low          Lou Miner, DPT  Acute Rehabilitation Services  Pager: [phone] Office: [phone]   Rudean Hitt 02/12/2020, 6:02 PM

## 2020-02-13 ENCOUNTER — Encounter: Payer: Self-pay | Admitting: *Deleted

## 2020-02-13 DIAGNOSIS — M1612 Unilateral primary osteoarthritis, left hip: Secondary | ICD-10-CM | POA: Diagnosis not present

## 2020-02-13 LAB — BASIC METABOLIC PANEL
Anion gap: 9 (ref 5–15)
BUN: 13 mg/dL (ref 8–23)
CO2: 24 mmol/L (ref 22–32)
Calcium: 8.3 mg/dL — ABNORMAL LOW (ref 8.9–10.3)
Chloride: 110 mmol/L (ref 98–111)
Creatinine, Ser: 1.13 mg/dL — ABNORMAL HIGH (ref 0.44–1.00)
GFR calc Af Amer: 57 mL/min — ABNORMAL LOW (ref >60)
GFR calc non Af Amer: 49 mL/min — ABNORMAL LOW (ref >60)
Glucose, Bld: 107 mg/dL — ABNORMAL HIGH (ref 70–99)
Potassium: 4.7 mmol/L (ref 3.5–5.1)
Sodium: 143 mmol/L (ref 135–145)

## 2020-02-13 LAB — CBC
HCT: 30 % — ABNORMAL LOW (ref 36.0–46.0)
Hemoglobin: 10 g/dL — ABNORMAL LOW (ref 12.0–15.0)
MCH: 33.3 pg (ref 26.0–34.0)
MCHC: 33.3 g/dL (ref 30.0–36.0)
MCV: 100 fL (ref 80.0–100.0)
Platelets: 227 10*3/uL (ref 150–400)
RBC: 3 MIL/uL — ABNORMAL LOW (ref 3.87–5.11)
RDW: 13.2 % (ref 11.5–15.5)
WBC: 11.1 10*3/uL — ABNORMAL HIGH (ref 4.0–10.5)
nRBC: 0 % (ref 0.0–0.2)

## 2020-02-13 NOTE — TOC Initial Note (Signed)
Transition of Care [facility]) - Initial/Assessment Note    Patient Details  Name: Brandi Harrington MRN: [id number] Date of Birth: 07-24-1946  Transition of Care [facility]) CM/SW Contact:    Curlene Labrum, RN Phone Number: 02/13/2020, 3:40 PM  Clinical Narrative:                 Case management met with the patient at the bedside.  Patient is a S/P Left hip arthroplasty who lives at home alone with family living nearby.  Patient was unsure as to family staying with her for 24 hour supervision.  Patient with intermittent confusion and short attention spans.  Patient does not have any dme at home prior to this surgery. I spoke with the patient about possible SNF placement due to her living at home alone and patient was agreeable.  I will do a SNF workup and if patient improves and is safely able to go home then she is being followed by [facility] at home, but will need a rolling walker and 3:1.  Patient is expected to discharge to SNF.  Expected Discharge Plan: Skilled Nursing Facility Barriers to Discharge: (lives at home alone)   Patient Goals and CMS Choice Patient states their goals for this hospitalization and ongoing recovery are:: I'm better today but I live alone and am fine going to a skilled facility for therapies. CMS Medicare.gov Compare Post Acute Care list provided to:: Patient Choice offered to / list presented to : Patient  Expected Discharge Plan and Services Expected Discharge Plan: [place]   Discharge Planning Services: CM Consult   Living arrangements for the past 2 months: Single Family Home                 DME Arranged: (Deferred to skilled facility)                    Prior Living Arrangements/Services Living arrangements for the past 2 months: Single Family Home Lives with:: Self Patient language and need for interpreter reviewed:: Yes Do you feel safe going back to the place where you live?: No   Patient intermittantly confused.  Patient  lives alone with family living next door to her.  Need for Family Participation in Patient Care: Yes (Comment) Care giver support system in place?: Yes (comment)(Patient lives home alone with family living next door.)   Criminal Activity/Legal Involvement Pertinent to Current Situation/Hospitalization: No - Comment as needed  Activities of Daily Living Home Assistive Devices/Equipment: Dentures (specify type) ADL Screening (condition at time of admission) Patient's cognitive ability adequate to safely complete daily activities?: Yes Is the patient deaf or have difficulty hearing?: No Does the patient have difficulty seeing, even when wearing glasses/contacts?: No Does the patient have difficulty concentrating, remembering, or making decisions?: Yes Patient able to express need for assistance with ADLs?: Yes Does the patient have difficulty dressing or bathing?: Yes Independently performs ADLs?: No Does the patient have difficulty walking or climbing stairs?: Yes Weakness of Legs: Left Weakness of Arms/Hands: None  Permission Sought/Granted Permission sought to share information with : Case Manager Permission granted to share information with : Yes, Verbal Permission Granted     Permission granted to share info w AGENCY: SNF facilities     Permission granted to share info w Contact Information: family  Emotional Assessment Appearance:: Appears stated age Attitude/Demeanor/Rapport: Engaged, Inconsistent Affect (typically observed): Accepting, Apprehensive Orientation: : Oriented to Self, Oriented to Place, Oriented to  Time, Oriented to Situation, Fluctuating  Orientation (Suspected and/or reported Sundowners) Alcohol / Substance Use: Not Applicable Psych Involvement: No (comment)  Admission diagnosis:  Status post total replacement of left hip [Z96.642] Patient Active Problem List   Diagnosis Date Noted  . Status post total replacement of left hip 02/12/2020  . Primary  osteoarthritis of left hip 12/26/2019  . Memory loss 02/02/2019  . Undiagnosed cardiac murmurs 03/03/2017  . DDD (degenerative disc disease), cervical 11/17/2016  . Unilateral hearing loss, right 10/20/2016  . Vertigo 10/20/2016  . Tinnitus, right ear 09/12/2016  . BMI 29.0-29.9,adult 06/25/2016  . Facial paresthesia 02/26/2015  . Lipoma of back 02/26/2015  . Tobacco use 01/18/2015  . Hepatitis C antibody test positive 02/15/2013  . HTN (hypertension) 05/12/2012  . Hyperlipidemia 05/12/2012  . Osteopenia   . TMJ syndrome   . Vitamin D deficiency    PCP:  Harrison Mons, PA Pharmacy:   [facility] [address] ([address]), [place] [address] [place] [address] Phone: [phone] Fax: [phone]     Social Determinants of Health (SDOH) Interventions    Readmission Risk Interventions No flowsheet data found.

## 2020-02-13 NOTE — Progress Notes (Signed)
   Subjective:  Patient reports pain as mild.    Objective:   VITALS:   Vitals:   02/12/20 1356 02/12/20 1950 02/13/20 0130 02/13/20 0334  BP: 92/71 90/79 110/71 114/69  Pulse: 99 63 75 (!) 58  Resp: 14 14 16 15   Temp: 98 F (36.7 C) (!) 97.5 F (36.4 C)  (!) 97.4 F (36.3 C)  TempSrc:  Oral  Oral  SpO2: 98% 100% 96% 99%  Weight:      Height:        Neurovascular intact Sensation intact distally Intact pulses distally Dorsiflexion/Plantar flexion intact Incision: dressing C/D/I and no drainage No cellulitis present Compartment soft   Lab Results  Component Value Date   WBC 11.1 (H) 02/13/2020   HGB 10.0 (L) 02/13/2020   HCT 30.0 (L) 02/13/2020   MCV 100.0 02/13/2020   PLT 227 02/13/2020     Assessment/Plan:  1 Day Post-Op   - Expected postop acute blood loss anemia - will monitor for symptoms - Up with PT/OT - didn't do great with PT yesterday - DVT ppx - SCDs, ambulation, aspirin - WBAT operative extremity - Pain control - Discharge planning - hopefully home after PT today or tomorrow  Eduard Roux 02/13/2020, 7:35 AM [phone]

## 2020-02-13 NOTE — Progress Notes (Signed)
Physical Therapy Treatment Patient Details Name: Brandi Harrington MRN: [id number] DOB: 05-24-1946 Today's Date: 02/13/2020    History of Present Illness Pt is a 74 y/o female s/p L THA, direct anterior. PMH includes HTN, vertigo, and memory loss.     PT Comments    Patient received in recliner. Continues to have some disjointed thoughts, easily distracted, does not know why she is here. Decreased impulsivity noted this afternoon. Improved ambulation distance. Continues to require supervision for safety with mobility and cues for safety when using AD or with transitions. She will continue to benefit from skilled PT to improve strength and functional independence for return home with assist at discharge.      Follow Up Recommendations  Follow surgeon's recommendation for DC plan and follow-up therapies;Supervision for mobility/OOB;Supervision/Assistance - 24 hour     Equipment Recommendations  Rolling walker with 5" wheels;3in1 (PT)    Recommendations for Other Services       Precautions / Restrictions Precautions Precautions: Fall Restrictions Weight Bearing Restrictions: No LLE Weight Bearing: Weight bearing as tolerated    Mobility  Bed Mobility Overal bed mobility: Modified Independent Bed Mobility: Sit to Supine     Supine to sit: Modified independent (Device/Increase time) Sit to supine: Modified independent (Device/Increase time)   General bed mobility comments: patient was able to get left LE back up on bed with use of UEs to assist.  Transfers Overall transfer level: Needs assistance Equipment used: Rolling walker (2 wheeled) Transfers: Sit to/from Stand Sit to Stand: Supervision         General transfer comment: minA for safe hand placement and stability in standing  Ambulation/Gait Ambulation/Gait assistance: Min guard Gait Distance (Feet): 125 Feet Assistive device: Rolling walker (2 wheeled) Gait Pattern/deviations: Step-through pattern;Wide base of  support Gait velocity: decreased   General Gait Details: patient requires cues to stay inside RW, especially with turning. Wide bos occasionally kicking leg of walker. Improved ability this afternoon with gait.   Stairs             Wheelchair Mobility    Modified Rankin (Stroke Patients Only)       Balance Overall balance assessment: Needs assistance Sitting-balance support: Feet supported Sitting balance-Leahy Scale: Good     Standing balance support: Bilateral upper extremity supported;During functional activity Standing balance-Leahy Scale: Fair Standing balance comment: Able to release walker at times for static standing. Requires supervision and RW with mobility for safety.                            Cognition Arousal/Alertness: Awake/alert Behavior During Therapy: WFL for tasks assessed/performed Overall Cognitive Status: No family/caregiver present to determine baseline cognitive functioning                                 General Comments: She does not know why she is here, not sure why bandage is on leg. Reports her brother lives next door      Exercises Total Joint Exercises Ankle Circles/Pumps: AROM;10 reps;Supine;Both Heel Slides: AROM;10 reps;Left;Supine Hip ABduction/ADduction: AAROM;Supine;Left;10 reps Long Arc Quad: AROM;Seated;Both;15 reps    General Comments General comments (skin integrity, edema, etc.): vss      Pertinent Vitals/Pain Pain Assessment: Faces Faces Pain Scale: Hurts a little bit Pain Location: L hip  Pain Descriptors / Indicators: Burning Pain Intervention(s): Monitored during session;Repositioned    Home Living Family/patient expects  to be discharged to:: Private residence Living Arrangements: Alone Available Help at Discharge: Family;Available PRN/intermittently Type of Home: House Home Access: Level entry   Home Layout: One level Home Equipment: None Additional Comments: Unsure of  accuracy as pt with cognitive deficits.     Prior Function Level of Independence: Independent      Comments: pt reports she is managing her own finances, medications and cooks independently   PT Goals (current goals can now be found in the care plan section) Acute Rehab PT Goals Patient Stated Goal: to go home PT Goal Formulation: With patient Time For Goal Achievement: 02/26/20 Potential to Achieve Goals: Good Progress towards PT goals: Progressing toward goals    Frequency    BID      PT Plan Current plan remains appropriate    Co-evaluation              AM-PAC PT "6 Clicks" Mobility   Outcome Measure  Help needed turning from your back to your side while in a flat bed without using bedrails?: None Help needed moving from lying on your back to sitting on the side of a flat bed without using bedrails?: None Help needed moving to and from a bed to a chair (including a wheelchair)?: A Little Help needed standing up from a chair using your arms (e.g., wheelchair or bedside chair)?: A Little Help needed to walk in hospital room?: A Little Help needed climbing 3-5 steps with a railing? : A Little 6 Click Score: 20    End of Session Equipment Utilized During Treatment: Gait belt Activity Tolerance: Patient limited by pain;Patient limited by fatigue Patient left: in bed;with bed alarm set;with call bell/phone within reach Nurse Communication: Mobility status PT Visit Diagnosis: Muscle weakness (generalized) (M62.81);Difficulty in walking, not elsewhere classified (R26.2);Pain Pain - Right/Left: Left Pain - part of body: Hand     Time: 1610-9604 PT Time Calculation (min) (ACUTE ONLY): 23 min  Charges:  $Gait Training: 8-22 mins $Therapeutic Exercise: 8-22 mins                     Quinn Bartling, PT, GCS 02/13/20,1:34 PM

## 2020-02-13 NOTE — Evaluation (Signed)
Occupational Therapy Evaluation Patient Details Name: Brandi Harrington MRN: [id number] DOB: [birth year] Today's Date: 02/13/2020    History of Present Illness Pt is a 74 y/o female s/p L THA, direct anterior. PMH includes HTN, vertigo, and memory loss.    Clinical Impression   PTA, pt was living at home alone, pt reports she was independent with ADL/IADL and functional mobility. Pt currently demonstrates cognitive limitations (see cognition section) impacting safety with ADL/IADL and functional mobility. Pt scored 28/28 on the Short Blessed Test to assess memory and concentration. She demonstrated difficulty with orientation, problem solving, working memory, attention, and concentration. These limitations present concerns with pt's ability to safely and independently complete medication management, cooking, and finances (which she reports she was independent with at baseline). Pt required minA for functional mobility at RW level. Due to decline in current level of function, pt would benefit from acute OT to address established goals to facilitate safe D/C to venue listed below. At this time, recommend SNF follow-up. Will continue to follow acutely.     Follow Up Recommendations  SNF;Supervision/Assistance - 24 hour    Equipment Recommendations  3 in 1 bedside commode    Recommendations for Other Services       Precautions / Restrictions Precautions Precautions: Fall Restrictions Weight Bearing Restrictions: No LLE Weight Bearing: Weight bearing as tolerated      Mobility Bed Mobility Overal bed mobility: Modified Independent Bed Mobility: Supine to Sit     Supine to sit: Modified independent (Device/Increase time)     General bed mobility comments: pt sitting in recliner upon arrival  Transfers Overall transfer level: Needs assistance Equipment used: Rolling walker (2 wheeled) Transfers: Sit to/from Stand Sit to Stand: Min assist         General transfer comment: minA for  safe hand placement and stability in standing    Balance Overall balance assessment: Needs assistance Sitting-balance support: Feet supported Sitting balance-Leahy Scale: Fair     Standing balance support: Bilateral upper extremity supported;During functional activity;No upper extremity supported Standing balance-Leahy Scale: Poor Standing balance comment: able to wash hands while standing at sink level                           ADL either performed or assessed with clinical judgement   ADL Overall ADL's : Needs assistance/impaired Eating/Feeding: Set up;Sitting   Grooming: Minimal assistance;Standing Grooming Details (indicate cue type and reason): pt standing very far from sink, cues to position self close to sink Upper Body Bathing: Min guard;Sitting   Lower Body Bathing: Minimal assistance;Sit to/from stand   Upper Body Dressing : Min guard;Sitting   Lower Body Dressing: Minimal assistance;Sit to/from stand   Toilet Transfer: Minimal assistance;Ambulation;RW Toilet Transfer Details (indicate cue type and reason): minA for safety, stability and navigating around furniture in [place] and Hygiene: Minimal assistance;Sit to/from stand       Functional mobility during ADLs: Minimal assistance;Rolling walker General ADL Comments: pt limited by pain, instability, and cognition     Vision Baseline Vision/History: Wears glasses Wears Glasses: Reading only Patient Visual Report: No change from baseline Additional Comments: pt able to read note (Press red button on call bell) provided by therapist     Perception     Praxis      Pertinent Vitals/Pain Pain Assessment: 0-10 Faces Pain Scale: Hurts even more Pain Location: L hip  Pain Descriptors / Indicators: Burning Pain Intervention(s): Monitored during session;Limited  activity within patient's tolerance     Hand Dominance Right   Extremity/Trunk Assessment Upper Extremity  Assessment Upper Extremity Assessment: Overall WFL for tasks assessed   Lower Extremity Assessment Lower Extremity Assessment: LLE deficits/detail LLE Deficits / Details: Deficits consistent with post op pain and weakness.    Cervical / Trunk Assessment Cervical / Trunk Assessment: Normal   Communication Communication Communication: No difficulties   Cognition Arousal/Alertness: Awake/alert Behavior During Therapy: Impulsive Overall Cognitive Status: No family/caregiver present to determine baseline cognitive functioning                                 General Comments: per chart and verbal conversation with Dr. Erlinda Hong, pt with cognitive deficits at baseline, Dr. Erlinda Hong reports pt has difficulty with word finding and problem solving at baseline. Pt completed the Short Blessed Test to assess memory and concentration she scored a 28/28; a score of 7 or more warrants further evalution to rule out dementing disorder, such as Alzheimer's disease; A score of 10 or more is indicative of impairment consistent with dementia;when prompted to provide the correct year, pt responded with April, and requested to skip this question after 3 prompts for the year with pt unable to provide answer. when prompted to repeat name and address Brandi Harrington, [address], [place]) pt stated Brandi Harrington, and was unable to provide additional information;pt reported it was 4:30pm (oriented pt it was 10:30AM);pt unable to count backwards from 20-1, was stuck at 13. she was unable to tell therapist reason for admission and asked if she bled when they (surgeon) cut her skin. Communicated findings with RN and Dr. Erlinda Hong.    General Comments  vss    Exercises Total Joint Exercises Ankle Circles/Pumps: AROM;10 reps;Both Heel Slides: AROM;10 reps;Left Hip ABduction/ADduction: AAROM;10 reps;Left Long Arc Quad: AROM;10 reps;Both   Shoulder Instructions      Home Living Family/patient expects to be discharged to::  Private residence Living Arrangements: Alone Available Help at Discharge: Family;Available PRN/intermittently Type of Home: House Home Access: Level entry     Home Layout: One level               Home Equipment: None   Additional Comments: Unsure of accuracy as pt with cognitive deficits.       Prior Functioning/Environment Level of Independence: Independent        Comments: pt reports she is managing her own finances, medications and cooks independently        OT Problem List: Decreased strength;Decreased range of motion;Decreased activity tolerance;Impaired balance (sitting and/or standing);Decreased cognition;Decreased safety awareness;Decreased knowledge of use of DME or AE;Decreased knowledge of precautions;Pain      OT Treatment/Interventions: Self-care/ADL training;Therapeutic exercise;DME and/or AE instruction;Therapeutic activities;Patient/family education;Balance training;Cognitive remediation/compensation    OT Goals(Current goals can be found in the care plan section) Acute Rehab OT Goals Patient Stated Goal: to go home OT Goal Formulation: With patient Time For Goal Achievement: 02/27/20 Potential to Achieve Goals: Good ADL Goals Pt Will Perform Grooming: with modified independence;standing Pt Will Perform Lower Body Dressing: with modified independence;sit to/from stand Pt Will Transfer to Toilet: with modified independence;ambulating Additional ADL Goal #1: Pt will demonstrate independence with completion of medication management. Additional ADL Goal #2: Pt will demonstrate anticipatory awareness for safe completion of ADL/IADL.  OT Frequency: Min 2X/week   Barriers to D/C: Decreased caregiver support  pt lives alone       Co-evaluation  AM-PAC OT "6 Clicks" Daily Activity     Outcome Measure Help from another person eating meals?: A Little Help from another person taking care of personal grooming?: A Little Help from another  person toileting, which includes using toliet, bedpan, or urinal?: A Little Help from another person bathing (including washing, rinsing, drying)?: A Little Help from another person to put on and taking off regular upper body clothing?: A Little Help from another person to put on and taking off regular lower body clothing?: A Little 6 Click Score: 18   End of Session Equipment Utilized During Treatment: Gait belt;Rolling walker Nurse Communication: Mobility status;Other (comment)(cognition)  Activity Tolerance: Patient tolerated treatment well Patient left: in chair;with call bell/phone within reach;with chair alarm set  OT Visit Diagnosis: Unsteadiness on feet (R26.81);Other abnormalities of gait and mobility (R26.89);Muscle weakness (generalized) (M62.81);History of falling (Z91.81);Pain Pain - Right/Left: Left Pain - part of body: Hip                Time: [id number] OT Time Calculation (min): 29 min Charges:  OT General Charges $OT Visit: 1 Visit OT Evaluation $OT Eval Moderate Complexity: 1 Mod OT Treatments $Self Care/Home Management : 8-22 mins  Helene Kelp OTR/L Acute Rehabilitation Services Office: [phone]   Wyn Forster 02/13/2020, 11:39 AM

## 2020-02-13 NOTE — NC FL2 (Signed)
[place] LEVEL OF CARE SCREENING TOOL     IDENTIFICATION  Patient Name: Brandi Harrington Birthdate: 1946-01-02 Sex: female Admission Date (Current Location): 02/12/2020  [facility] and [place] Number:  Herbalist and Address:  [facility], [place] [address], [place], [place] [address]      Provider Number: [id number]  Attending Physician Name and Address:  Leandrew Koyanagi, MD  Relative Name and Phone Number:  Stoney Bang, sister [phone]    Current Level of Care: Hospital Recommended Level of Care: [place] Prior Approval Number:    Date Approved/Denied: 02/13/20 PASRR Number: [id number]  Discharge Plan: SNF    Current Diagnoses: Patient Active Problem List   Diagnosis Date Noted  . Status post total replacement of left hip 02/12/2020  . Primary osteoarthritis of left hip 12/26/2019  . Memory loss 02/02/2019  . Undiagnosed cardiac murmurs 03/03/2017  . DDD (degenerative disc disease), cervical 11/17/2016  . Unilateral hearing loss, right 10/20/2016  . Vertigo 10/20/2016  . Tinnitus, right ear 09/12/2016  . BMI 29.0-29.9,adult 06/25/2016  . Facial paresthesia 02/26/2015  . Lipoma of back 02/26/2015  . Tobacco use 01/18/2015  . Hepatitis C antibody test positive 02/15/2013  . HTN (hypertension) 05/12/2012  . Hyperlipidemia 05/12/2012  . Osteopenia   . TMJ syndrome   . Vitamin D deficiency     Orientation RESPIRATION BLADDER Height & Weight     Self, Time, Situation, Place  Normal Continent Weight: 69.4 kg Height:  5\' 1"  (154.9 cm)  BEHAVIORAL SYMPTOMS/MOOD NEUROLOGICAL BOWEL NUTRITION STATUS      Continent Diet(See Discharge Summary)  AMBULATORY STATUS COMMUNICATION OF NEEDS Skin   Limited Assist Verbally Surgical wounds                       Personal Care Assistance Level of Assistance  Bathing, Dressing Bathing Assistance: Limited assistance   Dressing Assistance: Limited  assistance     Functional Limitations Info  Sight, Hearing, Speech Sight Info: Impaired Hearing Info: Adequate Speech Info: Adequate    SPECIAL CARE FACTORS FREQUENCY  PT (By licensed PT), OT (By licensed OT)     PT Frequency: 5 times per week OT Frequency: 5 times per week            Contractures Contractures Info: Not present    Additional Factors Info  Code Status, Allergies Code Status Info: Full Allergies Info: NKDA           Current Medications (02/13/2020):  This is the current hospital active medication list Current Facility-Administered Medications  Medication Dose Route Frequency Provider Last Rate Last Admin  . 0.9 %  sodium chloride infusion   Intravenous Continuous Leandrew Koyanagi, MD 75 mL/hr at 02/13/20 0529 New Bag at 02/13/20 0529  . acetaminophen (TYLENOL) tablet 1,000 mg  1,000 mg Oral Q6H Leandrew Koyanagi, MD   1,000 mg at 02/13/20 1331  . acetaminophen (TYLENOL) tablet 325-650 mg  325-650 mg Oral Q6H PRN Leandrew Koyanagi, MD      . alum & mag hydroxide-simeth (MAALOX/MYLANTA) 200-200-20 MG/5ML suspension 30 mL  30 mL Oral Q4H PRN Leandrew Koyanagi, MD      . amLODipine (NORVASC) tablet 10 mg  10 mg Oral Daily Leandrew Koyanagi, MD   10 mg at 02/13/20 0901  . aspirin chewable tablet 81 mg  81 mg Oral BID Leandrew Koyanagi, MD   81 mg at 02/13/20 0900  .  Chlorhexidine Gluconate Cloth 2 % PADS 6 each  6 each Topical Daily Leandrew Koyanagi, MD      . diphenhydrAMINE (BENADRYL) 12.5 MG/5ML elixir 25 mg  25 mg Oral Q4H PRN Leandrew Koyanagi, MD      . docusate sodium (COLACE) capsule 100 mg  100 mg Oral BID Leandrew Koyanagi, MD   100 mg at 02/13/20 0900  . gabapentin (NEURONTIN) capsule 300 mg  300 mg Oral TID Leandrew Koyanagi, MD   300 mg at 02/13/20 0900  . HYDROmorphone (DILAUDID) injection 0.5-1 mg  0.5-1 mg Intravenous Q4H PRN Leandrew Koyanagi, MD   1 mg at 02/12/20 1419  . magnesium citrate solution 1 Bottle  1 Bottle Oral Once PRN Leandrew Koyanagi, MD      . menthol-cetylpyridinium  (CEPACOL) lozenge 3 mg  1 lozenge Oral PRN Leandrew Koyanagi, MD       Or  . phenol (CHLORASEPTIC) mouth spray 1 spray  1 spray Mouth/Throat PRN Leandrew Koyanagi, MD      . methocarbamol (ROBAXIN) tablet 750 mg  750 mg Oral Q6H PRN Leandrew Koyanagi, MD   750 mg at 02/12/20 1258   Or  . methocarbamol (ROBAXIN) 500 mg in dextrose 5 % 50 mL IVPB  500 mg Intravenous Q6H PRN Leandrew Koyanagi, MD      . metoCLOPramide (REGLAN) tablet 5-10 mg  5-10 mg Oral Q8H PRN Leandrew Koyanagi, MD       Or  . metoCLOPramide (REGLAN) injection 5-10 mg  5-10 mg Intravenous Q8H PRN Leandrew Koyanagi, MD      . ondansetron [facility]) tablet 4 mg  4 mg Oral Q6H PRN Leandrew Koyanagi, MD       Or  . ondansetron [facility]) injection 4 mg  4 mg Intravenous Q6H PRN Leandrew Koyanagi, MD   4 mg at 02/12/20 1950  . oxyCODONE (Oxy IR/ROXICODONE) immediate release tablet 10-15 mg  10-15 mg Oral Q4H PRN Leandrew Koyanagi, MD      . oxyCODONE (Oxy IR/ROXICODONE) immediate release tablet 5-10 mg  5-10 mg Oral Q4H PRN Leandrew Koyanagi, MD   5 mg at 02/12/20 1251  . oxyCODONE (OXYCONTIN) 12 hr tablet 10 mg  10 mg Oral Q12H Leandrew Koyanagi, MD   10 mg at 02/13/20 0901  . polyethylene glycol (MIRALAX / GLYCOLAX) packet 17 g  17 g Oral Daily PRN Leandrew Koyanagi, MD      . sorbitol 70 % solution 30 mL  30 mL Oral Daily PRN Leandrew Koyanagi, MD      . topiramate (TOPAMAX) tablet 25 mg  25 mg Oral QHS Leandrew Koyanagi, MD   25 mg at 02/12/20 2139     Discharge Medications: Please see discharge summary for a list of discharge medications.  Relevant Imaging Results:  Relevant Lab Results:   Additional Information SS# [id number]  Curlene Labrum, RN

## 2020-02-13 NOTE — Progress Notes (Signed)
Physical Therapy Treatment Patient Details Name: Brandi Harrington MRN: [id number] DOB: [birth year] Today's Date: 02/13/2020    History of Present Illness Pt is a 74 y/o female s/p L THA, direct anterior. PMH includes HTN, vertigo, and memory loss.     PT Comments    Patient received in bed resting, agrees to PT. States she slept well last night. Performed bed mobility with mod independence, no physical assist needed. Performed transfer with min guard and cues for safe hand placement. Ambulation of 15 feet with RW and min assist. Somewhat impulsive with mobility, leaning on walker at times, slapping left LE due to burning pain with ambulation. Asking if I can put water on her leg. Patient's ambulation distance limited by pain. She performed LE exercises in chair with cues for pacing and technique. Tends to move very quickly. She will continue to benefit from skilled PT while here to improve safety with mobility and to improve gait distance.        Follow Up Recommendations  Follow surgeon's recommendation for DC plan and follow-up therapies;Supervision/Assistance - 24 hour     Equipment Recommendations  Rolling walker with 5" wheels;3in1 (PT)    Recommendations for Other Services       Precautions / Restrictions Precautions Precautions: Fall Restrictions Weight Bearing Restrictions: No LLE Weight Bearing: Weight bearing as tolerated    Mobility  Bed Mobility Overal bed mobility: Modified Independent Bed Mobility: Supine to Sit     Supine to sit: Modified independent (Device/Increase time)     General bed mobility comments: Performed supine to sit with mod independence, supervision and cues. No physical assist.  Transfers Overall transfer level: Needs assistance Equipment used: Rolling walker (2 wheeled) Transfers: Sit to/from Stand Sit to Stand: Min guard         General transfer comment: Cues needed for hand placement with transfers, Impulsive with mobility. Reports no  dizziness or nausea with mobility.  Ambulation/Gait Ambulation/Gait assistance: Min guard Gait Distance (Feet): 15 Feet Assistive device: Rolling walker (2 wheeled) Gait Pattern/deviations: Step-through pattern;Decreased stride length;Decreased step length - right;Decreased step length - left Gait velocity: decreased   General Gait Details: impulsive at times with mobility. Requires supervision with OOB activities for safety. Gait distance limited by burning pain with weight bearing.   Stairs             Wheelchair Mobility    Modified Rankin (Stroke Patients Only)       Balance Overall balance assessment: Needs assistance Sitting-balance support: Feet supported Sitting balance-Leahy Scale: Fair     Standing balance support: Bilateral upper extremity supported;During functional activity Standing balance-Leahy Scale: Poor Standing balance comment: Reliant on BUE support and supervision                            Cognition Arousal/Alertness: Awake/alert Behavior During Therapy: Impulsive Overall Cognitive Status: No family/caregiver present to determine baseline cognitive functioning                                 General Comments: Pt with memory deficits at baseline. Pt with poor safety awareness and easily distractible.       Exercises Total Joint Exercises Ankle Circles/Pumps: AROM;10 reps;Both Heel Slides: AROM;10 reps;Left Hip ABduction/ADduction: AAROM;10 reps;Left Long Arc Quad: AROM;10 reps;Both    General Comments        Pertinent Vitals/Pain Pain Assessment: Faces Faces  Pain Scale: Hurts little more Pain Location: L hip  Pain Descriptors / Indicators: Burning Pain Intervention(s): Monitored during session;Limited activity within patient's tolerance;Repositioned    Home Living                      Prior Function            PT Goals (current goals can now be found in the care plan section) Acute Rehab PT  Goals Patient Stated Goal: To decrease pain  PT Goal Formulation: With patient Time For Goal Achievement: 02/26/20 Potential to Achieve Goals: Good Progress towards PT goals: Progressing toward goals    Frequency    7X/week      PT Plan Current plan remains appropriate    Co-evaluation              AM-PAC PT "6 Clicks" Mobility   Outcome Measure  Help needed turning from your back to your side while in a flat bed without using bedrails?: None Help needed moving from lying on your back to sitting on the side of a flat bed without using bedrails?: None Help needed moving to and from a bed to a chair (including a wheelchair)?: A Little Help needed standing up from a chair using your arms (e.g., wheelchair or bedside chair)?: A Little Help needed to walk in hospital room?: A Little Help needed climbing 3-5 steps with a railing? : A Lot 6 Click Score: 19    End of Session Equipment Utilized During Treatment: Gait belt Activity Tolerance: Patient limited by pain Patient left: in chair;with chair alarm set;with call bell/phone within reach Nurse Communication: Mobility status PT Visit Diagnosis: Unsteadiness on feet (R26.81);Other abnormalities of gait and mobility (R26.89);Pain Pain - Right/Left: Left Pain - part of body: Hip     Time: 1610-9604 PT Time Calculation (min) (ACUTE ONLY): 18 min  Charges:  $Gait Training: 8-22 mins                     Renaud Celli, PT, GCS 02/13/20,10:51 AM

## 2020-02-13 NOTE — Care Management Obs Status (Signed)
[place] NOTIFICATION   Patient Details  Name: Brandi Harrington MRN: [id number] Date of Birth: 01-02-1946   Medicare Observation Status Notification Given:  Yes    Carles Collet, RN 02/13/2020, 9:26 AM

## 2020-02-14 DIAGNOSIS — M1612 Unilateral primary osteoarthritis, left hip: Secondary | ICD-10-CM | POA: Diagnosis not present

## 2020-02-14 NOTE — Plan of Care (Signed)
  Problem: Education: Goal: Knowledge of General Education information will improve Description: Including pain rating scale, medication(s)/side effects and non-pharmacologic comfort measures Outcome: Progressing   Problem: Activity: Goal: Risk for activity intolerance will decrease Outcome: Progressing   Problem: Pain Managment: Goal: General experience of comfort will improve Outcome: Progressing   

## 2020-02-14 NOTE — Progress Notes (Signed)
   Subjective:  Patient reports pain as mild.    Objective:   VITALS:   Vitals:   02/13/20 1323 02/13/20 1454 02/13/20 2003 02/14/20 0510  BP:  107/66 132/62 129/64  Pulse:  78 77 69  Resp:  14 16 16   Temp:  98.5 F (36.9 C) 98.6 F (37 C) 98.7 F (37.1 C)  TempSrc:   Oral Oral  SpO2: 100% 99% 100% 100%  Weight:      Height:        Neurovascular intact Sensation intact distally Intact pulses distally Dorsiflexion/Plantar flexion intact Incision: dressing C/D/I and no drainage No cellulitis present Compartment soft   Lab Results  Component Value Date   WBC 11.1 (H) 02/13/2020   HGB 10.0 (L) 02/13/2020   HCT 30.0 (L) 02/13/2020   MCV 100.0 02/13/2020   PLT 227 02/13/2020     Assessment/Plan:  2 Days Post-Op   - discharge planning pending PT dispo - stable otherwise  Eduard Roux 02/14/2020, 8:07 AM [phone]

## 2020-02-14 NOTE — Plan of Care (Signed)
  Problem: Pain Managment: Goal: General experience of comfort will improve Outcome: Progressing   Problem: Safety: Goal: Ability to remain free from injury will improve Outcome: Progressing   Problem: Skin Integrity: Goal: Risk for impaired skin integrity will decrease Outcome: Progressing   

## 2020-02-14 NOTE — Discharge Summary (Signed)
Patient ID: Brandi Harrington MRN: [id number] DOB/AGE: 27-Feb-1946 74 y.o.  Admit date: 02/12/2020 Discharge date: 02/14/2020  Admission Diagnoses:  Primary osteoarthritis of left hip  Discharge Diagnoses:  Principal Problem:   Primary osteoarthritis of left hip Active Problems:   Status post total replacement of left hip   Past Medical History:  Diagnosis Date  . Hyperlipidemia   . Hypertension   . Left hip pain 11/09/2017  . Memory loss   . Osteoarthritis of hip   . Osteopenia   . Tinnitus   . TMJ syndrome   . Undiagnosed cardiac murmurs   . Vertigo 09/14/2016  . Vitamin D deficiency     Surgeries: Procedure(s): LEFT TOTAL HIP ARTHROPLASTY ANTERIOR APPROACH on 02/12/2020   Consultants (if any):   Discharged Condition: Improved  Hospital Course: Brandi Harrington is an 74 y.o. female who was admitted 02/12/2020 with a diagnosis of Primary osteoarthritis of left hip and went to the operating room on 02/12/2020 and underwent the above named procedures.    She was given perioperative antibiotics:  Anti-infectives (From admission, onward)   Start     Dose/Rate Route Frequency Ordered Stop   02/12/20 1600  ceFAZolin (ANCEF) IVPB 2g/100 mL premix     2 g 200 mL/hr over 30 Minutes Intravenous Every 6 hours 02/12/20 1359 02/13/20 0404   02/12/20 1049  vancomycin (VANCOCIN) powder  Status:  Discontinued       As needed 02/12/20 1049 02/12/20 1216   02/12/20 0800  ceFAZolin (ANCEF) IVPB 2g/100 mL premix     2 g 200 mL/hr over 30 Minutes Intravenous On call to O.R. 02/12/20 0755 02/12/20 1000    .  She was given sequential compression devices, early ambulation, and appropriate chemoprophylaxis for DVT prophylaxis.  She benefited maximally from the hospital stay and there were no complications.    Recent vital signs:  Vitals:   02/14/20 0510 02/14/20 0826  BP: 129/64 131/68  Pulse: 69 64  Resp: 16 17  Temp: 98.7 F (37.1 C) 98.6 F (37 C)  SpO2: 100% 100%    Recent  laboratory studies:  Lab Results  Component Value Date   HGB 10.0 (L) 02/13/2020   HGB 13.3 02/06/2020   HGB 14.1 07/18/2019   Lab Results  Component Value Date   WBC 11.1 (H) 02/13/2020   PLT 227 02/13/2020   Lab Results  Component Value Date   INR 0.9 02/06/2020   Lab Results  Component Value Date   NA 143 02/13/2020   K 4.7 02/13/2020   CL 110 02/13/2020   CO2 24 02/13/2020   BUN 13 02/13/2020   CREATININE 1.13 (H) 02/13/2020   GLUCOSE 107 (H) 02/13/2020    Discharge Medications:   Allergies as of 02/14/2020   No Known Allergies     Medication List    TAKE these medications   alendronate 70 MG tablet Commonly known as: FOSAMAX TAKE ONE TABLET BY MOUTH ONCE A WEEK WITH  A  FULL  GLASS  OF  WATER  ON  AN  EMPTY  STOMACH What changed: See the new instructions.   amLODipine 10 MG tablet Commonly known as: NORVASC Take 1 tablet (10 mg total) by mouth daily.   aspirin EC 81 MG tablet Take 81 mg by mouth daily.   aspirin EC 81 MG tablet Take 1 tablet (81 mg total) by mouth 2 (two) times daily.   HYDROcodone-acetaminophen 5-325 MG tablet Commonly known as: NORCO/VICODIN Take 1 tablet  by mouth every 6 (six) hours as needed for moderate pain.   lovastatin 40 MG tablet Commonly known as: MEVACOR Take 1 tablet (40 mg total) by mouth at bedtime.   meclizine 25 MG tablet Commonly known as: ANTIVERT Take 1 tablet (25 mg total) by mouth 3 (three) times daily as needed for dizziness.   meloxicam 15 MG tablet Commonly known as: MOBIC Take 1 tablet (15 mg total) by mouth daily.   methocarbamol 500 MG tablet Commonly known as: ROBAXIN Take 1 tablet (500 mg total) by mouth every 6 (six) hours as needed for muscle spasms.   ondansetron 4 MG tablet Commonly known as: ZOFRAN Take 1-2 tablets (4-8 mg total) by mouth every 8 (eight) hours as needed for nausea or vomiting.   oxyCODONE-acetaminophen 5-325 MG tablet Commonly known as: Percocet Take 1-2 tablets by  mouth every 8 (eight) hours as needed for severe pain.   topiramate 25 MG tablet Commonly known as: TOPAMAX Take 25 mg by mouth at bedtime.            Durable Medical Equipment  (From admission, onward)         Start     Ordered   02/12/20 1400  DME Walker rolling  Once    Question:  Patient needs a walker to treat with the following condition  Answer:  History of hip replacement   02/12/20 1359   02/12/20 1400  DME 3 n 1  Once     02/12/20 1359   02/12/20 1400  DME Bedside commode  Once    Question:  Patient needs a bedside commode to treat with the following condition  Answer:  History of hip replacement   02/12/20 1359          Diagnostic Studies: DG Pelvis Portable  Result Date: 02/12/2020 CLINICAL DATA:  LEFT hip arthroplasty EXAM: PORTABLE PELVIS 1-2 VIEWS COMPARISON:  Portable exam 1301 hours compared intraoperative images of 02/12/2020 FINDINGS: Osseous demineralization. LEFT hip prosthesis newly identified. No fracture, dislocation or bone destruction. Significant degenerative changes RIGHT hip with joint space narrowing and subchondral cyst formation. IMPRESSION: LEFT hip prosthesis without acute complication. Significant degenerative changes RIGHT hip joint. Electronically Signed   By: Lavonia Dana M.D.   On: 02/12/2020 13:17   DG C-Arm 1-60 Min  Result Date: 02/12/2020 CLINICAL DATA:  Left hip replacement EXAM: OPERATIVE LEFT HIP WITH PELVIS; DG C-ARM 1-60 MIN COMPARISON:  07/18/2019 FLUOROSCOPY TIME:  Radiation Exposure Index (as provided by the fluoroscopic device): Not of a If the device does not provide the exposure index: Fluoroscopy Time:  47 seconds Number of Acquired Images:  7 FINDINGS: Initial images again demonstrate degenerative change of the left hip joint. Placement of the acetabular component was then performed following removal of the femoral neck and femoral head. Spacing device was then placed. Completion arthroplasty is noted on the final 2 films.  IMPRESSION: Status post left hip replacement. Electronically Signed   By: Inez Catalina M.D.   On: 02/12/2020 16:50   DG HIP OPERATIVE UNILAT W OR W/O PELVIS LEFT  Result Date: 02/12/2020 CLINICAL DATA:  Left hip replacement EXAM: OPERATIVE LEFT HIP WITH PELVIS; DG C-ARM 1-60 MIN COMPARISON:  07/18/2019 FLUOROSCOPY TIME:  Radiation Exposure Index (as provided by the fluoroscopic device): Not of a If the device does not provide the exposure index: Fluoroscopy Time:  47 seconds Number of Acquired Images:  7 FINDINGS: Initial images again demonstrate degenerative change of the left hip joint. Placement of  the acetabular component was then performed following removal of the femoral neck and femoral head. Spacing device was then placed. Completion arthroplasty is noted on the final 2 films. IMPRESSION: Status post left hip replacement. Electronically Signed   By: Inez Catalina M.D.   On: 02/12/2020 16:50    Disposition: Discharge disposition: 01-Home or Self Care       Discharge Instructions    Call MD / Call 911   Complete by: As directed    If you experience chest pain or shortness of breath, CALL 911 and be transported to the hospital emergency room.  If you develope a fever above 101.5 F, pus (white drainage) or increased drainage or redness at the wound, or calf pain, call your surgeon's office.   Constipation Prevention   Complete by: As directed    Drink plenty of fluids.  Prune juice may be helpful.  You may use a stool softener, such as Colace (over the counter) 100 mg twice a day.  Use MiraLax (over the counter) for constipation as needed.   Driving restrictions   Complete by: As directed    No driving while taking narcotic pain meds.   Increase activity slowly as tolerated   Complete by: As directed       Follow-up Information    Leandrew Koyanagi, MD In 2 weeks.   Specialty: Orthopedic Surgery Why: For suture removal, For wound re-check Contact information: [place] [address] [phone]            Signed: Eduard Roux 02/14/2020, 11:20 AM

## 2020-02-14 NOTE — Progress Notes (Signed)
Physical Therapy Treatment Patient Details Name: Brandi Harrington MRN: [id number] DOB: 1946/05/01 Today's Date: 02/14/2020    History of Present Illness Pt is a 74 y/o female s/p L THA, direct anterior. PMH includes HTN, vertigo, and memory loss.     PT Comments    Pt making good progress towards their physical therapy goals, as evidenced by increased ambulation distance to 200 feet with a walker at a min guard assist level. Session focused on gait training and continued instruction on HEP for strengthening. Pt continues with LLE weakness, gait abnormalities, and decreased cognition. D/c plan remains appropriate.     Follow Up Recommendations  Supervision/Assistance - 24 hour;Home health PT     Equipment Recommendations  Rolling walker with 5" wheels;3in1 (PT)    Recommendations for Other Services       Precautions / Restrictions Precautions Precautions: Fall Restrictions Weight Bearing Restrictions: No LLE Weight Bearing: Weight bearing as tolerated    Mobility  Bed Mobility Overal bed mobility: Modified Independent                Transfers Overall transfer level: Needs assistance Equipment used: Rolling walker (2 wheeled) Transfers: Sit to/from Stand Sit to Stand: Supervision         General transfer comment: Cues for hand placement  Ambulation/Gait Ambulation/Gait assistance: Min guard Gait Distance (Feet): 200 Feet Assistive device: Rolling walker (2 wheeled) Gait Pattern/deviations: Step-through pattern;Wide base of support;Decreased stance time - left;Decreased stride length;Decreased weight shift to left Gait velocity: decreased   General Gait Details: Min guard for safety. Cues for walker proximity, decreased L hip circumduction, neutral foot positioning   Stairs             Wheelchair Mobility    Modified Rankin (Stroke Patients Only)       Balance Overall balance assessment: Needs assistance Sitting-balance support: Feet  supported Sitting balance-Leahy Scale: Good     Standing balance support: No upper extremity supported;During functional activity Standing balance-Leahy Scale: Fair                              Cognition Arousal/Alertness: Awake/alert Behavior During Therapy: WFL for tasks assessed/performed Overall Cognitive Status: Impaired/Different from baseline Area of Impairment: Safety/judgement;Awareness;Attention;Problem solving                   Current Attention Level: Sustained Memory: Decreased short-term memory   Safety/Judgement: Decreased awareness of safety;Decreased awareness of deficits Awareness: Intellectual Problem Solving: Difficulty sequencing;Requires verbal cues        Exercises General Exercises - Lower Extremity Long Arc Quad: Left;10 reps;Seated Hip ABduction/ADduction: Both;10 reps;Seated    General Comments        Pertinent Vitals/Pain Pain Assessment: Faces Faces Pain Scale: Hurts little more Pain Location: L hip Pain Descriptors / Indicators: Burning Pain Intervention(s): Monitored during session    Home Living                      Prior Function            PT Goals (current goals can now be found in the care plan section) Acute Rehab PT Goals Patient Stated Goal: less soreness Potential to Achieve Goals: Good Progress towards PT goals: Progressing toward goals    Frequency    7X/week      PT Plan Current plan remains appropriate    Co-evaluation  AM-PAC PT "6 Clicks" Mobility   Outcome Measure  Help needed turning from your back to your side while in a flat bed without using bedrails?: None Help needed moving from lying on your back to sitting on the side of a flat bed without using bedrails?: None Help needed moving to and from a bed to a chair (including a wheelchair)?: None Help needed standing up from a chair using your arms (e.g., wheelchair or bedside chair)?: None Help needed  to walk in hospital room?: A Little Help needed climbing 3-5 steps with a railing? : A Little 6 Click Score: 22    End of Session Equipment Utilized During Treatment: Gait belt Activity Tolerance: Patient tolerated treatment well Patient left: in bed;with call bell/phone within reach;with bed alarm set Nurse Communication: Mobility status PT Visit Diagnosis: Muscle weakness (generalized) (M62.81);Difficulty in walking, not elsewhere classified (R26.2);Pain Pain - Right/Left: Left Pain - part of body: Hand     Time: [id number] PT Time Calculation (min) (ACUTE ONLY): 17 min  Charges:  $Gait Training: 8-22 mins                       Wyona Almas, PT, DPT Acute Rehabilitation Services Pager [phone] Office [phone]    Deno Etienne 02/14/2020, 5:12 PM

## 2020-02-14 NOTE — Progress Notes (Signed)
Pt and daughter given discharge instructions and gone over with them. Both verbalized understanding. All belongings gathered to be sent home. Pt waiting on walker and 3n1 to be delivered then will discharge home.

## 2020-02-14 NOTE — Evaluation (Addendum)
Physical Therapy Treatment Patient Details Name: Brandi Harrington MRN: [id number] DOB: 07/24/1946 Today's Date: 02/14/2020   History of Present Illness  Pt is a 74 y/o female s/p L THA, direct anterior. PMH includes HTN, vertigo, and memory loss.   Clinical Impression  Pt family now reporting they can provide 24/7 assist upon discharge. Pt reporting burning L hip pain with exercises and ambulation, but agreeable to therapy; RN made aware. Pt making steady progress towards her mobility goals, ambulating 50 feet x 2 with a walker, requiring one standing rest break. Displays decreased LLE coordination, weakness, and gait abnormalities. Additionally, she has cognitive impairments and deficits in the areas of memory, attention, problem solving and awareness. Would benefit from follow up HHPT to address deficits and maximize functional mobility.     Follow Up Recommendations Supervision/Assistance - 24 hour;Home health PT    Equipment Recommendations  Rolling walker with 5" wheels;3in1 (PT)    Recommendations for Other Services       Precautions / Restrictions Precautions Precautions: Fall Restrictions Weight Bearing Restrictions: No LLE Weight Bearing: Weight bearing as tolerated      Mobility  Bed Mobility Overal bed mobility: Modified Independent                Transfers Overall transfer level: Needs assistance Equipment used: Rolling walker (2 wheeled) Transfers: Sit to/from Stand Sit to Stand: Min guard            Ambulation/Gait Ambulation/Gait assistance: Min assist Gait Distance (Feet): 100 Feet(50", 50") Assistive device: Rolling walker (2 wheeled) Gait Pattern/deviations: Step-through pattern;Wide base of support;Decreased stance time - left;Decreased stride length;Decreased weight shift to left Gait velocity: decreased   General Gait Details: MinA for stability. Cues for walker proximity, neutral foot positioning. Noted LLE circumduction, decreased  coordination  Stairs            Wheelchair Mobility    Modified Rankin (Stroke Patients Only)       Balance Overall balance assessment: Needs assistance Sitting-balance support: Feet supported Sitting balance-Leahy Scale: Good     Standing balance support: Bilateral upper extremity supported;During functional activity Standing balance-Leahy Scale: Fair                               Pertinent Vitals/Pain Pain Assessment: Faces Faces Pain Scale: Hurts even more Pain Location: L hip Pain Descriptors / Indicators: Burning;Sore Pain Intervention(s): Limited activity within patient's tolerance;Monitored during session;Other (comment)(notified RN)    Home Living                        Prior Function                 Hand Dominance        Extremity/Trunk Assessment                Communication      Cognition Arousal/Alertness: Awake/alert Behavior During Therapy: WFL for tasks assessed/performed Overall Cognitive Status: Impaired/Different from baseline Area of Impairment: Memory;Safety/judgement;Awareness;Attention;Problem solving                   Current Attention Level: Sustained Memory: Decreased short-term memory   Safety/Judgement: Decreased awareness of safety;Decreased awareness of deficits Awareness: Intellectual Problem Solving: Difficulty sequencing;Requires verbal cues General Comments: Pt asking me to pour alcohol on her leg for pain,       General Comments      Exercises General  Exercises - Lower Extremity Long Arc Quad: Left;10 reps;Seated Heel Slides: Left;10 reps;Supine Hip ABduction/ADduction: Left;10 reps;Supine Hip Flexion/Marching: Left;10 reps;Seated   Assessment/Plan    PT Assessment    PT Problem List         PT Treatment Interventions      PT Goals (Current goals can be found in the Care Plan section)  Acute Rehab PT Goals Patient Stated Goal: less soreness Potential to  Achieve Goals: Good    Frequency 7X/week   Barriers to discharge        Co-evaluation               AM-PAC PT "6 Clicks" Mobility  Outcome Measure Help needed turning from your back to your side while in a flat bed without using bedrails?: None Help needed moving from lying on your back to sitting on the side of a flat bed without using bedrails?: None Help needed moving to and from a bed to a chair (including a wheelchair)?: A Little Help needed standing up from a chair using your arms (e.g., wheelchair or bedside chair)?: A Little Help needed to walk in hospital room?: A Little Help needed climbing 3-5 steps with a railing? : A Little 6 Click Score: 20    End of Session Equipment Utilized During Treatment: Gait belt Activity Tolerance: Patient tolerated treatment well Patient left: in chair;with call bell/phone within reach;with chair alarm set Nurse Communication: Mobility status PT Visit Diagnosis: Muscle weakness (generalized) (M62.81);Difficulty in walking, not elsewhere classified (R26.2);Pain Pain - Right/Left: Left Pain - part of body: Hand    Time: [id number] PT Time Calculation (min) (ACUTE ONLY): 15 min   Charges:     PT Treatments $Gait Training: 8-22 mins          Brandi Harrington, PT, DPT Acute Rehabilitation Services Pager [phone] Office [phone]   Deno Etienne 02/14/2020, 11:20 AM

## 2020-02-14 NOTE — Progress Notes (Addendum)
Occupational Therapy Treatment Patient Details Name: Brandi Harrington MRN: [id number]:[id number] DOB: [birth year] Today's Date: 02/14/2020    History of present illness Pt is a 74 y/o female s/p L THA, direct anterior. PMH includes HTN, vertigo, and memory loss.    OT comments  Pt making steady progress towards OT goals this session. Pt continues to present with cognitive deficits, pain, and decreased activity tolerance impacting pts ability to engage in BADLs, however cognition and overall safety awareness appear to be the most limiting factor. Overall, pt requires MIN A for functional mobility with RW and min guard for standing grooming tasks at sink. Pt completes simulated LB dressing tasks with supervision. Feel pt more appropriate for SNF level therapies based on cognitive deficits however if pt were to DC home pt would need 24 hour support and HHOT. Will continue to follow acutely per POC.    Follow Up Recommendations  SNF;Supervision/Assistance - 24 hour;HHOT   Equipment Recommendations  3 in 1 bedside commode    Recommendations for Other Services      Precautions / Restrictions Precautions Precautions: Fall Restrictions Weight Bearing Restrictions: No LLE Weight Bearing: Weight bearing as tolerated       Mobility Bed Mobility Overal bed mobility: Modified Independent             General bed mobility comments: pt oob in chair upon arrival  Transfers Overall transfer level: Needs assistance Equipment used: Rolling walker (2 wheeled) Transfers: Sit to/from Stand Sit to Stand: Min assist         General transfer comment: MIN A for sit<>stand from recliner; cues to wait for therapist and position hands on arm rests of chair to power up    Balance Overall balance assessment: Needs assistance Sitting-balance support: Feet supported Sitting balance-Leahy Scale: Good     Standing balance support: No upper extremity supported;During functional activity Standing balance-Leahy  Scale: Fair Standing balance comment: able to complete dynamic tasks at sink with close supervision                           ADL either performed or assessed with clinical judgement   ADL Overall ADL's : Needs assistance/impaired     Grooming: Standing;Min guard;Oral care Grooming Details (indicate cue type and reason): standing at sink             Lower Body Dressing: Supervision/safety;Sitting/lateral leans Lower Body Dressing Details (indicate cue type and reason): able to reach to feet to adjust socks from recliner Toilet Transfer: Ambulation;RW;Minimal assistance;Cueing for sequencing Toilet Transfer Details (indicate cue type and reason): simulated via functional mobility with RW and min A; cues for sequencing RW mgmt         Functional mobility during ADLs: Minimal assistance;Rolling walker General ADL Comments: pt continues to present cognitive deficits, impaired balance, and pain impacting pts ability to engage in BADLs; safety awareness and cognition seem to be the most limiting factor     Vision       Perception     Praxis      Cognition Arousal/Alertness: Awake/alert Behavior During Therapy: WFL for tasks assessed/performed Overall Cognitive Status: Impaired/Different from baseline Area of Impairment: Safety/judgement;Awareness;Attention;Problem solving                   Current Attention Level: Sustained Memory: Decreased short-term memory   Safety/Judgement: Decreased awareness of safety;Decreased awareness of deficits Awareness: Intellectual Problem Solving: Difficulty sequencing;Requires verbal cues General Comments: pt initially  unaware of date but already to problem solve using phone and reports her birthday is tomorrow. pt able to problem solve getting back to room from hallway but requires increased time and verbal cues.pt able to recall correct date at end of session pt asking therapist to pour water on leg for pain.          Exercises   Shoulder Instructions       General Comments      Pertinent Vitals/ Pain       Pain Assessment: Faces Faces Pain Scale: Hurts little more(pt initially denies pain at start of session but referes to discomfort as session progresses) Pain Location: L hip; tape on hip incision Pain Descriptors / Indicators: Discomfort;Aching;Grimacing;Sore Pain Intervention(s): Limited activity within patient's tolerance;Monitored during session;Repositioned  Home Living                                          Prior Functioning/Environment              Frequency  Min 2X/week        Progress Toward Goals  OT Goals(current goals can now be found in the care plan section)  Progress towards OT goals: Progressing toward goals  Acute Rehab OT Goals Patient Stated Goal: less soreness OT Goal Formulation: With patient Time For Goal Achievement: 02/27/20 Potential to Achieve Goals: Good  Plan Discharge plan remains appropriate    Co-evaluation                 AM-PAC OT "6 Clicks" Daily Activity     Outcome Measure   Help from another person eating meals?: None Help from another person taking care of personal grooming?: A Little Help from another person toileting, which includes using toliet, bedpan, or urinal?: A Little Help from another person bathing (including washing, rinsing, drying)?: A Little Help from another person to put on and taking off regular upper body clothing?: A Little Help from another person to put on and taking off regular lower body clothing?: A Little 6 Click Score: 19    End of Session Equipment Utilized During Treatment: Gait belt;Rolling walker  OT Visit Diagnosis: Unsteadiness on feet (R26.81);Other abnormalities of gait and mobility (R26.89);Muscle weakness (generalized) (M62.81);History of falling (Z91.81);Pain Pain - Right/Left: Left Pain - part of body: Hip   Activity Tolerance Patient tolerated treatment well    Patient Left in chair;with call bell/phone within reach;with chair alarm set   Nurse Communication Mobility status        Time: [id number] OT Time Calculation (min): 18 min  Charges: OT General Charges $OT Visit: 1 Visit OT Treatments $Self Care/Home Management : 8-22 mins  Lanier Clam., COTA/L Acute Rehabilitation Services [phone] [place] 02/14/2020, 1:11 PM

## 2020-02-27 ENCOUNTER — Other Ambulatory Visit: Payer: Self-pay

## 2020-02-27 ENCOUNTER — Ambulatory Visit (INDEPENDENT_AMBULATORY_CARE_PROVIDER_SITE_OTHER): Payer: Medicare Other | Admitting: Orthopaedic Surgery

## 2020-02-27 ENCOUNTER — Encounter: Payer: Self-pay | Admitting: Orthopaedic Surgery

## 2020-02-27 DIAGNOSIS — Z96642 Presence of left artificial hip joint: Secondary | ICD-10-CM

## 2020-02-27 NOTE — Progress Notes (Signed)
Post-Op Visit Note   Patient: Brandi Harrington           Date of Birth: 1946/05/03           MRN: [id number] Visit Date: 02/27/2020 PCP: Harrison Mons, PA   Assessment & Plan:  Chief Complaint:  Chief Complaint  Patient presents with   Left Hip - Pain   Visit Diagnoses:  1. Status post total replacement of left hip     Plan: [name] is 2 weeks status post left total hip replacement.  She is doing better overall and has some pain and some muscular tightness at times.  She has finished home health PT.  Her incision is healed without any signs of infection.  Minimal swelling.  Ambulating with a walker.  Overall she is very happy at this point.  She will continue her home exercises regularly.  Continue with aspirin for DVT prophylaxis and TED hose.  Recheck in 4 weeks with standing AP pelvis.  Follow-Up Instructions: Return in about 4 weeks (around 03/26/2020).   Orders:  No orders of the defined types were placed in this encounter.  No orders of the defined types were placed in this encounter.   Imaging: No results found.  PMFS History: Patient Active Problem List   Diagnosis Date Noted   Status post total replacement of left hip 02/12/2020   Primary osteoarthritis of left hip 12/26/2019   Memory loss 02/02/2019   Undiagnosed cardiac murmurs 03/03/2017   DDD (degenerative disc disease), cervical 11/17/2016   Unilateral hearing loss, right 10/20/2016   Vertigo 10/20/2016   Tinnitus, right ear 09/12/2016   BMI 29.0-29.9,adult 06/25/2016   Facial paresthesia 02/26/2015   Lipoma of back 02/26/2015   Tobacco use 01/18/2015   Hepatitis C antibody test positive 02/15/2013   HTN (hypertension) 05/12/2012   Hyperlipidemia 05/12/2012   Osteopenia    TMJ syndrome    Vitamin D deficiency    Past Medical History:  Diagnosis Date   Hyperlipidemia    Hypertension    Left hip pain 11/09/2017   Memory loss    Osteoarthritis of hip    Osteopenia     Tinnitus    TMJ syndrome    Undiagnosed cardiac murmurs    Vertigo 09/14/2016   Vitamin D deficiency     Family History  Problem Relation Age of Onset   Diabetes Sister    Scoliosis Sister    Arthritis Sister    Alcohol abuse Daughter    Breast cancer Daughter    Heart disease Son        dysrrhythmia   Transient ischemic attack Mother    Other Father        unsure of history    Past Surgical History:  Procedure Laterality Date   COLONOSCOPY     TOTAL HIP ARTHROPLASTY Left 02/12/2020   Procedure: LEFT TOTAL HIP ARTHROPLASTY ANTERIOR APPROACH;  Surgeon: Leandrew Koyanagi, MD;  Location: [facility];  Service: Orthopedics;  Laterality: Left;   TUBAL LIGATION     Social History   Occupational History   Occupation: Retired    Fish farm manager: [employer]    Comment: [employer]  Tobacco Use   Smoking status: Current Some Day Smoker    Packs/day: 0.30    Types: Cigarettes   Smokeless tobacco: Never Used   Tobacco comment: may smoke 1 cigarette in the evening after work  Substance and Sexual Activity   Alcohol use: Not Currently    Alcohol/week: 5.0 standard  drinks    Types: 5 Cans of beer per week    Comment: 02/06/2020: patient stated drinks 2-4 beers daily   Drug use: No   Sexual activity: Yes    Partners: Male    Birth control/protection: Post-menopausal

## 2020-03-26 ENCOUNTER — Ambulatory Visit: Payer: Medicare Other | Admitting: Orthopaedic Surgery

## 2020-03-26 ENCOUNTER — Ambulatory Visit: Payer: Self-pay

## 2020-03-26 ENCOUNTER — Ambulatory Visit (INDEPENDENT_AMBULATORY_CARE_PROVIDER_SITE_OTHER): Payer: Medicare Other

## 2020-03-26 ENCOUNTER — Other Ambulatory Visit: Payer: Self-pay

## 2020-03-26 ENCOUNTER — Encounter: Payer: Self-pay | Admitting: Orthopaedic Surgery

## 2020-03-26 DIAGNOSIS — Z96642 Presence of left artificial hip joint: Secondary | ICD-10-CM

## 2020-03-26 NOTE — Progress Notes (Signed)
Post-Op Visit Note   Patient: Brandi Harrington           Date of Birth: 1946/06/09           MRN: [id number] Visit Date: 03/26/2020 PCP: Harrison Mons, PA   Assessment & Plan:  Chief Complaint:  Chief Complaint  Patient presents with   Left Hip - Routine Post Op   Visit Diagnoses:  1. Status post total replacement of left hip     Plan: Angala is 6 weeks status post left total hip replacement.  Overall doing well.  She is walking slightly hunched over with a walker.  Denies any back pain.  Surgical scar is well-healed.  No signs of infection.  X-rays are unremarkable.  I think her right hip is actually bothering her more and could be the cause of the hunched over walking.  Her left hip is doing well.  We will see her back in 6 weeks for recheck.  6 week THA follow up plan  Patient presents for follow up 6 weeks status post total hip replacement. The wound is healed and there is no evidence of infection. TED hose may be discontinued. Radiographs reveal a total hip arthroplasty in good position, with no evidence of subsidence, loosening, or complicating features. It was reinforced that with any procedure including dental work, colonoscopy, or any invasive procedure that pre-procedural prophylactic antibiotics must be taken to decrease the risk of infection. Reminders were given about signs to be aware of including redness, drainage, increased pain, fevers, calf pain, shortness of breath, or any concern should generate a phone call or a return to see [place] immediately. Will plan to follow up at 3 months postoperatively for next evaluation with radiographs at that time.   Follow-Up Instructions: Return in about 6 weeks (around 05/07/2020).   Orders:  Orders Placed This Encounter  Procedures   XR Pelvis 1-2 Views   XR Pelvis 1-2 Views   No orders of the defined types were placed in this encounter.   Imaging: XR Pelvis 1-2 Views  Result Date: 03/26/2020 Stable total hip replacement  without complications  XR Pelvis 1-2 Views  Result Date: 03/26/2020 Stable total hip replacement without complications   PMFS History: Patient Active Problem List   Diagnosis Date Noted   Status post total replacement of left hip 02/12/2020   Primary osteoarthritis of left hip 12/26/2019   Memory loss 02/02/2019   Undiagnosed cardiac murmurs 03/03/2017   DDD (degenerative disc disease), cervical 11/17/2016   Unilateral hearing loss, right 10/20/2016   Vertigo 10/20/2016   Tinnitus, right ear 09/12/2016   BMI 29.0-29.9,adult 06/25/2016   Facial paresthesia 02/26/2015   Lipoma of back 02/26/2015   Tobacco use 01/18/2015   Hepatitis C antibody test positive 02/15/2013   HTN (hypertension) 05/12/2012   Hyperlipidemia 05/12/2012   Osteopenia    TMJ syndrome    Vitamin D deficiency    Past Medical History:  Diagnosis Date   Hyperlipidemia    Hypertension    Left hip pain 11/09/2017   Memory loss    Osteoarthritis of hip    Osteopenia    Tinnitus    TMJ syndrome    Undiagnosed cardiac murmurs    Vertigo 09/14/2016   Vitamin D deficiency     Family History  Problem Relation Age of Onset   Diabetes Sister    Scoliosis Sister    Arthritis Sister    Alcohol abuse Daughter    Breast cancer Daughter  Heart disease Son        dysrrhythmia   Transient ischemic attack Mother    Other Father        unsure of history    Past Surgical History:  Procedure Laterality Date   COLONOSCOPY     TOTAL HIP ARTHROPLASTY Left 02/12/2020   Procedure: LEFT TOTAL HIP ARTHROPLASTY ANTERIOR APPROACH;  Surgeon: Leandrew Koyanagi, MD;  Location: [facility];  Service: Orthopedics;  Laterality: Left;   TUBAL LIGATION     Social History   Occupational History   Occupation: Retired    Fish farm manager: [employer]    Comment: [employer]  Tobacco Use   Smoking status: Current Some Day Smoker    Packs/day: 0.30    Types: Cigarettes   Smokeless tobacco: Never  Used   Tobacco comment: may smoke 1 cigarette in the evening after work  Scientific laboratory technician Use: Never used  Substance and Sexual Activity   Alcohol use: Not Currently    Alcohol/week: 5.0 standard drinks    Types: 5 Cans of beer per week    Comment: 02/06/2020: patient stated drinks 2-4 beers daily   Drug use: No   Sexual activity: Yes    Partners: Male    Birth control/protection: Post-menopausal

## 2020-08-11 ENCOUNTER — Encounter (HOSPITAL_COMMUNITY): Payer: Self-pay | Admitting: Emergency Medicine

## 2020-08-11 ENCOUNTER — Emergency Department (HOSPITAL_COMMUNITY): Payer: Medicare Other

## 2020-08-11 ENCOUNTER — Other Ambulatory Visit: Payer: Self-pay

## 2020-08-11 ENCOUNTER — Emergency Department (HOSPITAL_COMMUNITY)
Admission: EM | Admit: 2020-08-11 | Discharge: 2020-08-11 | Disposition: A | Discharge status: Home / Self Care | Payer: Medicare Other | Attending: Emergency Medicine | Admitting: Emergency Medicine

## 2020-08-11 DIAGNOSIS — F17210 Nicotine dependence, cigarettes, uncomplicated: Secondary | ICD-10-CM | POA: Insufficient documentation

## 2020-08-11 DIAGNOSIS — M25551 Pain in right hip: Secondary | ICD-10-CM

## 2020-08-11 DIAGNOSIS — R102 Pelvic and perineal pain: Secondary | ICD-10-CM | POA: Insufficient documentation

## 2020-08-11 DIAGNOSIS — I10 Essential (primary) hypertension: Secondary | ICD-10-CM | POA: Insufficient documentation

## 2020-08-11 DIAGNOSIS — Z79899 Other long term (current) drug therapy: Secondary | ICD-10-CM | POA: Diagnosis not present

## 2020-08-11 DIAGNOSIS — R1031 Right lower quadrant pain: Secondary | ICD-10-CM | POA: Diagnosis present

## 2020-08-11 DIAGNOSIS — Z7982 Long term (current) use of aspirin: Secondary | ICD-10-CM | POA: Diagnosis not present

## 2020-08-11 LAB — CBC WITH DIFFERENTIAL/PLATELET
Abs Immature Granulocytes: 0.02 10*3/uL (ref 0.00–0.07)
Basophils Absolute: 0.1 10*3/uL (ref 0.0–0.1)
Basophils Relative: 1 %
Eosinophils Absolute: 0.5 10*3/uL (ref 0.0–0.5)
Eosinophils Relative: 6 %
HCT: 38.5 % (ref 36.0–46.0)
Hemoglobin: 13 g/dL (ref 12.0–15.0)
Immature Granulocytes: 0 %
Lymphocytes Relative: 32 %
Lymphs Abs: 2.3 10*3/uL (ref 0.7–4.0)
MCH: 32.2 pg (ref 26.0–34.0)
MCHC: 33.8 g/dL (ref 30.0–36.0)
MCV: 95.3 fL (ref 80.0–100.0)
Monocytes Absolute: 0.7 10*3/uL (ref 0.1–1.0)
Monocytes Relative: 10 %
Neutro Abs: 3.6 10*3/uL (ref 1.7–7.7)
Neutrophils Relative %: 51 %
Platelets: 313 10*3/uL (ref 150–400)
RBC: 4.04 MIL/uL (ref 3.87–5.11)
RDW: 13.8 % (ref 11.5–15.5)
WBC: 7.2 10*3/uL (ref 4.0–10.5)
nRBC: 0 % (ref 0.0–0.2)

## 2020-08-11 LAB — URINALYSIS, ROUTINE W REFLEX MICROSCOPIC
Bilirubin Urine: NEGATIVE
Glucose, UA: NEGATIVE mg/dL
Hgb urine dipstick: NEGATIVE
Ketones, ur: NEGATIVE mg/dL
Leukocytes,Ua: NEGATIVE
Nitrite: NEGATIVE
Protein, ur: NEGATIVE mg/dL
Specific Gravity, Urine: 1.005 (ref 1.005–1.030)
pH: 6 (ref 5.0–8.0)

## 2020-08-11 LAB — BASIC METABOLIC PANEL
Anion gap: 10 (ref 5–15)
BUN: 15 mg/dL (ref 8–23)
CO2: 24 mmol/L (ref 22–32)
Calcium: 8.9 mg/dL (ref 8.9–10.3)
Chloride: 107 mmol/L (ref 98–111)
Creatinine, Ser: 1.03 mg/dL — ABNORMAL HIGH (ref 0.44–1.00)
GFR, Estimated: 58 mL/min — ABNORMAL LOW (ref >60)
Glucose, Bld: 103 mg/dL — ABNORMAL HIGH (ref 70–99)
Potassium: 4 mmol/L (ref 3.5–5.1)
Sodium: 141 mmol/L (ref 135–145)

## 2020-08-11 NOTE — ED Triage Notes (Signed)
Pt reports R groin pain that started yesterday and worsened at midnight. Also reports polyuria. Denies hematuria or hx of kidney stones. States that nothing makes the pain better or worse.

## 2020-08-11 NOTE — Discharge Instructions (Signed)
Your labs, urine and CT imaging were reassuring today.  I suspect that your pain is secondary to arthritis of your right hip.  You may take over-the-counter Tylenol 1000 mg every 6 hours as needed for pain.  Please follow-up with your PCP and orthopedic physician for further management.

## 2020-08-11 NOTE — ED Provider Notes (Signed)
TIME SEEN: 3:41 AM  CHIEF COMPLAINT: Hip pain, right lower quadrant pain  HPI: Patient is a 74 year old female with history of hypertension, hyperlipidemia, osteoarthritis presents to the emergency department with sudden onset right lower quadrant and right hip pain.  States symptoms came on at rest.  She is not sure if there are any aggravating or alleviating factors.  She has been able to ambulate.  She denies any fall or other injuries.  No increase physical activity.  No history of previous kidney stones or kidney infections.  She denies fevers, nausea, vomiting, diarrhea, dysuria or hematuria, vaginal bleeding or discharge.  ROS: See HPI Constitutional: no fever  Eyes: no drainage  ENT: no runny nose   Cardiovascular:  no chest pain  Resp: no SOB  GI: no vomiting GU: no dysuria Integumentary: no rash  Allergy: no hives  Musculoskeletal: no leg swelling  Neurological: no slurred speech ROS otherwise negative  PAST MEDICAL HISTORY/PAST SURGICAL HISTORY:  Past Medical History:  Diagnosis Date  . Hyperlipidemia   . Hypertension   . Left hip pain 11/09/2017  . Memory loss   . Osteoarthritis of hip   . Osteopenia   . Tinnitus   . TMJ syndrome   . Undiagnosed cardiac murmurs   . Vertigo 09/14/2016  . Vitamin D deficiency     MEDICATIONS:  Prior to Admission medications   Medication Sig Start Date End Date Taking? Authorizing Provider  alendronate (FOSAMAX) 70 MG tablet TAKE ONE TABLET BY MOUTH ONCE A WEEK WITH  A  FULL  GLASS  OF  WATER  ON  AN  EMPTY  STOMACH Patient taking differently: Take 70 mg by mouth once a week.  06/02/17   Harrison Mons, PA  amLODipine (NORVASC) 10 MG tablet Take 1 tablet (10 mg total) by mouth daily. 02/15/18   Harrison Mons, PA  aspirin EC 81 MG tablet Take 81 mg by mouth daily.    [provider]  aspirin EC 81 MG tablet Take 1 tablet (81 mg total) by mouth 2 (two) times daily. 02/11/20   Leandrew Koyanagi, MD  HYDROcodone-acetaminophen  (NORCO/VICODIN) 5-325 MG tablet Take 1 tablet by mouth every 6 (six) hours as needed for moderate pain. Patient not taking: Reported on 02/02/2020 08/03/19   Newt Minion, MD  lovastatin (MEVACOR) 40 MG tablet Take 1 tablet (40 mg total) by mouth at bedtime. 02/15/18   Harrison Mons, PA  meclizine (ANTIVERT) 25 MG tablet Take 1 tablet (25 mg total) by mouth 3 (three) times daily as needed for dizziness. Patient not taking: Reported on 02/02/2020 02/15/18   Harrison Mons, PA  meloxicam (MOBIC) 15 MG tablet Take 1 tablet (15 mg total) by mouth daily. 02/15/18   Harrison Mons, PA  methocarbamol (ROBAXIN) 500 MG tablet Take 1 tablet (500 mg total) by mouth every 6 (six) hours as needed for muscle spasms. 02/11/20   Leandrew Koyanagi, MD  ondansetron (ZOFRAN) 4 MG tablet Take 1-2 tablets (4-8 mg total) by mouth every 8 (eight) hours as needed for nausea or vomiting. 02/11/20   Leandrew Koyanagi, MD  oxyCODONE-acetaminophen (PERCOCET) 5-325 MG tablet Take 1-2 tablets by mouth every 8 (eight) hours as needed for severe pain. 02/11/20   Leandrew Koyanagi, MD  topiramate (TOPAMAX) 25 MG tablet Take 25 mg by mouth at bedtime. 07/21/19   [provider]    ALLERGIES:  No Known Allergies  SOCIAL HISTORY:  Social History   Tobacco Use  . Smoking  status: Current Some Day Smoker    Packs/day: 0.30    Types: Cigarettes  . Smokeless tobacco: Never Used  . Tobacco comment: may smoke 1 cigarette in the evening after work  Substance Use Topics  . Alcohol use: Not Currently    Alcohol/week: 5.0 standard drinks    Types: 5 Cans of beer per week    Comment: 02/06/2020: patient stated drinks 2-4 beers daily    FAMILY HISTORY: Family History  Problem Relation Age of Onset  . Diabetes Sister   . Scoliosis Sister   . Arthritis Sister   . Alcohol abuse Daughter   . Breast cancer Daughter   . Heart disease Son        dysrrhythmia  . Transient ischemic attack Mother   . Other Father        unsure of history     EXAM: BP (!) 121/95 (BP Location: Right Arm)   Pulse 62   Temp 98.1 F (36.7 C) (Oral)   Resp 18   Ht 5\' 1"  (1.549 m)   Wt 61.2 kg   SpO2 100%   BMI 25.51 kg/m  CONSTITUTIONAL: Alert and oriented and responds appropriately to questions. Well-appearing; well-nourished HEAD: Normocephalic EYES: Conjunctivae clear, pupils appear equal, EOM appear intact ENT: normal nose; moist mucous membranes NECK: Supple, normal ROM CARD: RRR; S1 and S2 appreciated; no murmurs, no clicks, no rubs, no gallops RESP: Normal chest excursion without splinting or tachypnea; breath sounds clear and equal bilaterally; no wheezes, no rhonchi, no rales, no hypoxia or respiratory distress, speaking full sentences ABD/GI: Normal bowel sounds; non-distended; soft, tender to palpation in the right lower quadrant, no rebound, no guarding, no peritoneal signs, no hepatosplenomegaly BACK:  The back appears normal EXT: Normal ROM in all joints; no deformity noted, no edema; no cyanosis; no calf tenderness or calf swelling, no leg asymmetry noted, 2+ DP pulses bilaterally, tender to palpation in the right inguinal area without any lymphadenopathy, redness, warmth or soft tissue swelling, patient does have some pain with internal rotation of the right hip but no pain with external rotation, flexion or extension SKIN: Normal color for age and race; warm; no rash on exposed skin NEURO: Moves all extremities equally PSYCH: The patient's mood and manner are appropriate.   MEDICAL DECISION MAKING: Patient here with sudden onset right lower quadrant pain, right hip pain.  Differential includes kidney stone, pyelonephritis, ovarian cyst, ovarian torsion, musculoskeletal pain.  She declines any pain medicine at this time.  Will check labs, urine and proceed with both x-ray of the hip as well as CT renal study.  ED PROGRESS: Patient's labs are reassuring.  CT scan shows no acute abnormality.  X-ray shows severe degenerative  changes within the right hip which I think is likely causing her pain.  Patient ambulated to the bathroom with steady gait with minimal assistance.  Continues to decline pain medicine.  Brother now at bedside stating that patient has some memory loss.  He suspects that this is her arthritis and states that she has had this before.  He states she has recently had a left hip replacement and that her orthopedist is waiting for this to heal before performing a right hip replacement.  He states that patient has done poorly with steroids and narcotics in the past.  Have recommended Tylenol as needed.  Brother comfortable with this plan.  Anticipate discharge home once urinalysis has resulted.   Urine shows no sign of infection or blood.  I  feel she is safe to be discharged home.   At this time, I do not feel there is any life-threatening condition present. I have reviewed, interpreted and discussed all results (EKG, imaging, lab, urine as appropriate) and exam findings with patient/family. I have reviewed nursing notes and appropriate previous records.  I feel the patient is safe to be discharged home without further emergent workup and can continue workup as an outpatient as needed. Discussed usual and customary return precautions. Patient/family verbalize understanding and are comfortable with this plan.  Outpatient follow-up has been provided as needed. All questions have been answered.    Brandi Harrington was evaluated in Emergency Department on 08/11/2020 for the symptoms described in the history of present illness. She was evaluated in the context of the global COVID-19 pandemic, which necessitated consideration that the patient might be at risk for infection with the SARS-CoV-2 virus that causes COVID-19. Institutional protocols and algorithms that pertain to the evaluation of patients at risk for COVID-19 are in a state of rapid change based on information released by regulatory bodies including the CDC and  federal and state organizations. These policies and algorithms were followed during the patient's care in the ED.      Lytle Malburg, Delice Bison, DO 08/11/20 [phone]

## 2021-07-07 ENCOUNTER — Other Ambulatory Visit: Payer: Self-pay | Admitting: Physician Assistant

## 2021-07-07 DIAGNOSIS — Z1231 Encounter for screening mammogram for malignant neoplasm of breast: Secondary | ICD-10-CM

## 2021-08-06 ENCOUNTER — Other Ambulatory Visit: Payer: Self-pay

## 2021-08-06 ENCOUNTER — Ambulatory Visit
Admission: RE | Admit: 2021-08-06 | Discharge: 2021-08-06 | Disposition: A | Discharge status: Home / Self Care | Payer: Medicare Other | Source: Ambulatory Visit | Attending: Physician Assistant | Admitting: Physician Assistant

## 2021-08-06 DIAGNOSIS — Z1231 Encounter for screening mammogram for malignant neoplasm of breast: Secondary | ICD-10-CM

## 2022-03-23 ENCOUNTER — Encounter (HOSPITAL_BASED_OUTPATIENT_CLINIC_OR_DEPARTMENT_OTHER): Payer: Self-pay

## 2022-03-23 ENCOUNTER — Other Ambulatory Visit: Payer: Self-pay

## 2022-03-23 ENCOUNTER — Emergency Department (HOSPITAL_BASED_OUTPATIENT_CLINIC_OR_DEPARTMENT_OTHER): Payer: Medicare Other

## 2022-03-23 ENCOUNTER — Inpatient Hospital Stay (HOSPITAL_BASED_OUTPATIENT_CLINIC_OR_DEPARTMENT_OTHER)
Admission: EM | Admit: 2022-03-23 | Discharge: 2022-03-25 | DRG: 683 | Disposition: A | Discharge status: Home / Self Care | Payer: Medicare Other | Attending: Internal Medicine | Admitting: Internal Medicine

## 2022-03-23 DIAGNOSIS — E860 Dehydration: Secondary | ICD-10-CM | POA: Diagnosis present

## 2022-03-23 DIAGNOSIS — Z79899 Other long term (current) drug therapy: Secondary | ICD-10-CM | POA: Diagnosis not present

## 2022-03-23 DIAGNOSIS — E876 Hypokalemia: Secondary | ICD-10-CM | POA: Diagnosis present

## 2022-03-23 DIAGNOSIS — F0390 Unspecified dementia without behavioral disturbance: Secondary | ICD-10-CM | POA: Diagnosis present

## 2022-03-23 DIAGNOSIS — Z7982 Long term (current) use of aspirin: Secondary | ICD-10-CM

## 2022-03-23 DIAGNOSIS — I10 Essential (primary) hypertension: Secondary | ICD-10-CM | POA: Diagnosis not present

## 2022-03-23 DIAGNOSIS — E785 Hyperlipidemia, unspecified: Secondary | ICD-10-CM | POA: Diagnosis not present

## 2022-03-23 DIAGNOSIS — N179 Acute kidney failure, unspecified: Secondary | ICD-10-CM | POA: Diagnosis not present

## 2022-03-23 DIAGNOSIS — Z6832 Body mass index (BMI) 32.0-32.9, adult: Secondary | ICD-10-CM

## 2022-03-23 DIAGNOSIS — Z791 Long term (current) use of non-steroidal anti-inflammatories (NSAID): Secondary | ICD-10-CM

## 2022-03-23 DIAGNOSIS — F17210 Nicotine dependence, cigarettes, uncomplicated: Secondary | ICD-10-CM | POA: Diagnosis present

## 2022-03-23 DIAGNOSIS — E669 Obesity, unspecified: Secondary | ICD-10-CM | POA: Diagnosis present

## 2022-03-23 DIAGNOSIS — I493 Ventricular premature depolarization: Secondary | ICD-10-CM | POA: Diagnosis not present

## 2022-03-23 DIAGNOSIS — E559 Vitamin D deficiency, unspecified: Secondary | ICD-10-CM | POA: Diagnosis present

## 2022-03-23 DIAGNOSIS — F03911 Unspecified dementia, unspecified severity, with agitation: Secondary | ICD-10-CM | POA: Diagnosis not present

## 2022-03-23 DIAGNOSIS — M26609 Unspecified temporomandibular joint disorder, unspecified side: Secondary | ICD-10-CM | POA: Diagnosis not present

## 2022-03-23 DIAGNOSIS — Z7983 Long term (current) use of bisphosphonates: Secondary | ICD-10-CM

## 2022-03-23 DIAGNOSIS — Z803 Family history of malignant neoplasm of breast: Secondary | ICD-10-CM

## 2022-03-23 DIAGNOSIS — Z96642 Presence of left artificial hip joint: Secondary | ICD-10-CM | POA: Diagnosis present

## 2022-03-23 DIAGNOSIS — Z8261 Family history of arthritis: Secondary | ICD-10-CM

## 2022-03-23 DIAGNOSIS — Z833 Family history of diabetes mellitus: Secondary | ICD-10-CM

## 2022-03-23 DIAGNOSIS — Z823 Family history of stroke: Secondary | ICD-10-CM | POA: Diagnosis not present

## 2022-03-23 LAB — CBC
HCT: 34.6 % — ABNORMAL LOW (ref 36.0–46.0)
Hemoglobin: 11.7 g/dL — ABNORMAL LOW (ref 12.0–15.0)
MCH: 31.7 pg (ref 26.0–34.0)
MCHC: 33.8 g/dL (ref 30.0–36.0)
MCV: 93.8 fL (ref 80.0–100.0)
Platelets: 327 10*3/uL (ref 150–400)
RBC: 3.69 MIL/uL — ABNORMAL LOW (ref 3.87–5.11)
RDW: 12.8 % (ref 11.5–15.5)
WBC: 6.9 10*3/uL (ref 4.0–10.5)
nRBC: 0 % (ref 0.0–0.2)

## 2022-03-23 LAB — BASIC METABOLIC PANEL
Anion gap: 12 (ref 5–15)
BUN: 33 mg/dL — ABNORMAL HIGH (ref 8–23)
CO2: 26 mmol/L (ref 22–32)
Calcium: 10 mg/dL (ref 8.9–10.3)
Chloride: 102 mmol/L (ref 98–111)
Creatinine, Ser: 2.17 mg/dL — ABNORMAL HIGH (ref 0.44–1.00)
GFR, Estimated: 23 mL/min — ABNORMAL LOW (ref >60)
Glucose, Bld: 126 mg/dL — ABNORMAL HIGH (ref 70–99)
Potassium: 2.7 mmol/L — CL (ref 3.5–5.1)
Sodium: 140 mmol/L (ref 135–145)

## 2022-03-23 LAB — CBG MONITORING, ED: Glucose-Capillary: 127 mg/dL — ABNORMAL HIGH (ref 70–99)

## 2022-03-23 LAB — POTASSIUM: Potassium: 3.7 mmol/L (ref 3.5–5.1)

## 2022-03-23 LAB — MAGNESIUM: Magnesium: 2.6 mg/dL — ABNORMAL HIGH (ref 1.7–2.4)

## 2022-03-23 MED ORDER — SODIUM CHLORIDE 0.9 % IV SOLN: INTRAVENOUS | Status: DC | PRN

## 2022-03-23 MED ORDER — POTASSIUM CHLORIDE CRYS ER 20 MEQ PO TBCR
40.0000 meq | EXTENDED_RELEASE_TABLET | Freq: Once | ORAL | Status: AC
  Administered 2022-03-23: 40 meq via ORAL
  Filled 2022-03-23: qty 2

## 2022-03-23 MED ORDER — SODIUM CHLORIDE 0.9 % IV BOLUS
1000.0000 mL | Freq: Once | INTRAVENOUS | Status: AC
  Administered 2022-03-23: 1000 mL via INTRAVENOUS

## 2022-03-23 MED ORDER — POTASSIUM CHLORIDE 10 MEQ/100ML IV SOLN
10.0000 meq | INTRAVENOUS | Status: AC
  Administered 2022-03-23 (×3): 10 meq via INTRAVENOUS
  Filled 2022-03-23 (×2): qty 100

## 2022-03-23 MED ORDER — LACTATED RINGERS IV SOLN: INTRAVENOUS | Status: DC

## 2022-03-23 MED ORDER — MELATONIN 3 MG PO TABS
3.0000 mg | ORAL_TABLET | Freq: Every day | ORAL | Status: DC
  Administered 2022-03-24: 3 mg via ORAL
  Filled 2022-03-23: qty 1

## 2022-03-23 MED ORDER — ENOXAPARIN SODIUM 30 MG/0.3ML IJ SOSY
30.0000 mg | PREFILLED_SYRINGE | Freq: Every day | INTRAMUSCULAR | Status: DC
  Administered 2022-03-24 – 2022-03-25 (×2): 30 mg via SUBCUTANEOUS
  Filled 2022-03-23 (×2): qty 0.3

## 2022-03-23 NOTE — ED Triage Notes (Signed)
Patient here POV from Home.  Endorses Sudden Onset of Headache, Blurry Vision, Dizziness that began 30-35 Minutes PTA.  History of Dementia. No Unilateral Weakness or Numbness. No Anticoagulants.   NAD Noted during Triage. A&Ox4. GCS 15. Ambulatory.

## 2022-03-23 NOTE — ED Provider Notes (Signed)
[facility] EMERGENCY DEPT Provider Note   CSN: [id number] Arrival date & time: 03/23/22  1313     History  Chief Complaint  Patient presents with   Dizziness    Brandi Harrington is a 76 y.o. female.   Dizziness  Patient is a 76 year old female with a past medical history significant for dementia, HTN on HCTZ, memory loss, HLD, vertigo  Patient is presented emergency room today with complaints of headache blurry vision and dizziness.  She describes the dizziness as lightheadedness.  She states that she had a headache that came on abruptly earlier today but seems to have resolved at this time.  She states that she still feels somewhat lightheaded when she sits up.  It seems that patient has been sometime outside today in the high heat.  Denies any vomiting or diarrhea.  She denies any chest pain or syncope.  No difficulty breathing.  She states she feels much improved now although she does feel somewhat weak.  She denies any focal weakness in any upper or lower extremity.  She denies any slurred speech confusion--apart from her baseline dementia--which her brother confirmed      Home Medications Prior to Admission medications   Medication Sig Start Date End Date Taking? Authorizing Provider  alendronate (FOSAMAX) 70 MG tablet TAKE ONE TABLET BY MOUTH ONCE A WEEK WITH  A  FULL  GLASS  OF  WATER  ON  AN  EMPTY  STOMACH Patient taking differently: Take 70 mg by mouth once a week.  06/02/17   Harrison Mons, PA  amLODipine (NORVASC) 10 MG tablet Take 1 tablet (10 mg total) by mouth daily. 02/15/18   Harrison Mons, PA  aspirin EC 81 MG tablet Take 1 tablet (81 mg total) by mouth 2 (two) times daily. 02/11/20   Leandrew Koyanagi, MD  donepezil (ARICEPT) 5 MG tablet Take 5 mg by mouth daily. 07/11/20   [provider]  HYDROcodone-acetaminophen (NORCO/VICODIN) 5-325 MG tablet Take 1 tablet by mouth every 6 (six) hours as needed for moderate pain. 08/03/19   Newt Minion,  MD  lovastatin (MEVACOR) 40 MG tablet Take 1 tablet (40 mg total) by mouth at bedtime. 02/15/18   Harrison Mons, PA  meclizine (ANTIVERT) 25 MG tablet Take 1 tablet (25 mg total) by mouth 3 (three) times daily as needed for dizziness. Patient not taking: Reported on 02/02/2020 02/15/18   Harrison Mons, PA  meloxicam (MOBIC) 15 MG tablet Take 1 tablet (15 mg total) by mouth daily. 02/15/18   Harrison Mons, PA  methocarbamol (ROBAXIN) 500 MG tablet Take 1 tablet (500 mg total) by mouth every 6 (six) hours as needed for muscle spasms. 02/11/20   Leandrew Koyanagi, MD  ondansetron (ZOFRAN) 4 MG tablet Take 1-2 tablets (4-8 mg total) by mouth every 8 (eight) hours as needed for nausea or vomiting. 02/11/20   Leandrew Koyanagi, MD  oxyCODONE-acetaminophen (PERCOCET) 5-325 MG tablet Take 1-2 tablets by mouth every 8 (eight) hours as needed for severe pain. 02/11/20   Leandrew Koyanagi, MD  topiramate (TOPAMAX) 25 MG tablet Take 25 mg by mouth at bedtime. 07/21/19   [provider]      Allergies    Patient has no known allergies.    Review of Systems   Review of Systems  Neurological:  Positive for dizziness.    Physical Exam Updated Vital Signs BP 134/72   Pulse (!) 59   Temp (!) 96.4 F (35.8 C) (Temporal)  Resp (!) 22   Ht '5\' 1"'$  (1.549 m)   Wt 61.2 kg   SpO2 100%   BMI 25.49 kg/m  Physical Exam Vitals and nursing note reviewed.  Constitutional:      General: She is not in acute distress.    Comments: Pleasant 76 year old female pleasantly demented  HENT:     Head: Normocephalic and atraumatic.     Nose: Nose normal.     Mouth/Throat:     Mouth: Mucous membranes are dry.  Eyes:     General: No scleral icterus. Cardiovascular:     Rate and Rhythm: Normal rate and regular rhythm.     Pulses: Normal pulses.     Heart sounds: Normal heart sounds.  Pulmonary:     Effort: Pulmonary effort is normal. No respiratory distress.     Breath sounds: No wheezing.  Abdominal:      Palpations: Abdomen is soft.     Tenderness: There is no abdominal tenderness. There is no guarding or rebound.  Musculoskeletal:     Cervical back: Normal range of motion.     Right lower leg: No edema.     Left lower leg: No edema.  Skin:    General: Skin is warm and dry.     Capillary Refill: Capillary refill takes less than 2 seconds.  Neurological:     Mental Status: She is alert. Mental status is at baseline.     Comments: Smile symmetric, moves all 4 extremities, sensation intact in all 4 extremities  Psychiatric:        Mood and Affect: Mood normal.        Behavior: Behavior normal.     ED Results / Procedures / Treatments   Labs (all labs ordered are listed, but only abnormal results are displayed) Labs Reviewed  BASIC METABOLIC PANEL - Abnormal; Notable for the following components:      Result Value   Potassium 2.7 (*)    Glucose, Bld 126 (*)    BUN 33 (*)    Creatinine, Ser 2.17 (*)    GFR, Estimated 23 (*)    All other components within normal limits  CBC - Abnormal; Notable for the following components:   RBC 3.69 (*)    Hemoglobin 11.7 (*)    HCT 34.6 (*)    All other components within normal limits  MAGNESIUM - Abnormal; Notable for the following components:   Magnesium 2.6 (*)    All other components within normal limits  CBG MONITORING, ED - Abnormal; Notable for the following components:   Glucose-Capillary 127 (*)    All other components within normal limits  URINALYSIS, ROUTINE W REFLEX MICROSCOPIC  POTASSIUM  BASIC METABOLIC PANEL    EKG EKG Interpretation  Date/Time:  Monday March 23 2022 13:33:27 EDT Ventricular Rate:  78 PR Interval:  158 QRS Duration: 86 QT Interval:  382 QTC Calculation: 435 R Axis:   -36 Text Interpretation: Sinus rhythm with occasional Premature ventricular complexes Left axis deviation Anteroseptal infarct (cited on or before 06-Feb-2020) Abnormal ECG When compared with ECG of 06-Feb-2020 12:01, Premature  ventricular complexes are now Present Confirmed by Madalyn Rob [id number]) on 03/23/2022 3:13:56 PM  Radiology CT Head Wo Contrast  Result Date: 03/23/2022 CLINICAL DATA:  Headache, new or worsening (Age >= 50y) acute headache started around 3 hours prior to this order placement EXAM: CT HEAD WITHOUT CONTRAST TECHNIQUE: Contiguous axial images were obtained from the base of the skull through the vertex without intravenous  contrast. RADIATION DOSE REDUCTION: This exam was performed according to the departmental dose-optimization program which includes automated exposure control, adjustment of the mA and/or kV according to patient size and/or use of iterative reconstruction technique. COMPARISON:  CT head November 10, 2018. FINDINGS: Brain: No evidence of acute infarction, hemorrhage, hydrocephalus, extra-axial collection or mass lesion/mass effect. Vascular: No hyperdense vessel identified. Skull: No acute fracture. Sinuses/Orbits: Mild paranasal sinus mucosal thickening. No acute orbital findings. Other: No mastoid effusions. IMPRESSION: No evidence of acute intracranial abnormality. Electronically Signed   By: Margaretha Sheffield M.D.   On: 03/23/2022 15:28    Procedures Procedures    Medications Ordered in ED Medications  0.9 %  sodium chloride infusion ( Intravenous New Bag/Given 03/23/22 1639)  potassium chloride SA (KLOR-CON M) CR tablet 40 mEq (40 mEq Oral Given 03/23/22 1528)  sodium chloride 0.9 % bolus 1,000 mL (0 mLs Intravenous Stopped 03/23/22 1636)  potassium chloride 10 mEq in 100 mL IVPB (10 mEq Intravenous New Bag/Given 03/23/22 1818)    ED Course/ Medical Decision Making/ A&P Clinical Course as of 03/23/22 2022  Mon Mar 23, 2022  1504 Baseline creatinine is 1 [WF]    Clinical Course User Index [WF] Tedd Sias, [place]                           Medical Decision Making Amount and/or Complexity of Data Reviewed Labs: ordered. Radiology: ordered.  Risk Prescription drug  management. Decision regarding hospitalization.   This patient presents to the ED for concern of headache, fatigue, dizziness,/lightheadedness, this involves a number of treatment options, and is a complaint that carries with it a moderate to high risk of complications and morbidity.  The differential diagnosis includes The differential diagnosis of weakness includes but is not limited to neurologic causes (GBS, myasthenia gravis, CVA, MS, ALS, transverse myelitis, spinal cord injury, CVA, botulism, ) and other causes: ACS, Arrhythmia, syncope, orthostatic hypotension, sepsis, hypoglycemia, electrolyte disturbance, hypothyroidism, respiratory failure, symptomatic anemia, dehydration, heat injury, polypharmacy, malignancy.     Co morbidities: Discussed in HPI   Brief History:  Patient is a 76 year old female with a past medical history significant for dementia, HTN on HCTZ, memory loss, HLD, vertigo  Patient is presented emergency room today with complaints of headache blurry vision and dizziness.  She describes the dizziness as lightheadedness.  She states that she had a headache that came on abruptly earlier today but seems to have resolved at this time.  She states that she still feels somewhat lightheaded when she sits up.  It seems that patient has been sometime outside today in the high heat.  Denies any vomiting or diarrhea.  She denies any chest pain or syncope.  No difficulty breathing.  She states she feels much improved now although she does feel somewhat weak.  She denies any focal weakness in any upper or lower extremity.  She denies any slurred speech confusion--apart from her baseline dementia--which her brother confirmed   Physical exam with dry oral mucosa otherwise well-appearing with no neurologic abnormalities    EMR reviewed including pt PMHx, past surgical history and past visits to ER.   See HPI for more details   Lab Tests:   I ordered and independently  interpreted labs. Labs notable for AKI with creatinine doubled from baseline.  Potassium of 2.7 magnesium actually marginally elevated.  Urinalysis still pending as patient has not provided urine sample.   Imaging Studies:  NAD. I  personally reviewed all imaging studies and no acute abnormality found. I agree with radiology interpretation. CT head unremarkable   Cardiac Monitoring:  The patient was maintained on a cardiac monitor.  I personally viewed and interpreted the cardiac monitored which showed an underlying rhythm of: Sinus bradycardia/normal sinus rhythm EKG non-ischemic no U waves noted   Medicines ordered:  I ordered medication including 1 L normal saline, p.o. potassium, IV potassium x3 ordered for dehydration hypokalemia Reevaluation of the patient after these medicines showed that the patient stayed the same I have reviewed the patients home medicines and have made adjustments as needed   Critical Interventions:     Consults/Attending Physician   I discussed this case with my attending physician who cosigned this note including patient's presenting symptoms, physical exam, and planned diagnostics and interventions. Attending physician stated agreement with plan or made changes to plan which were implemented.   Discussed with Dr. Roosevelt Locks who accepts patient for admission  Reevaluation:  After the interventions noted above I re-evaluated patient and found that they have :stayed the same   Social Determinants of Health:      Problem List / ED Course:  AKI and hypokalemia perhaps multifactorial with HCTZ on med list and high heat.  No GI losses.  Hydrating and repleted potassium here admitted to Dr. Roosevelt Locks of hospitalist   Dispostion:  After consideration of the diagnostic results and the patients response to treatment, I feel that the patent would benefit from admission  Final Clinical Impression(s) / ED Diagnoses Final diagnoses:  AKI (acute kidney  injury) [facility])  Hypokalemia    Rx / DC Orders ED Discharge Orders     None         Tedd Sias, [place] 03/23/22 2025    Lucrezia Starch, MD 03/24/22 1920

## 2022-03-23 NOTE — H&P (Signed)
History and Physical    Patient: Brandi Harrington:[id number] DOB: 01/28/1946 DOA: 03/23/2022 DOS: the patient was seen and examined on 03/24/2022 PCP: Harrison Mons, [place]  Patient coming from: Home  Chief Complaint:  Chief Complaint  Patient presents with   Dizziness   HPI: Brandi Harrington is a 76 y.o. female with medical history significant of dementia, HTN, HLD, vertigo who presents from outside ED with hypokalemia and AKI.  Patient only alert and oriented to self.  Unable to reach family over the phone.  Reportedly she lives alone but lives next door to her siblings.  History obtained from documentation.  Reportedly patient had sudden onset of headache, blurry vision and dizziness earlier today.  Has been outside in the heat.  Denies any vomiting or diarrhea.  No chest pain or shortness of breath.  No focal extremity weakness.  No blurry vision or slurred speech.  In the ED, she was afebrile normotensive and on room air.  No leukocytosis, hemoglobin of 11.7.  Potassium of 2.7 with normal magnesium at 2.6.  AKI with creatinine of 2.17 with no recent prior for comparison.  CT head was negative. Review of Systems: unable to review all systems due to the inability of the patient to answer questions. Past Medical History:  Diagnosis Date   Hyperlipidemia    Hypertension    Left hip pain 11/09/2017   Memory loss    Osteoarthritis of hip    Osteopenia    Tinnitus    TMJ syndrome    Undiagnosed cardiac murmurs    Vertigo 09/14/2016   Vitamin D deficiency    Past Surgical History:  Procedure Laterality Date   COLONOSCOPY     TOTAL HIP ARTHROPLASTY Left 02/12/2020   Procedure: LEFT TOTAL HIP ARTHROPLASTY ANTERIOR APPROACH;  Surgeon: Leandrew Koyanagi, MD;  Location: [facility];  Service: Orthopedics;  Laterality: Left;   TUBAL LIGATION     Social History:  reports that she has been smoking cigarettes. She has been smoking an average of .3 packs per day. She has never used smokeless tobacco. She  reports that she does not currently use alcohol after a past usage of about 5.0 standard drinks of alcohol per week. She reports that she does not use drugs.  No Known Allergies  Family History  Problem Relation Age of Onset   Diabetes Sister    Scoliosis Sister    Arthritis Sister    Alcohol abuse Daughter    Breast cancer Daughter    Heart disease Son        dysrrhythmia   Transient ischemic attack Mother    Other Father        unsure of history    Prior to Admission medications   Medication Sig Start Date End Date Taking? Authorizing Provider  alendronate (FOSAMAX) 70 MG tablet TAKE ONE TABLET BY MOUTH ONCE A WEEK WITH  A  FULL  GLASS  OF  WATER  ON  AN  EMPTY  STOMACH Patient taking differently: Take 70 mg by mouth once a week.  06/02/17   Harrison Mons, PA  amLODipine (NORVASC) 10 MG tablet Take 1 tablet (10 mg total) by mouth daily. 02/15/18   Harrison Mons, PA  aspirin EC 81 MG tablet Take 1 tablet (81 mg total) by mouth 2 (two) times daily. 02/11/20   Leandrew Koyanagi, MD  donepezil (ARICEPT) 5 MG tablet Take 5 mg by mouth daily. 07/11/20   [provider]  HYDROcodone-acetaminophen (NORCO/VICODIN) 5-325  MG tablet Take 1 tablet by mouth every 6 (six) hours as needed for moderate pain. 08/03/19   Newt Minion, MD  lovastatin (MEVACOR) 40 MG tablet Take 1 tablet (40 mg total) by mouth at bedtime. 02/15/18   Harrison Mons, PA  meclizine (ANTIVERT) 25 MG tablet Take 1 tablet (25 mg total) by mouth 3 (three) times daily as needed for dizziness. Patient not taking: Reported on 02/02/2020 02/15/18   Harrison Mons, PA  meloxicam (MOBIC) 15 MG tablet Take 1 tablet (15 mg total) by mouth daily. 02/15/18   Harrison Mons, PA  methocarbamol (ROBAXIN) 500 MG tablet Take 1 tablet (500 mg total) by mouth every 6 (six) hours as needed for muscle spasms. 02/11/20   Leandrew Koyanagi, MD  ondansetron (ZOFRAN) 4 MG tablet Take 1-2 tablets (4-8 mg total) by mouth every 8 (eight) hours as  needed for nausea or vomiting. 02/11/20   Leandrew Koyanagi, MD  oxyCODONE-acetaminophen (PERCOCET) 5-325 MG tablet Take 1-2 tablets by mouth every 8 (eight) hours as needed for severe pain. 02/11/20   Leandrew Koyanagi, MD  topiramate (TOPAMAX) 25 MG tablet Take 25 mg by mouth at bedtime. 07/21/19   [provider]    Physical Exam: Vitals:   03/23/22 1915 03/23/22 2040 03/23/22 2130 03/23/22 2223  BP: 134/72 (!) 128/95 139/77 (!) 158/66  Pulse: (!) 59 (!) 56 63 60  Resp: (!) '22 20 18 18  '$ Temp:   98.6 F (37 C) 98.4 F (36.9 C)  TempSrc:   Oral Oral  SpO2: 100% 100% 100% 100%  Weight:    77.2 kg  Height:    '5\' 1"'$  (1.549 m)   Constitutional: NAD, calm, comfortable, elderly female appearing younger than stated age laying asleep in bed awoke easily to voice and was pleasant.  Frequently smiling and laughing on my questions. Eyes:  lids and conjunctivae normal ENMT: Mucous membranes are moist.  Neck: normal, supple Respiratory: clear to auscultation bilaterally, no wheezing, no crackles. Normal respiratory effort. No accessory muscle use.  Cardiovascular: Regular rate and rhythm, no murmurs / rubs / gallops. No extremity edema.  Abdomen: no tenderness, no masses palpated. Bowel sounds positive.  Musculoskeletal: no clubbing / cyanosis. No joint deformity upper and lower extremities. Good ROM, no contractures. Normal muscle tone.  Skin: no rashes, lesions, ulcers.  Neurologic: CN 2-12 grossly intact.  Strength 5/5 in all 4.  Psychiatric: Dementia , alert and oriented to self although knows that she lives in [place] but is unclear where she is.  Unable to provide history.  Frequently smiling and laughing. Data Reviewed:  See HPI  Assessment and Plan: * AKI (acute kidney injury) ([place]) Creatinine of 2.17 with a remote previous creatinine of 1.03 about a year ago - Will monitor on continuous IV fluid overnight and follow - Avoid nephrotoxic agent  Hypokalemia Potassium of 2.7 on  presentation.  Suspect secondary to dehydration and being on HCTZ. - Has been given IV 10 mEq K x3 and oral 40 mEq in the ED.  Magnesium is normal. - We will follow repeat potassium in the morning  Dementia without behavioral disturbance ([place]) Continue home meds pending med rec. -Unclear baseline but currently alert and oriented only to self and reportedly has ripped out IVs in the ED -Melatonin qhs to help with rest  Hyperlipidemia Resume home meds pending med rec  HTN (hypertension) Continue amlodipine      Advance Care Planning:   Code Status: Full Code presumed full  code-unable to reach out to family to verify  Consults: None  Family Communication: Attempted to call sister and brother but unanswered.  Severity of Illness: The appropriate patient status for this patient is OBSERVATION. Observation status is judged to be reasonable and necessary in order to provide the required intensity of service to ensure the patient's safety. The patient's presenting symptoms, physical exam findings, and initial radiographic and laboratory data in the context of their medical condition is felt to place them at decreased risk for further clinical deterioration. Furthermore, it is anticipated that the patient will be medically stable for discharge from the hospital within 2 midnights of admission.   Author: Orene Desanctis, DO 03/24/2022 12:22 AM  For on call review [email or web address].

## 2022-03-23 NOTE — ED Notes (Signed)
[facility] arrived to transport pt. Pt stable at time of departure ?

## 2022-03-23 NOTE — ED Notes (Signed)
Called [facility] and s/w Maudie Mercury - advised her that patient has a bed ready at [facility], [phone]

## 2022-03-24 DIAGNOSIS — M26609 Unspecified temporomandibular joint disorder, unspecified side: Secondary | ICD-10-CM | POA: Diagnosis present

## 2022-03-24 DIAGNOSIS — Z803 Family history of malignant neoplasm of breast: Secondary | ICD-10-CM | POA: Diagnosis not present

## 2022-03-24 DIAGNOSIS — Z833 Family history of diabetes mellitus: Secondary | ICD-10-CM | POA: Diagnosis not present

## 2022-03-24 DIAGNOSIS — Z8261 Family history of arthritis: Secondary | ICD-10-CM | POA: Diagnosis not present

## 2022-03-24 DIAGNOSIS — E876 Hypokalemia: Secondary | ICD-10-CM | POA: Diagnosis present

## 2022-03-24 DIAGNOSIS — N179 Acute kidney failure, unspecified: Secondary | ICD-10-CM | POA: Diagnosis present

## 2022-03-24 DIAGNOSIS — I10 Essential (primary) hypertension: Secondary | ICD-10-CM | POA: Diagnosis present

## 2022-03-24 DIAGNOSIS — Z823 Family history of stroke: Secondary | ICD-10-CM | POA: Diagnosis not present

## 2022-03-24 DIAGNOSIS — Z79899 Other long term (current) drug therapy: Secondary | ICD-10-CM | POA: Diagnosis not present

## 2022-03-24 DIAGNOSIS — E860 Dehydration: Secondary | ICD-10-CM | POA: Diagnosis present

## 2022-03-24 DIAGNOSIS — Z7983 Long term (current) use of bisphosphonates: Secondary | ICD-10-CM | POA: Diagnosis not present

## 2022-03-24 DIAGNOSIS — Z7982 Long term (current) use of aspirin: Secondary | ICD-10-CM | POA: Diagnosis not present

## 2022-03-24 DIAGNOSIS — Z96642 Presence of left artificial hip joint: Secondary | ICD-10-CM | POA: Diagnosis present

## 2022-03-24 DIAGNOSIS — F0390 Unspecified dementia without behavioral disturbance: Secondary | ICD-10-CM

## 2022-03-24 DIAGNOSIS — I493 Ventricular premature depolarization: Secondary | ICD-10-CM | POA: Diagnosis present

## 2022-03-24 DIAGNOSIS — Z6832 Body mass index (BMI) 32.0-32.9, adult: Secondary | ICD-10-CM | POA: Diagnosis not present

## 2022-03-24 DIAGNOSIS — F17210 Nicotine dependence, cigarettes, uncomplicated: Secondary | ICD-10-CM | POA: Diagnosis present

## 2022-03-24 DIAGNOSIS — E669 Obesity, unspecified: Secondary | ICD-10-CM | POA: Diagnosis present

## 2022-03-24 DIAGNOSIS — E559 Vitamin D deficiency, unspecified: Secondary | ICD-10-CM | POA: Diagnosis present

## 2022-03-24 DIAGNOSIS — F03911 Unspecified dementia, unspecified severity, with agitation: Secondary | ICD-10-CM | POA: Diagnosis not present

## 2022-03-24 DIAGNOSIS — Z791 Long term (current) use of non-steroidal anti-inflammatories (NSAID): Secondary | ICD-10-CM | POA: Diagnosis not present

## 2022-03-24 DIAGNOSIS — E785 Hyperlipidemia, unspecified: Secondary | ICD-10-CM | POA: Diagnosis present

## 2022-03-24 LAB — CBC
HCT: 30.7 % — ABNORMAL LOW (ref 36.0–46.0)
Hemoglobin: 10.6 g/dL — ABNORMAL LOW (ref 12.0–15.0)
MCH: 32.5 pg (ref 26.0–34.0)
MCHC: 34.5 g/dL (ref 30.0–36.0)
MCV: 94.2 fL (ref 80.0–100.0)
Platelets: 257 10*3/uL (ref 150–400)
RBC: 3.26 MIL/uL — ABNORMAL LOW (ref 3.87–5.11)
RDW: 12.6 % (ref 11.5–15.5)
WBC: 8.3 10*3/uL (ref 4.0–10.5)
nRBC: 0 % (ref 0.0–0.2)

## 2022-03-24 LAB — BASIC METABOLIC PANEL
Anion gap: 10 (ref 5–15)
BUN: 31 mg/dL — ABNORMAL HIGH (ref 8–23)
CO2: 23 mmol/L (ref 22–32)
Calcium: 8.8 mg/dL — ABNORMAL LOW (ref 8.9–10.3)
Chloride: 110 mmol/L (ref 98–111)
Creatinine, Ser: 1.92 mg/dL — ABNORMAL HIGH (ref 0.44–1.00)
GFR, Estimated: 27 mL/min — ABNORMAL LOW (ref >60)
Glucose, Bld: 95 mg/dL (ref 70–99)
Potassium: 3.5 mmol/L (ref 3.5–5.1)
Sodium: 143 mmol/L (ref 135–145)

## 2022-03-24 MED ORDER — HALOPERIDOL LACTATE 5 MG/ML IJ SOLN
2.5000 mg | Freq: Once | INTRAMUSCULAR | Status: DC
Start: 2022-03-24 — End: 2022-03-24

## 2022-03-24 MED ORDER — DONEPEZIL HCL 10 MG PO TABS
10.0000 mg | ORAL_TABLET | Freq: Every morning | ORAL | Status: DC
  Administered 2022-03-25: 10 mg via ORAL
  Filled 2022-03-24: qty 1

## 2022-03-24 MED ORDER — MELATONIN 3 MG PO TABS
3.0000 mg | ORAL_TABLET | Freq: Every day | ORAL | Status: DC
  Administered 2022-03-24: 3 mg via ORAL
  Filled 2022-03-24: qty 1

## 2022-03-24 MED ORDER — AMLODIPINE BESYLATE 5 MG PO TABS
5.0000 mg | ORAL_TABLET | Freq: Every day | ORAL | Status: DC
  Administered 2022-03-24 – 2022-03-25 (×2): 5 mg via ORAL
  Filled 2022-03-24 (×2): qty 1

## 2022-03-24 MED ORDER — ASPIRIN 81 MG PO TBEC
81.0000 mg | DELAYED_RELEASE_TABLET | Freq: Every morning | ORAL | Status: DC
  Administered 2022-03-24 – 2022-03-25 (×2): 81 mg via ORAL
  Filled 2022-03-24 (×2): qty 1

## 2022-03-24 MED ORDER — HALOPERIDOL LACTATE 5 MG/ML IJ SOLN
2.5000 mg | Freq: Once | INTRAMUSCULAR | Status: AC
Start: 2022-03-24 — End: 2022-03-24
  Administered 2022-03-24: 2.5 mg via INTRAVENOUS
  Filled 2022-03-24: qty 1

## 2022-03-24 MED ORDER — TOPIRAMATE 25 MG PO TABS
25.0000 mg | ORAL_TABLET | Freq: Every morning | ORAL | Status: DC
  Administered 2022-03-24 – 2022-03-25 (×2): 25 mg via ORAL
  Filled 2022-03-24 (×2): qty 1

## 2022-03-24 MED ORDER — LACTATED RINGERS IV SOLN: INTRAVENOUS | Status: AC

## 2022-03-24 MED ORDER — ATORVASTATIN CALCIUM 40 MG PO TABS
40.0000 mg | ORAL_TABLET | Freq: Every morning | ORAL | Status: DC
  Administered 2022-03-24: 40 mg via ORAL
  Filled 2022-03-24: qty 1

## 2022-03-24 NOTE — Progress Notes (Signed)
HOSPITAL MEDICINE OVERNIGHT EVENT NOTE    Notified by nursing that patient has exhibited ongoing agitation throughout the evening, not following commands, regularly pulling out IV's and placing herself at risk of self harm.  Will administer 2.'5mg'$  IM Haldol x 1  now and reassess.   Vernelle Emerald  MD [facility]

## 2022-03-24 NOTE — Assessment & Plan Note (Signed)
Potassium of 2.7 on presentation.  Suspect secondary to dehydration and being on HCTZ. - Has been given IV 10 mEq K x3 and oral 40 mEq in the ED.  Magnesium is normal. - We will follow repeat potassium in the morning

## 2022-03-24 NOTE — Progress Notes (Signed)
Pt pulled her PIV out for the second time due to confusion (dementia). IV fluids are paused and Dr. Cyd Silence was notified

## 2022-03-24 NOTE — Assessment & Plan Note (Signed)
-   Continue amlodipine ?

## 2022-03-24 NOTE — Assessment & Plan Note (Signed)
Creatinine of 2.17 with a remote previous creatinine of 1.03 about a year ago - Will monitor on continuous IV fluid overnight and follow - Avoid nephrotoxic agent

## 2022-03-24 NOTE — Progress Notes (Signed)
Pt is confused and would not keep her cardiac monitor in place despite multiple reminders and redirections. Cardiac monitor is on standby mode, MD was notified.

## 2022-03-24 NOTE — Progress Notes (Signed)
  Progress Note   Patient: Brandi Harrington:[id number] DOB: 1946-01-22 DOA: 03/23/2022     0 DOS: the patient was seen and examined on 03/24/2022   Brief hospital course: 76 y.o. female with medical history significant of dementia, HTN, HLD, vertigo who presents from outside ED with hypokalemia and AKI in the setting of diuretics. Clinically dehydrated on exam, improving with IVF  Assessment and Plan: * AKI (acute kidney injury) ([place]) Creatinine of 2.17 with a remote previous creatinine of 1.03 about a year ago -Suspect secondary to dehydration. Pt has dry mucus membranes in setting of limited PO intake and concurrent diuretic - cont to hold HCTZ and losartan per home regimen -Cr improving, however remains clinically dehydrated on exam -Cont IVF for now, repeat bmet in AM   Hypokalemia Potassium of 2.7 on presentation.  Suspect secondary to dehydration and being on HCTZ. - replaced -Repeat bmet in AM   Dementia without behavioral disturbance ([place]) -Cont sitter as needed -Pleasantly confused, noted to have pulled off tele overnight -Melatonin qhs to help with rest   Hyperlipidemia Cont home statin   HTN (hypertension) Continue amlodipine -Hold HCTZ and losartan       Subjective: Pleasantly confused  Physical Exam: Vitals:   03/23/22 2130 03/23/22 2223 03/24/22 0516 03/24/22 0924  BP: 139/77 (!) 158/66 117/61 (!) 143/79  Pulse: 63 60 66 61  Resp: '18 18 16 18  '$ Temp: 98.6 F (37 C) 98.4 F (36.9 C) 98.1 F (36.7 C) 97.9 F (36.6 C)  TempSrc: Oral Oral  Oral  SpO2: 100% 100% 100% 100%  Weight:  77.2 kg    Height:  '5\' 1"'$  (1.549 m)     General exam: Awake, laying in bed, in nad Respiratory system: Normal respiratory effort, no wheezing Cardiovascular system: regular rate, s1, s2 Gastrointestinal system: Soft, nondistended, positive BS Central nervous system: CN2-12 grossly intact, strength intact Extremities: Perfused, no clubbing Skin: Normal skin turgor, no notable  skin lesions seen Psychiatry: Mood normal // no visual hallucinations   Data Reviewed:  Labs reviewed: K 3.5, Cr 1.92  Family Communication: Pt in room, family at bedside  Disposition: Status is: Observation The patient remains OBS appropriate and will d/c before 2 midnights.  Planned Discharge Destination: Home     Author: Marylu Lund, MD 03/24/2022 2:27 PM  For on call review [email or web address].

## 2022-03-24 NOTE — Plan of Care (Signed)
  Problem: Safety: Goal: Ability to remain free from injury will improve Outcome: Progressing   

## 2022-03-24 NOTE — Care Management Obs Status (Signed)
[facility] NOTIFICATION   Patient Details  Name: Brandi Harrington MRN: [id number] Date of Birth: [birth year]   Medicare Observation Status Notification Given:  Yes    Tom-Johnson, Renea Ee, RN 03/24/2022, 2:36 PM

## 2022-03-24 NOTE — Plan of Care (Signed)

## 2022-03-24 NOTE — Progress Notes (Signed)
Pt pulled her ultrasound guided PIV out for the third time due to confusion and memory impairment. Pt became agitated after applying mittens, which RN removed. Haldol was given but was not effective. IV fluids are paused and Dr. Cyd Silence was notified

## 2022-03-24 NOTE — Hospital Course (Signed)
76 y.o. female with medical history significant of dementia, HTN, HLD, vertigo who presents from outside ED with hypokalemia and AKI in the setting of diuretics. Clinically dehydrated on exam, improving with IVF

## 2022-03-24 NOTE — Progress Notes (Signed)
Patient removed 4th PIV due to confusion and memory impairment.  New IV placed by IV team. Family at bedside when this occurred. MD made aware.  Aurther Loft, RN

## 2022-03-24 NOTE — Assessment & Plan Note (Signed)
Continue home meds pending med rec. -Unclear baseline but currently alert and oriented only to self and reportedly has ripped out IVs in the ED -Melatonin qhs to help with rest

## 2022-03-24 NOTE — Assessment & Plan Note (Signed)
Resume home meds pending med rec

## 2022-03-25 DIAGNOSIS — N179 Acute kidney failure, unspecified: Secondary | ICD-10-CM | POA: Diagnosis not present

## 2022-03-25 DIAGNOSIS — F0390 Unspecified dementia without behavioral disturbance: Secondary | ICD-10-CM | POA: Diagnosis not present

## 2022-03-25 DIAGNOSIS — E669 Obesity, unspecified: Secondary | ICD-10-CM

## 2022-03-25 DIAGNOSIS — I10 Essential (primary) hypertension: Secondary | ICD-10-CM | POA: Diagnosis not present

## 2022-03-25 DIAGNOSIS — E876 Hypokalemia: Secondary | ICD-10-CM | POA: Diagnosis not present

## 2022-03-25 LAB — CBC
HCT: 29.1 % — ABNORMAL LOW (ref 36.0–46.0)
Hemoglobin: 10.2 g/dL — ABNORMAL LOW (ref 12.0–15.0)
MCH: 32.6 pg (ref 26.0–34.0)
MCHC: 35.1 g/dL (ref 30.0–36.0)
MCV: 93 fL (ref 80.0–100.0)
Platelets: 231 10*3/uL (ref 150–400)
RBC: 3.13 MIL/uL — ABNORMAL LOW (ref 3.87–5.11)
RDW: 12.9 % (ref 11.5–15.5)
WBC: 7.1 10*3/uL (ref 4.0–10.5)
nRBC: 0 % (ref 0.0–0.2)

## 2022-03-25 LAB — COMPREHENSIVE METABOLIC PANEL
ALT: 16 U/L (ref 0–44)
AST: 23 U/L (ref 15–41)
Albumin: 3.1 g/dL — ABNORMAL LOW (ref 3.5–5.0)
Alkaline Phosphatase: 69 U/L (ref 38–126)
Anion gap: 11 (ref 5–15)
BUN: 19 mg/dL (ref 8–23)
CO2: 23 mmol/L (ref 22–32)
Calcium: 9 mg/dL (ref 8.9–10.3)
Chloride: 108 mmol/L (ref 98–111)
Creatinine, Ser: 1.55 mg/dL — ABNORMAL HIGH (ref 0.44–1.00)
GFR, Estimated: 35 mL/min — ABNORMAL LOW (ref >60)
Glucose, Bld: 94 mg/dL (ref 70–99)
Potassium: 3.5 mmol/L (ref 3.5–5.1)
Sodium: 142 mmol/L (ref 135–145)
Total Bilirubin: 0.6 mg/dL (ref 0.3–1.2)
Total Protein: 5.9 g/dL — ABNORMAL LOW (ref 6.5–8.1)

## 2022-03-25 MED ORDER — POTASSIUM CHLORIDE CRYS ER 20 MEQ PO TBCR
40.0000 meq | EXTENDED_RELEASE_TABLET | Freq: Once | ORAL | Status: AC
  Administered 2022-03-25: 40 meq via ORAL
  Filled 2022-03-25: qty 2

## 2022-03-25 MED ORDER — AMLODIPINE BESYLATE 10 MG PO TABS: 10.0000 mg | ORAL_TABLET | Freq: Every day | ORAL | 3 refills | Status: AC

## 2022-03-25 MED ORDER — AMLODIPINE BESYLATE 10 MG PO TABS: 10.0000 mg | ORAL_TABLET | Freq: Every day | ORAL | Status: DC

## 2022-03-25 NOTE — Discharge Summary (Signed)
Physician Discharge Summary  Brandi Harrington YBO:[id number] DOB: 1945-10-27 DOA: 03/23/2022  PCP: Harrison Mons, PA  Admit date: 03/23/2022 Discharge date: 03/25/2022 Discharging to: home Recommendations for Outpatient Follow-up:  Dc HCTZ and increase Amlodipine from 5 to 10 mg Check BUN/Cr in 1-2 wks  I have discussed the discharge care plan with the patient's Sister [facility].  Consults:  none Procedures:  none   Discharge Diagnoses:   Principal Problem:   AKI (acute kidney injury) ([place]) Active Problems:   Hypokalemia   HTN (hypertension)   Hyperlipidemia   Dementia without behavioral disturbance ([facility])   Obesity (BMI 30-39.9)     Hospital Course: This is a 76 year old female who presented to the hospital on 03/23/2022 from home with headache, blurred vision and dizziness.  The patient has dementia and her brother and her sister come to her home to take care of her 24/7.  She also has a medical history of hypertension, hyperlipidemia and vertigo.   She was found to have a potassium of 2.7, a BUN of 33 and a creatinine of 2.17 in the ED.  The last creatinine we have in our computer system is from 2021 and was 1.03.  She was noted to be on hydrochlorothiazide and this was felt to be contributing to hypokalemia and dehydration resulting in a rising creatinine.  HCTZ was discontinued and the patient was given IV fluids  Principal Problem:   AKI (acute kidney injury) ([place]) -With holding HCTZ and giving IV fluids, creatinine has improved from 2.17-1.55. BUN is at 19 Active Problems: Hypokalemia - With holding HCTZ and replacing potassium, potassium has improved to 3.5 - I will give her 1 more dose of 40 mEq today prior to discharge    HTN (hypertension) -She takes amlodipine 5 mg daily which I have increased to 10 mg daily as she is still hypertensive and as we are discontinuing HCTZ - She also takes losartan which can be continued    Hyperlipidemia   Dementia without  behavioral disturbance ([place]) -Cared for by her brother and sister in her own home    Body mass index is 32.16 kg/m. -Obesity   Dementia without behavioral disturbances - Aricept continued      Discharge Instructions  Discharge Instructions     Diet - low sodium heart healthy   Complete by: As directed    Increase activity slowly   Complete by: As directed       Allergies as of 03/25/2022   No Known Allergies      Medication List     STOP taking these medications    hydrochlorothiazide 25 MG tablet Commonly known as: HYDRODIURIL   meclizine 25 MG tablet Commonly known as: ANTIVERT       TAKE these medications    alendronate 70 MG tablet Commonly known as: FOSAMAX TAKE ONE TABLET BY MOUTH ONCE A WEEK WITH  A  FULL  GLASS  OF  WATER  ON  AN  EMPTY  STOMACH What changed: See the new instructions.   amLODipine 10 MG tablet Commonly known as: NORVASC Take 1 tablet (10 mg total) by mouth daily. What changed: Another medication with the same name was removed. Continue taking this medication, and follow the directions you see here.   aspirin EC 81 MG tablet Take 1 tablet (81 mg total) by mouth 2 (two) times daily. What changed: when to take this   atorvastatin 40 MG tablet Commonly known as: LIPITOR Take 40 mg by mouth every  morning.   donepezil 10 MG tablet Commonly known as: ARICEPT Take 10 mg by mouth every morning.   losartan 100 MG tablet Commonly known as: COZAAR Take 100 mg by mouth every morning.   methocarbamol 500 MG tablet Commonly known as: ROBAXIN Take 1 tablet (500 mg total) by mouth every 6 (six) hours as needed for muscle spasms.   topiramate 25 MG tablet Commonly known as: TOPAMAX Take 25 mg by mouth every morning.        Follow-up Information     Harrison Mons, PA Follow up in 1 week(s).   Specialty: Family Medicine Why: in 1 wk, her PCP should check her BUN/Cr, potassium, BP and HR Contact information: [place] [address] [phone]                    The results of significant diagnostics from this hospitalization (including imaging, microbiology, ancillary and laboratory) are listed below for reference.    CT Head Wo Contrast  Result Date: 03/23/2022 CLINICAL DATA:  Headache, new or worsening (Age >= 50y) acute headache started around 3 hours prior to this order placement EXAM: CT HEAD WITHOUT CONTRAST TECHNIQUE: Contiguous axial images were obtained from the base of the skull through the vertex without intravenous contrast. RADIATION DOSE REDUCTION: This exam was performed according to the departmental dose-optimization program which includes automated exposure control, adjustment of the mA and/or kV according to patient size and/or use of iterative reconstruction technique. COMPARISON:  CT head November 10, 2018. FINDINGS: Brain: No evidence of acute infarction, hemorrhage, hydrocephalus, extra-axial collection or mass lesion/mass effect. Vascular: No hyperdense vessel identified. Skull: No acute fracture. Sinuses/Orbits: Mild paranasal sinus mucosal thickening. No acute orbital findings. Other: No mastoid effusions. IMPRESSION: No evidence of acute intracranial abnormality. Electronically Signed   By: Margaretha Sheffield M.D.   On: 03/23/2022 15:28   Labs:   Basic Metabolic Panel: Recent Labs  Lab 03/23/22 1349 03/23/22 2224 03/24/22 0206 03/25/22 0310  NA 140  --  143 142  K 2.7* 3.7 3.5 3.5  CL 102  --  110 108  CO2 26  --  23 23  GLUCOSE 126*  --  95 94  BUN 33*  --  31* 19  CREATININE 2.17*  --  1.92* 1.55*  CALCIUM 10.0  --  8.8* 9.0  MG 2.6*  --   --   --      CBC: Recent Labs  Lab 03/23/22 1349 03/24/22 0206 03/25/22 0310  WBC 6.9 8.3 7.1  HGB 11.7* 10.6* 10.2*  HCT 34.6* 30.7* 29.1*  MCV 93.8 94.2 93.0  PLT 327 257 231         SIGNED:   Debbe Odea, MD  [facility] 03/25/2022, 11:40 AM

## 2022-03-25 NOTE — TOC Transition Note (Addendum)
Transition of Care [facility]) - CM/SW Discharge Note   Patient Details  Name: Brandi Harrington MRN: [id number] Date of Birth: [birth year]  [facility] CM/SW Contact:  Tom-Johnson, Renea Ee, RN Phone Number: 03/25/2022, 10:47 AM   Clinical Narrative:     Patient is scheduled for discharge today. No TOC needs or recommendations noted. Family to transport at discharge. Np further TOC needs noted.   Final next level of care: Home/Self Care Barriers to Discharge: Barriers Resolved   Patient Goals and CMS Choice Patient states their goals for this hospitalization and ongoing recovery are:: To return home CMS Medicare.gov Compare Post Acute Care list provided to:: Patient Choice offered to / list presented to : NA  Discharge Placement                Patient to be transferred to facility by: Family      Discharge Plan and Services                DME Arranged: N/A DME Agency: NA       HH Arranged: NA HH Agency: NA        Social Determinants of Health (SDOH) Interventions     Readmission Risk Interventions     No data to display

## 2022-03-25 NOTE — Plan of Care (Signed)
  Problem: Education: Goal: Knowledge of General Education information will improve Description: Including pain rating scale, medication(s)/side effects and non-pharmacologic comfort measures Outcome: Adequate for Discharge   Problem: Health Behavior/Discharge Planning: Goal: Ability to manage health-related needs will improve Outcome: Adequate for Discharge   Problem: Clinical Measurements: Goal: Ability to maintain clinical measurements within normal limits will improve Outcome: Adequate for Discharge Goal: Will remain free from infection Outcome: Adequate for Discharge Goal: Diagnostic test results will improve Outcome: Adequate for Discharge Goal: Cardiovascular complication will be avoided Outcome: Adequate for Discharge   Problem: Activity: Goal: Risk for activity intolerance will decrease Outcome: Adequate for Discharge   Problem: Nutrition: Goal: Adequate nutrition will be maintained Outcome: Adequate for Discharge   Problem: Coping: Goal: Level of anxiety will decrease Outcome: Adequate for Discharge   Problem: Elimination: Goal: Will not experience complications related to bowel motility Outcome: Adequate for Discharge Goal: Will not experience complications related to urinary retention Outcome: Adequate for Discharge   Problem: Pain Managment: Goal: General experience of comfort will improve Outcome: Adequate for Discharge   Problem: Safety: Goal: Ability to remain free from injury will improve Outcome: Adequate for Discharge   Problem: Skin Integrity: Goal: Risk for impaired skin integrity will decrease Outcome: Adequate for Discharge   Problem: Safety: Goal: Non-violent Restraint(s) Outcome: Adequate for Discharge

## 2022-03-25 NOTE — Discharge Instructions (Signed)
Please review all of you discharge paperwork on the day of discharge and be sure you have all of your prescribed medications.  Please request your Primary MD to go over all Hospital Tests and Procedure/Radiological results at the follow up Please get all Hospital records sent to your primary MD by signing hospital release before you go home.   In some cases, there will be blood work, cultures and biopsy results pending at the time of your discharge. Please request that your primary care M.D. goes through all the records of your hospital data and follows up on these results.  Please take all your medications with you for your next visit with your Primary MD   Please request your Primary MD to go over all hospital tests and procedure/radiological results at the follow up, please ask your Primary MD to get all Hospital records sent to his/her office.   You must read complete instructions/literature along with all the possible adverse reactions/side effects for all the Medicines you take and that have been prescribed to you. Take any new Medicines after you have completely understood and accpet all the possible adverse reactions/side effects.    Do not drive or operate heavy machinery when taking Pain medications.    Do not take more than prescribed Pain, Sleep and Anxiety Medications  If you have smoked or chewed Tobacco  in the last 2 yrs please stop smoking, stop any regular Alcohol  and or any Recreational drug use.   Wear Seat belts while driving.   If you had Pneumonia or Lung problems at the Hospital: Please get a 2 view Chest X ray done in 6-8 weeks after hospital discharge or sooner if instructed by your Primary MD.   If you have Congestive Heart Failure: Please call your Cardiologist or Primary MD anytime you have any of the following symptoms:  1) 3 pound weight gain in 24 hours or 5 pounds in 1 week  2) shortness of breath, with or without a dry hacking cough  3) swelling in the  hands, feet or stomach  4) if you have to sleep on extra pillows at night in order to breathe 5) Follow cardiac low salt diet and 1.5 lit/day fluid restriction.   If you have Diabetes Accuchecks 4 times/day- once on AM empty stomach and then before each meal. Log in all results and show them to your primary doctor at your next visit. If any glucose reading is under 60 or above 400 call your primary MD immediately.   If you have Seizure/Convulsions/Epilepsy: Please do not drive, operate heavy machinery, participate in activities at heights or participate in high speed sports until you have seen by Primary MD or a Neurologist and advised to do so again. Per [facility] statutes, patients with seizures are not allowed to drive until they have been seizure-free for six months.  Use caution when using heavy equipment or power tools. Avoid working on ladders or at heights. Take showers instead of baths. Ensure the water temperature is not too high on the home water heater. Do not go swimming alone. Do not lock yourself in a room alone (i.e. bathroom). When caring for infants or small children, sit down when holding, feeding, or changing them to minimize risk of injury to the child in the event you have a seizure. Maintain good sleep hygiene. Avoid alcohol.    If you had Gastrointestinal Bleeding: Please ask your Primary MD to check a complete blood count within one week  of discharge or at your next visit. Your endoscopic/colonoscopic biopsies that are pending at the time of discharge, will also need to followed by your Primary MD.  Please note You were cared for by a hospitalist during your hospital stay. If you have any questions about your discharge medications or the care you received while you were in the hospital after you are discharged, you can call the unit and asked to speak with the hospitalist on call if the hospitalist that took care of you is not available. Once you are discharged,  your primary care physician will handle any further medical issues. Please note that NO REFILLS for any discharge medications will be authorized once you are discharged, as it is imperative that you return to your primary care physician (or establish a relationship with a primary care physician if you do not have one) for your aftercare needs so that they can reassess your need for medications and monitor your lab values.   You can reach the hospitalist office at phone [phone] or fax [phone]   If you do not have a primary care physician, you can call [phone] for a physician referral.

## 2022-07-01 ENCOUNTER — Other Ambulatory Visit: Payer: Self-pay | Admitting: Physician Assistant

## 2022-07-01 DIAGNOSIS — Z1231 Encounter for screening mammogram for malignant neoplasm of breast: Secondary | ICD-10-CM

## 2022-08-07 ENCOUNTER — Ambulatory Visit: Payer: Medicare Other

## 2022-10-13 ENCOUNTER — Ambulatory Visit: Payer: Medicare Other

## 2022-12-16 ENCOUNTER — Ambulatory Visit: Payer: Medicare Other

## 2023-11-09 ENCOUNTER — Encounter (HOSPITAL_COMMUNITY): Payer: Self-pay | Admitting: Emergency Medicine

## 2023-11-09 ENCOUNTER — Emergency Department (HOSPITAL_COMMUNITY): Payer: Medicare Other

## 2023-11-09 ENCOUNTER — Other Ambulatory Visit: Payer: Self-pay

## 2023-11-09 ENCOUNTER — Emergency Department (HOSPITAL_COMMUNITY)
Admission: EM | Admit: 2023-11-09 | Discharge: 2023-11-10 | Disposition: A | Discharge status: Home / Self Care | Payer: Medicare Other | Attending: Emergency Medicine | Admitting: Emergency Medicine

## 2023-11-09 DIAGNOSIS — R569 Unspecified convulsions: Secondary | ICD-10-CM | POA: Insufficient documentation

## 2023-11-09 DIAGNOSIS — Z79899 Other long term (current) drug therapy: Secondary | ICD-10-CM | POA: Diagnosis not present

## 2023-11-09 DIAGNOSIS — Z7982 Long term (current) use of aspirin: Secondary | ICD-10-CM | POA: Insufficient documentation

## 2023-11-09 LAB — CBC WITH DIFFERENTIAL/PLATELET
Abs Immature Granulocytes: 0.01 10*3/uL (ref 0.00–0.07)
Basophils Absolute: 0.1 10*3/uL (ref 0.0–0.1)
Basophils Relative: 1 %
Eosinophils Absolute: 0.1 10*3/uL (ref 0.0–0.5)
Eosinophils Relative: 2 %
HCT: 39.3 % (ref 36.0–46.0)
Hemoglobin: 13.3 g/dL (ref 12.0–15.0)
Immature Granulocytes: 0 %
Lymphocytes Relative: 25 %
Lymphs Abs: 1.5 10*3/uL (ref 0.7–4.0)
MCH: 31.8 pg (ref 26.0–34.0)
MCHC: 33.8 g/dL (ref 30.0–36.0)
MCV: 94 fL (ref 80.0–100.0)
Monocytes Absolute: 0.5 10*3/uL (ref 0.1–1.0)
Monocytes Relative: 9 %
Neutro Abs: 3.7 10*3/uL (ref 1.7–7.7)
Neutrophils Relative %: 63 %
Platelets: 340 10*3/uL (ref 150–400)
RBC: 4.18 MIL/uL (ref 3.87–5.11)
RDW: 12.9 % (ref 11.5–15.5)
WBC: 5.9 10*3/uL (ref 4.0–10.5)
nRBC: 0 % (ref 0.0–0.2)

## 2023-11-09 LAB — COMPREHENSIVE METABOLIC PANEL
ALT: 20 U/L (ref 0–44)
AST: 27 U/L (ref 15–41)
Albumin: 3.9 g/dL (ref 3.5–5.0)
Alkaline Phosphatase: 76 U/L (ref 38–126)
Anion gap: 13 (ref 5–15)
BUN: 9 mg/dL (ref 8–23)
CO2: 19 mmol/L — ABNORMAL LOW (ref 22–32)
Calcium: 9.3 mg/dL (ref 8.9–10.3)
Chloride: 108 mmol/L (ref 98–111)
Creatinine, Ser: 1.47 mg/dL — ABNORMAL HIGH (ref 0.44–1.00)
GFR, Estimated: 37 mL/min — ABNORMAL LOW (ref >60)
Glucose, Bld: 96 mg/dL (ref 70–99)
Potassium: 3.7 mmol/L (ref 3.5–5.1)
Sodium: 140 mmol/L (ref 135–145)
Total Bilirubin: 0.7 mg/dL (ref 0.0–1.2)
Total Protein: 7.6 g/dL (ref 6.5–8.1)

## 2023-11-09 LAB — CBG MONITORING, ED: Glucose-Capillary: 86 mg/dL (ref 70–99)

## 2023-11-09 MED ORDER — HALOPERIDOL LACTATE 5 MG/ML IJ SOLN
1.0000 mg | Freq: Once | INTRAMUSCULAR | Status: AC
  Administered 2023-11-09: 1 mg via INTRAVENOUS

## 2023-11-09 MED ORDER — HALOPERIDOL LACTATE 5 MG/ML IJ SOLN
1.0000 mg | Freq: Once | INTRAMUSCULAR | Status: AC
  Administered 2023-11-09: 1 mg via INTRAVENOUS
  Filled 2023-11-09: qty 1

## 2023-11-09 MED ORDER — HALOPERIDOL LACTATE 5 MG/ML IJ SOLN
2.0000 mg | Freq: Once | INTRAMUSCULAR | Status: AC
  Administered 2023-11-09: 2 mg via INTRAVENOUS
  Filled 2023-11-09: qty 1

## 2023-11-09 NOTE — ED Notes (Signed)
 Pt continuing to get OOB, PA made aware.

## 2023-11-09 NOTE — ED Notes (Signed)
 Pts family member recording staff while collecting blood. Informed family that she cannot record staff. Requested the family delete recording. Recording deleted while this RN was present at bedside. Charge RN made aware.

## 2023-11-09 NOTE — ED Provider Triage Note (Signed)
 Emergency Medicine Provider Triage Evaluation Note  Brandi Harrington , a 78 y.o. female  was evaluated in triage.  Pt complains of possible seizure.  Per EMS, event was witnessed by family lasted about a minute, states the left extremity was impacted and was twitching back-and-forth.  Reported that postictal phase was maybe 15 minutes.  She denies pain but reports head fullness.  Has a history of baseline dementia.  She does not know where she is or why she is here at this time.  No prior history of seizures.  Review of Systems  Positive: See above Negative: See above  Physical Exam  BP (!) 130/92 (BP Location: Left Arm)   Pulse 74   Temp 98.3 F (36.8 C) (Oral)   Resp 16   SpO2 98%  Gen:   Awake, no distress   Resp:  Normal effort  MSK:   Moves extremities without difficulty  Other:    Medical Decision Making  Medically screening exam initiated at 8:32 PM.  Appropriate orders placed.  Brandi Harrington was informed that the remainder of the evaluation will be completed by another provider, this initial triage assessment does not replace that evaluation, and the importance of remaining in the ED until their evaluation is complete.  Work up started   Gareth Eagle, [place] 11/09/23 2035

## 2023-11-09 NOTE — ED Provider Notes (Signed)
 [place] EMERGENCY DEPARTMENT AT [facility] Provider Note   CSN: [id number] Arrival date & time: 11/09/23  2018     History  Chief Complaint  Patient presents with   Seizures    Brandi Harrington is a 78 y.o. female.  Patient to ED by EMS after a witnessed seizure while at a family home. Per witness contacted by phone, she complained of a headache, then states she felt "hot". The family member states she then fell to the floor with eyes rolled back and started generalized shaking. After she stopped shaking she was incontinent of stool and confused for a period of time before returning to baseline. No history of seizures. No recent illness or fever. No recent change in medications.   The history is provided by a relative. No language interpreter was used.  Seizures      Home Medications Prior to Admission medications   Medication Sig Start Date End Date Taking? Authorizing Provider  alendronate (FOSAMAX) 70 MG tablet TAKE ONE TABLET BY MOUTH ONCE A WEEK WITH  A  FULL  GLASS  OF  WATER  ON  AN  EMPTY  STOMACH Patient taking differently: Take 70 mg by mouth every Sunday. 06/02/17   Porfirio Oar, PA  amLODipine (NORVASC) 10 MG tablet Take 1 tablet (10 mg total) by mouth daily. 03/25/22   Calvert Cantor, MD  aspirin EC 81 MG tablet Take 1 tablet (81 mg total) by mouth 2 (two) times daily. Patient taking differently: Take 81 mg by mouth every morning. 02/11/20   Tarry Kos, MD  atorvastatin (LIPITOR) 40 MG tablet Take 40 mg by mouth every morning. 01/14/22   [provider]  donepezil (ARICEPT) 10 MG tablet Take 10 mg by mouth every morning. 03/09/22   [provider]  losartan (COZAAR) 100 MG tablet Take 100 mg by mouth every morning. 01/08/22   [provider]  methocarbamol (ROBAXIN) 500 MG tablet Take 1 tablet (500 mg total) by mouth every 6 (six) hours as needed for muscle spasms. Patient not taking: Reported on 03/24/2022 02/11/20   Tarry Kos, MD   topiramate (TOPAMAX) 25 MG tablet Take 25 mg by mouth every morning. 07/21/19   [provider]      Allergies    Patient has no known allergies.    Review of Systems   Review of Systems  Neurological:  Positive for seizures.    Physical Exam Updated Vital Signs BP (!) 130/92 (BP Location: Left Arm)   Pulse 74   Temp 98.3 F (36.8 C) (Oral)   Resp 16   Ht 5\' 1"  (1.549 m)   Wt 77.2 kg   SpO2 98%   BMI 32.16 kg/m  Physical Exam Vitals and nursing note reviewed.  Constitutional:      General: She is not in acute distress.    Appearance: She is well-developed. She is not toxic-appearing.  HENT:     Head: Normocephalic and atraumatic.  Cardiovascular:     Rate and Rhythm: Normal rate and regular rhythm.     Heart sounds: No murmur heard. Pulmonary:     Effort: Pulmonary effort is normal.     Breath sounds: Normal breath sounds. No wheezing, rhonchi or rales.  Abdominal:     General: Bowel sounds are normal.     Palpations: Abdomen is soft.     Tenderness: There is no abdominal tenderness. There is no guarding or rebound.  Musculoskeletal:  General: Normal range of motion.     Cervical back: Normal range of motion and neck supple.  Skin:    General: Skin is warm and dry.  Neurological:     General: No focal deficit present.     Mental Status: She is alert.     Comments: Good eye contact. Follows command Confused answers to questions of orientation c/w history of dementia. Moves all extremities. Normal strength.      ED Results / Procedures / Treatments   Labs (all labs ordered are listed, but only abnormal results are displayed) Labs Reviewed  COMPREHENSIVE METABOLIC PANEL - Abnormal; Notable for the following components:      Result Value   CO2 19 (*)    Creatinine, Ser 1.47 (*)    GFR, Estimated 37 (*)    All other components within normal limits  CBC WITH DIFFERENTIAL/PLATELET  URINALYSIS, ROUTINE W REFLEX MICROSCOPIC  CBG MONITORING, ED    Results for orders placed or performed during the hospital encounter of 11/09/23  CBG monitoring, ED   Collection Time: 11/09/23  8:58 PM  Result Value Ref Range   Glucose-Capillary 86 70 - 99 mg/dL  Comprehensive metabolic panel   Collection Time: 11/09/23  9:17 PM  Result Value Ref Range   Sodium 140 135 - 145 mmol/L   Potassium 3.7 3.5 - 5.1 mmol/L   Chloride 108 98 - 111 mmol/L   CO2 19 (L) 22 - 32 mmol/L   Glucose, Bld 96 70 - 99 mg/dL   BUN 9 8 - 23 mg/dL   Creatinine, Ser 1.61 (H) 0.44 - 1.00 mg/dL   Calcium 9.3 8.9 - 09.6 mg/dL   Total Protein 7.6 6.5 - 8.1 g/dL   Albumin 3.9 3.5 - 5.0 g/dL   AST 27 15 - 41 U/L   ALT 20 0 - 44 U/L   Alkaline Phosphatase 76 38 - 126 U/L   Total Bilirubin 0.7 0.0 - 1.2 mg/dL   GFR, Estimated 37 (L) >60 mL/min   Anion gap 13 5 - 15  CBC with Differential/Platelet   Collection Time: 11/09/23  9:17 PM  Result Value Ref Range   WBC 5.9 4.0 - 10.5 K/uL   RBC 4.18 3.87 - 5.11 MIL/uL   Hemoglobin 13.3 12.0 - 15.0 g/dL   HCT 04.5 40.9 - 81.1 %   MCV 94.0 80.0 - 100.0 fL   MCH 31.8 26.0 - 34.0 pg   MCHC 33.8 30.0 - 36.0 g/dL   RDW 91.4 78.2 - 95.6 %   Platelets 340 150 - 400 K/uL   nRBC 0.0 0.0 - 0.2 %   Neutrophils Relative % 63 %   Neutro Abs 3.7 1.7 - 7.7 K/uL   Lymphocytes Relative 25 %   Lymphs Abs 1.5 0.7 - 4.0 K/uL   Monocytes Relative 9 %   Monocytes Absolute 0.5 0.1 - 1.0 K/uL   Eosinophils Relative 2 %   Eosinophils Absolute 0.1 0.0 - 0.5 K/uL   Basophils Relative 1 %   Basophils Absolute 0.1 0.0 - 0.1 K/uL   Immature Granulocytes 0 %   Abs Immature Granulocytes 0.01 0.00 - 0.07 K/uL    EKG None  Radiology No results found.  Procedures Procedures    Medications Ordered in ED Medications  haloperidol lactate (HALDOL) injection 1 mg (1 mg Intravenous Given 11/09/23 2249)  haloperidol lactate (HALDOL) injection 1 mg (1 mg Intravenous Given 11/09/23 2318)  haloperidol lactate (HALDOL) injection 2 mg (2 mg  Intravenous  Given 11/09/23 2343)    ED Course/ Medical Decision Making/ A&P Clinical Course as of 11/09/23 2344  Tue Nov 09, 2023  2337 77YO, hx dementia. Lives with daughter. Daughter  here with patient. Patient stood up to wander, wandered to uncles house. Uncle states patient came in complaining of headache. Then complaining of being hot. Had seizure activity, generalized tonic clonic. Stool incontinence after seizures stopped. No recent med changes, head injuries, no hx of seizures. Getting work up for first time seizure. Requiring haldol in order to get imaging.  [CG]  2340 As long as no seizure here, go home. Follow up with neurology.  [CG]    Clinical Course User Index [CG] Al Decant, PA-C                                 Medical Decision Making This patient presents to the ED for concern of seizure x 1, this involves an extensive number of treatment options, and is a complaint that carries with it a high risk of complications and morbidity.  The differential diagnosis includes new onset seizure disorder, mass, infection, sepsis   Co morbidities that complicate the patient evaluation  History of dementia   Additional history obtained:  Additional history and/or information obtained from chart review, notable for Sister at bedside, uncle by phone   Lab Tests:  I Ordered, and personally interpreted labs.  The pertinent results include:  no leukocytosis, normal hgb. Cr. 1.47. Normal BUN, GFR 37.     Imaging Studies ordered:  I ordered imaging studies including CT head  I independently visualized and interpreted imaging which showed pending at the time of end-of-shift sign out  Critical Interventions:  Haldol for agitation, restlessness c/w dementia Patient wandering, does not cooperate with CT testing.    Problem List / ED Course:  Patient with first time seizure by described history of family member No seizure activity in ED Patient with history of  dementia, behavior issues at night Haldol provided for attempt to get CT head obtained.    Social Determinants of Health:  Lives with daughter, cared for by multiple family members   Disposition:  Pending at the time of end-of-shift sign out.  Plan: 1st time seizure does not require medication unless she is witnessed to have another seizure here. Anticipate discharge home if head CT normal, outpatient neuro follow up. Patient care signed out to Jannifer Hick, PA-C, pending completion of work up.   Amount and/or Complexity of Data Reviewed Labs: ordered.  Risk Prescription drug management.           Final Clinical Impression(s) / ED Diagnoses Final diagnoses:  First time seizure [facility])    Rx / DC Orders ED Discharge Orders     None         Danne Harbor 11/09/23 2344    Lonell Grandchild, MD 11/10/23 1505

## 2023-11-09 NOTE — ED Triage Notes (Signed)
 Pt BIB by EMS for possible seizure activity. No prior hx. Per EMS, event witnessed by family and lasted approx. 1 minute involving the left extremity. Post-ictal for 15 minutes. Pt denies any pain but reports head fullness. No seizure activity with EMS. Dementia at baseline.

## 2023-11-09 NOTE — ED Notes (Signed)
 Pt up walking around in hall, refusing to go for CT scan. PA Melvenia Beam made aware.

## 2023-11-10 ENCOUNTER — Emergency Department (HOSPITAL_COMMUNITY): Payer: Medicare Other

## 2023-11-10 MED ORDER — LORAZEPAM 2 MG/ML IJ SOLN
1.0000 mg | Freq: Once | INTRAMUSCULAR | Status: AC
  Administered 2023-11-10: 1 mg via INTRAVENOUS
  Filled 2023-11-10: qty 1

## 2023-11-10 NOTE — ED Notes (Signed)
 Pt sitting on the edge of the bed again. Assisted back to bed by this RN and provided with warm blanket to promote rest.

## 2023-11-10 NOTE — ED Notes (Signed)
 [facility] arrived to transport pt home

## 2023-11-10 NOTE — ED Provider Notes (Signed)
  Physical Exam  BP (!) 123/58   Pulse (!) 43   Temp 98.3 F (36.8 C) (Oral)   Resp 18   Ht 5\' 1"  (1.549 m)   Wt 77.2 kg   SpO2 100%   BMI 32.16 kg/m   Physical Exam Vitals and nursing note reviewed.  Constitutional:      General: She is not in acute distress.    Appearance: She is well-developed.  HENT:     Head: Normocephalic and atraumatic.  Eyes:     Conjunctiva/sclera: Conjunctivae normal.  Pulmonary:     Effort: Pulmonary effort is normal. No respiratory distress.  Musculoskeletal:        General: No swelling.     Cervical back: Neck supple.  Skin:    General: Skin is warm and dry.     Capillary Refill: Capillary refill takes less than 2 seconds.  Neurological:     Mental Status: She is alert.  Psychiatric:        Mood and Affect: Mood normal.     Procedures  Procedures  ED Course / MDM   Clinical Course as of 11/10/23 0317  Tue Nov 09, 2023  2337 78YO, hx dementia. Lives with daughter. Daughter  here with patient. Patient stood up to wander, wandered to uncles house. Uncle states patient came in complaining of headache. Then complaining of being hot. Had seizure activity, generalized tonic clonic. Stool incontinence after seizures stopped. No recent med changes, head injuries, no hx of seizures. Getting work up for first time seizure. Requiring haldol in order to get imaging.  [CG]  2340 As long as no seizure here, go home. Follow up with neurology.  [CG]    Clinical Course User Index [CG] Al Decant, PA-C   Medical Decision Making Amount and/or Complexity of Data Reviewed Labs: ordered.  Risk Prescription drug management.   Patient received in handoff.  First-time seizure pending CT head.  Plan for discharge if CT head is normal.  CT head is reassuringly negative and patient discharged       Glendora Score, MD 11/10/23 [phone]

## 2024-07-13 ENCOUNTER — Emergency Department (HOSPITAL_COMMUNITY)
Admission: EM | Admit: 2024-07-13 | Discharge: 2024-07-13 | Disposition: A | Discharge status: Home / Self Care | Attending: Emergency Medicine | Admitting: Emergency Medicine

## 2024-07-13 ENCOUNTER — Encounter (HOSPITAL_COMMUNITY): Payer: Self-pay | Admitting: Emergency Medicine

## 2024-07-13 ENCOUNTER — Emergency Department (HOSPITAL_COMMUNITY)

## 2024-07-13 ENCOUNTER — Other Ambulatory Visit: Payer: Self-pay

## 2024-07-13 DIAGNOSIS — I10 Essential (primary) hypertension: Secondary | ICD-10-CM | POA: Insufficient documentation

## 2024-07-13 DIAGNOSIS — F0390 Unspecified dementia without behavioral disturbance: Secondary | ICD-10-CM | POA: Insufficient documentation

## 2024-07-13 DIAGNOSIS — Z7982 Long term (current) use of aspirin: Secondary | ICD-10-CM | POA: Insufficient documentation

## 2024-07-13 DIAGNOSIS — Z043 Encounter for examination and observation following other accident: Secondary | ICD-10-CM | POA: Insufficient documentation

## 2024-07-13 DIAGNOSIS — R030 Elevated blood-pressure reading, without diagnosis of hypertension: Secondary | ICD-10-CM

## 2024-07-13 DIAGNOSIS — W19XXXA Unspecified fall, initial encounter: Secondary | ICD-10-CM | POA: Insufficient documentation

## 2024-07-13 DIAGNOSIS — Z8659 Personal history of other mental and behavioral disorders: Secondary | ICD-10-CM

## 2024-07-13 DIAGNOSIS — Z79899 Other long term (current) drug therapy: Secondary | ICD-10-CM | POA: Diagnosis not present

## 2024-07-13 LAB — URINALYSIS, ROUTINE W REFLEX MICROSCOPIC
Bacteria, UA: NONE SEEN
Bilirubin Urine: NEGATIVE
Glucose, UA: >=500 mg/dL — AB
Hgb urine dipstick: NEGATIVE
Ketones, ur: NEGATIVE mg/dL
Leukocytes,Ua: NEGATIVE
Nitrite: NEGATIVE
Protein, ur: NEGATIVE mg/dL
Specific Gravity, Urine: 1.01 (ref 1.005–1.030)
pH: 7 (ref 5.0–8.0)

## 2024-07-13 LAB — CBC
HCT: 42.8 % (ref 36.0–46.0)
Hemoglobin: 13.2 g/dL (ref 12.0–15.0)
MCH: 30.5 pg (ref 26.0–34.0)
MCHC: 30.8 g/dL (ref 30.0–36.0)
MCV: 98.8 fL (ref 80.0–100.0)
Platelets: 317 K/uL (ref 150–400)
RBC: 4.33 MIL/uL (ref 3.87–5.11)
RDW: 14 % (ref 11.5–15.5)
WBC: 8.7 K/uL (ref 4.0–10.5)
nRBC: 0 % (ref 0.0–0.2)

## 2024-07-13 LAB — COMPREHENSIVE METABOLIC PANEL WITH GFR
ALT: 18 U/L (ref 0–44)
AST: 35 U/L (ref 15–41)
Albumin: 4.2 g/dL (ref 3.5–5.0)
Alkaline Phosphatase: 103 U/L (ref 38–126)
Anion gap: 12 (ref 5–15)
BUN: 14 mg/dL (ref 8–23)
CO2: 18 mmol/L — ABNORMAL LOW (ref 22–32)
Calcium: 9.6 mg/dL (ref 8.9–10.3)
Chloride: 113 mmol/L — ABNORMAL HIGH (ref 98–111)
Creatinine, Ser: 1.27 mg/dL — ABNORMAL HIGH (ref 0.44–1.00)
GFR, Estimated: 44 mL/min — ABNORMAL LOW (ref >60)
Glucose, Bld: 99 mg/dL (ref 70–99)
Potassium: 4.3 mmol/L (ref 3.5–5.1)
Sodium: 143 mmol/L (ref 135–145)
Total Bilirubin: 0.4 mg/dL (ref 0.0–1.2)
Total Protein: 7.6 g/dL (ref 6.5–8.1)

## 2024-07-13 LAB — CK: Total CK: 218 U/L (ref 38–234)

## 2024-07-13 NOTE — ED Notes (Addendum)
 PT crawled out of stretcher and attempted to walk away. Attempted to redirect and now sitting in chair. Snack given. Pt bent over and picked up paper off the floor.

## 2024-07-13 NOTE — ED Provider Notes (Signed)
 [place] EMERGENCY DEPARTMENT AT [facility] Provider Note   CSN: [id number] Arrival date & time: 07/13/24  1527     Patient presents with: Brandi Harrington is a 78 y.o. female.   Pt with c/o fall at home presents via EMS. Pt with hx advanced dementia. Family had left and returned several hours later and patient was on floor. Pts alertness and mental status reported as being c/w baseline. No loc reported. Pt very limited historian, dementia, level 5 caveat. No report of nv. No report of fevers. Pt appears to rub hip area, unclear if pain. No anticoagulant use.   The history is provided by the patient, medical records and the EMS personnel. The history is limited by the condition of the patient.  Fall       Prior to Admission medications   Medication Sig Start Date End Date Taking? Authorizing Provider  alendronate  (FOSAMAX ) 70 MG tablet TAKE ONE TABLET BY MOUTH ONCE A WEEK WITH  A  FULL  GLASS  OF  WATER   ON  AN  EMPTY  STOMACH Patient taking differently: Take 70 mg by mouth every Sunday. 06/02/17   Juliane Che, PA  amLODipine  (NORVASC ) 10 MG tablet Take 1 tablet (10 mg total) by mouth daily. 03/25/22   Rizwan, Saima, MD  aspirin  EC 81 MG tablet Take 1 tablet (81 mg total) by mouth 2 (two) times daily. Patient taking differently: Take 81 mg by mouth every morning. 02/11/20   Jerri Kay HERO, MD  atorvastatin  (LIPITOR) 40 MG tablet Take 40 mg by mouth every morning. 01/14/22   [provider]  donepezil  (ARICEPT ) 10 MG tablet Take 10 mg by mouth every morning. 03/09/22   [provider]  losartan (COZAAR) 100 MG tablet Take 100 mg by mouth every morning. 01/08/22   [provider]  methocarbamol  (ROBAXIN ) 500 MG tablet Take 1 tablet (500 mg total) by mouth every 6 (six) hours as needed for muscle spasms. Patient not taking: Reported on 03/24/2022 02/11/20   Jerri Kay HERO, MD  topiramate  (TOPAMAX ) 25 MG tablet Take 25 mg by mouth every morning.  07/21/19   [provider]    Allergies: Patient has no known allergies.    Review of Systems  Unable to perform ROS: Dementia  Constitutional:  Negative for fever.    Updated Vital Signs BP (!) 123/95 (BP Location: Right Arm)   Pulse 82   Temp 97.8 F (36.6 C) (Oral)   Resp 16   SpO2 100%   Physical Exam Vitals and nursing note reviewed.  Constitutional:      Appearance: Normal appearance. She is well-developed.  HENT:     Head: Atraumatic.     Nose: Nose normal.     Mouth/Throat:     Mouth: Mucous membranes are moist.  Eyes:     General: No scleral icterus.    Conjunctiva/sclera: Conjunctivae normal.     Pupils: Pupils are equal, round, and reactive to light.  Neck:     Vascular: No carotid bruit.     Trachea: No tracheal deviation.     Comments: Trachea midline. C collar in place. Cardiovascular:     Rate and Rhythm: Normal rate and regular rhythm.     Pulses: Normal pulses.     Heart sounds: Normal heart sounds. No murmur heard.    No friction rub. No gallop.  Pulmonary:     Effort: Pulmonary effort is normal. No respiratory distress.  Breath sounds: Normal breath sounds.  Chest:     Chest wall: No tenderness.  Abdominal:     General: Bowel sounds are normal. There is no distension.     Palpations: Abdomen is soft. There is no mass.     Tenderness: There is no abdominal tenderness. There is no guarding.     Comments: No abd bruising or contusion  Genitourinary:    Comments: No cva tenderness.  Musculoskeletal:        General: No swelling.     Cervical back: Normal range of motion and neck supple. No rigidity. No muscular tenderness.     Right lower leg: No edema.     Left lower leg: No edema.     Comments: CTLS spine, non tender, aligned, no step off. ?mild tenderness bil hips, otherwise good rom bil extremities without pain or focal bony tenderness.   Skin:    General: Skin is warm and dry.     Findings: No rash.  Neurological:      Mental Status: She is alert.     Comments: Awake and alert. Confused, hx baseline dementia. Moves bilateral extremities purposefully with good strength.   Psychiatric:        Mood and Affect: Mood normal.     (all labs ordered are listed, but only abnormal results are displayed) Results for orders placed or performed during the hospital encounter of 07/13/24  CBC   Collection Time: 07/13/24  3:54 PM  Result Value Ref Range   WBC 8.7 4.0 - 10.5 K/uL   RBC 4.33 3.87 - 5.11 MIL/uL   Hemoglobin 13.2 12.0 - 15.0 g/dL   HCT 57.1 63.9 - 53.9 %   MCV 98.8 80.0 - 100.0 fL   MCH 30.5 26.0 - 34.0 pg   MCHC 30.8 30.0 - 36.0 g/dL   RDW 85.9 88.4 - 84.4 %   Platelets 317 150 - 400 K/uL   nRBC 0.0 0.0 - 0.2 %  Comprehensive metabolic panel with GFR   Collection Time: 07/13/24  3:54 PM  Result Value Ref Range   Sodium 143 135 - 145 mmol/L   Potassium 4.3 3.5 - 5.1 mmol/L   Chloride 113 (H) 98 - 111 mmol/L   CO2 18 (L) 22 - 32 mmol/L   Glucose, Bld 99 70 - 99 mg/dL   BUN 14 8 - 23 mg/dL   Creatinine, Ser 8.72 (H) 0.44 - 1.00 mg/dL   Calcium  9.6 8.9 - 10.3 mg/dL   Total Protein 7.6 6.5 - 8.1 g/dL   Albumin 4.2 3.5 - 5.0 g/dL   AST 35 15 - 41 U/L   ALT 18 0 - 44 U/L   Alkaline Phosphatase 103 38 - 126 U/L   Total Bilirubin 0.4 0.0 - 1.2 mg/dL   GFR, Estimated 44 (L) >60 mL/min   Anion gap 12 5 - 15  Urinalysis, Routine w reflex microscopic -Urine, Catheterized   Collection Time: 07/13/24  3:54 PM  Result Value Ref Range   Color, Urine STRAW (A) YELLOW   APPearance CLEAR CLEAR   Specific Gravity, Urine 1.010 1.005 - 1.030   pH 7.0 5.0 - 8.0   Glucose, UA >=500 (A) NEGATIVE mg/dL   Hgb urine dipstick NEGATIVE NEGATIVE   Bilirubin Urine NEGATIVE NEGATIVE   Ketones, ur NEGATIVE NEGATIVE mg/dL   Protein, ur NEGATIVE NEGATIVE mg/dL   Nitrite NEGATIVE NEGATIVE   Leukocytes,Ua NEGATIVE NEGATIVE   RBC / HPF 0-5 0 - 5 RBC/hpf   WBC,  UA 0-5 0 - 5 WBC/hpf   Bacteria, UA NONE SEEN NONE SEEN    Squamous Epithelial / HPF 0-5 0 - 5 /HPF  CK   Collection Time: 07/13/24  3:54 PM  Result Value Ref Range   Total CK 218 38 - 234 U/L     EKG: None  Radiology: DG Hips Bilat W or Wo Pelvis 3-4 Views Result Date: 07/13/2024 CLINICAL DATA:  Fell at home, hip pain EXAM: DG HIP (WITH OR WITHOUT PELVIS) 3-4V BILAT COMPARISON:  08/11/2020 FINDINGS: Frontal views of the pelvis as well as frontal and cross-table lateral views of the left hip are obtained. Left hip arthroplasty is identified in stable position with no evidence of orthopedic hardware failure or loosening. Progressive severe right hip osteoarthritis. No acute displaced fracture. Lower lumbar spondylosis and facet hypertrophy. Sacroiliac joints are unremarkable. IMPRESSION: 1. No acute displaced fracture. 2. Unremarkable left hip arthroplasty. 3. Degenerative changes of the lumbar spine and right hip, which have progressed since prior study. Electronically Signed   By: Ozell Daring M.D.   On: 07/13/2024 16:37   CT Head Wo Contrast Result Date: 07/13/2024 EXAM: CT HEAD WITHOUT CONTRAST 07/13/2024 04:20:26 PM TECHNIQUE: CT of the head was performed without the administration of intravenous contrast. Automated exposure control, iterative reconstruction, and/or weight based adjustment of the mA/kV was utilized to reduce the radiation dose to as low as reasonably achievable. COMPARISON: Head CT 11/10/2023 and MRI 02/15/2019. CLINICAL HISTORY: Minor head trauma (age >= 65y), reports of fall. FINDINGS: BRAIN AND VENTRICLES: There is no evidence of an acute infarct, intracranial hemorrhage, mass, midline shift, hydrocephalus, or extra-axial fluid collection. Cerebral atrophy is within normal limits for age. Calcified atherosclerosis at the skull base. ORBITS: No acute abnormality. SINUSES: Mild mucosal thickening in the paranasal sinuses. Clear mastoid air cells. SOFT TISSUES AND SKULL: No acute soft tissue abnormality. No skull fracture.  IMPRESSION: 1. No acute intracranial abnormality. Electronically signed by: Dasie Hamburg MD 07/13/2024 04:33 PM EDT RP Workstation: [id number]   CT Cervical Spine Wo Contrast Result Date: 07/13/2024 EXAM: CT CERVICAL SPINE WITHOUT CONTRAST 07/13/2024 04:20:26 PM TECHNIQUE: CT of the cervical spine was performed without the administration of intravenous contrast. Multiplanar reformatted images are provided for review. Automated exposure control, iterative reconstruction, and/or weight based adjustment of the mA/kV was utilized to reduce the radiation dose to as low as reasonably achievable. COMPARISON: None available. CLINICAL HISTORY: Neck trauma, fall, age >= 11y. Patient would not lie still for optimal imaging despite CT straps. FINDINGS: LIMITATIONS: The examination is moderately motion degraded despite repeat imaging. CERVICAL SPINE: BONES AND ALIGNMENT: Cervical spine straightening. Trace anterolisthesis of C3 on C4, likely degenerative. Mild right convex curvature of the cervical spine and left convex curvature of the included upper thoracic spine. No acute fracture is identified, with motion artifact decreasing sensitivity for detection of nondisplaced fractures. No destructive process. DEGENERATIVE CHANGES: Cervical spondylosis including bulky anterior vertebral spurring at C4-C5. Severe disc space narrowing and degenerative endplate changes at C5-C6, C7-T1, and T1-T2 and moderate narrowing at C4-C5 and C2-C3. Asymmetrically advanced left sided facet arthrosis in the upper cervical spine and near the cervicothoracic junction. Advanced left neural foraminal stenosis at C2-C3. No gross high grade spinal canal stenosis. SOFT TISSUES: No prevertebral soft tissue swelling. IMPRESSION: 1. No acute cervical spine fracture identified with the limitations of motion artifact. 2. Advanced multilevel disc and facet degeneration. Electronically signed by: Dasie Hamburg MD 07/13/2024 04:30 PM EDT RP Workstation:  [id number]  Procedures   Medications Ordered in the ED - No data to display                                  Medical Decision Making Problems Addressed: Accidental fall, initial encounter: acute illness or injury with systemic symptoms that poses a threat to life or bodily functions Elevated blood pressure reading: acute illness or injury Essential hypertension: chronic illness or injury with exacerbation, progression, or side effects of treatment that poses a threat to life or bodily functions History of dementia: chronic illness or injury that poses a threat to life or bodily functions  Amount and/or Complexity of Data Reviewed Independent Historian: EMS    Details: hx External Data Reviewed: notes. Labs: ordered. Decision-making details documented in ED Course. Radiology: ordered and independent interpretation performed. Decision-making details documented in ED Course. ECG/medicine tests: ordered and independent interpretation performed. Decision-making details documented in ED Course.  Risk Decision regarding hospitalization.   Iv ns. Continuous pulse ox and cardiac monitoring. Labs ordered/sent. Imaging ordered.   Differential diagnosis includes sdh, traumatic injury, fracture, contusion, etc. Dispo decision including potential need for admission considered - will get labs and imaging and reassess.   Reviewed nursing notes and prior charts for additional history. External reports reviewed. Additional history from: EMS.   Cardiac monitor: sinus rhythm, rate 80.  Labs reviewed/interpreted by me - wbc and hgb normal. Chem largely unremarkable ?mild dehydration - pt is taking fluids well, and eating well in ED.  Ua neg for uti.  Ck normal.   Xrays reviewed/interpreted by me - no fx.   CT reviewed/interpreted by me - no hem.   Family member/rn relay desire for hh eval - ordered.   Recheck, pt comfortable, no pain or distress. Ambulatory with pain or discomfort,  steady.   Pt currently appears stable for ed d/c.   Rec close pcp f/u.  Return precautions provided.       Final diagnoses:  Accidental fall, initial encounter  History of dementia  Elevated blood pressure reading  Essential hypertension    ED Discharge Orders     None          Bernard Drivers, MD 07/13/24 1845

## 2024-07-13 NOTE — Discharge Instructions (Signed)
 It was our pleasure to provide your ER care today - we hope that you feel better.  Fall precautions.   Follow up closely with primary care doctor in the coming week for recheck. Also follow up then for recheck of blood pressure as it is high today.   Return to ER if worse, new symptoms, fevers, new/severe pain, chest pain, trouble breathing, severe abdominal pain, persistent vomiting, or other concern.

## 2024-07-13 NOTE — ED Notes (Addendum)
 [phone] Mallissa Childes (Brother)

## 2024-07-13 NOTE — ED Notes (Signed)
Pt removed her own C-Collar

## 2024-07-13 NOTE — ED Triage Notes (Signed)
 Pt from home via [facility] with reports of fall. Per family pt was fed breakfast this morning and that was the last time family saw pt. She was found on the ground at 1500 this afternoon. Pt denies pain. No blood thinner use.

## 2024-07-13 NOTE — ED Notes (Signed)
 Pt is up ambulating independently all over unit. Staff are monitoring her and redirecting her. She refuses to sit while waiting for family.

## 2024-07-13 NOTE — ED Notes (Signed)
 Son here to pick up patient. Pt ambulated to exit with her son. No signs of distress. Jewelry returned to son.
